# Patient Record
Sex: Female | Born: 1970 | Race: White | Hispanic: No | Marital: Married | Smoking: Never smoker
Health system: Southern US, Community
[De-identification: ages and names within clinical notes are randomized; demographics above are authoritative.]

## PROBLEM LIST (undated history)

## (undated) DIAGNOSIS — F3281 Premenstrual dysphoric disorder: Secondary | ICD-10-CM

## (undated) DIAGNOSIS — K589 Irritable bowel syndrome without diarrhea: Secondary | ICD-10-CM

## (undated) DIAGNOSIS — R928 Other abnormal and inconclusive findings on diagnostic imaging of breast: Secondary | ICD-10-CM

## (undated) DIAGNOSIS — R42 Dizziness and giddiness: Secondary | ICD-10-CM

## (undated) DIAGNOSIS — G43009 Migraine without aura, not intractable, without status migrainosus: Secondary | ICD-10-CM

## (undated) DIAGNOSIS — K219 Gastro-esophageal reflux disease without esophagitis: Secondary | ICD-10-CM

## (undated) DIAGNOSIS — R112 Nausea with vomiting, unspecified: Secondary | ICD-10-CM

## (undated) DIAGNOSIS — T7840XA Allergy, unspecified, initial encounter: Secondary | ICD-10-CM

## (undated) DIAGNOSIS — J302 Other seasonal allergic rhinitis: Secondary | ICD-10-CM

## (undated) DIAGNOSIS — N83209 Unspecified ovarian cyst, unspecified side: Secondary | ICD-10-CM

## (undated) DIAGNOSIS — H8109 Meniere's disease, unspecified ear: Secondary | ICD-10-CM

## (undated) DIAGNOSIS — R1013 Epigastric pain: Secondary | ICD-10-CM

## (undated) DIAGNOSIS — D509 Iron deficiency anemia, unspecified: Secondary | ICD-10-CM

## (undated) DIAGNOSIS — Z9889 Other specified postprocedural states: Secondary | ICD-10-CM

## (undated) HISTORY — DX: Premenstrual dysphoric disorder: F32.81

## (undated) HISTORY — DX: Migraine without aura, not intractable, without status migrainosus: G43.009

## (undated) HISTORY — PX: WISDOM TOOTH EXTRACTION: SHX21

## (undated) HISTORY — DX: Irritable bowel syndrome, unspecified: K58.9

## (undated) HISTORY — DX: Gastro-esophageal reflux disease without esophagitis: K21.9

## (undated) HISTORY — DX: Allergy, unspecified, initial encounter: T78.40XA

## (undated) HISTORY — DX: Dizziness and giddiness: R42

## (undated) HISTORY — PX: COLONOSCOPY: SHX174

## (undated) HISTORY — DX: Iron deficiency anemia, unspecified: D50.9

## (undated) HISTORY — DX: Unspecified ovarian cyst, unspecified side: N83.209

## (undated) HISTORY — PX: NASAL SINUS SURGERY: SHX719

## (undated) HISTORY — DX: Other seasonal allergic rhinitis: J30.2

## (undated) HISTORY — DX: Meniere's disease, unspecified ear: H81.09

---

## 1992-07-11 LAB — HM COLONOSCOPY

## 1995-07-12 HISTORY — PX: LEEP: SHX91

## 1999-07-12 DIAGNOSIS — H8109 Meniere's disease, unspecified ear: Secondary | ICD-10-CM

## 1999-07-12 HISTORY — DX: Meniere's disease, unspecified ear: H81.09

## 2002-07-11 HISTORY — PX: TUBAL LIGATION: SHX77

## 2004-07-11 HISTORY — PX: OTHER SURGICAL HISTORY: SHX169

## 2005-07-11 HISTORY — PX: MANDIBLE SURGERY: SHX707

## 2007-07-12 LAB — HIV ANTIBODY (ROUTINE TESTING W REFLEX): HIV 1&2 Ab, 4th Generation: NEGATIVE

## 2010-03-09 ENCOUNTER — Emergency Department (HOSPITAL_COMMUNITY)
Admission: EM | Admit: 2010-03-09 | Discharge: 2010-03-09 | Payer: Self-pay | Source: Home / Self Care | Admitting: Family Medicine

## 2010-05-06 ENCOUNTER — Emergency Department (HOSPITAL_COMMUNITY): Admission: EM | Admit: 2010-05-06 | Discharge: 2010-05-06 | Payer: Self-pay | Admitting: Family Medicine

## 2010-06-22 ENCOUNTER — Emergency Department (HOSPITAL_COMMUNITY)
Admission: EM | Admit: 2010-06-22 | Discharge: 2010-06-22 | Payer: Self-pay | Source: Home / Self Care | Admitting: Family Medicine

## 2010-07-15 ENCOUNTER — Emergency Department (HOSPITAL_COMMUNITY)
Admission: EM | Admit: 2010-07-15 | Discharge: 2010-07-15 | Payer: Self-pay | Source: Home / Self Care | Admitting: Family Medicine

## 2010-07-27 ENCOUNTER — Emergency Department (HOSPITAL_COMMUNITY)
Admission: EM | Admit: 2010-07-27 | Discharge: 2010-07-27 | Payer: Self-pay | Source: Home / Self Care | Admitting: Family Medicine

## 2010-09-24 LAB — CULTURE, ROUTINE-ABSCESS

## 2010-10-13 ENCOUNTER — Inpatient Hospital Stay (INDEPENDENT_AMBULATORY_CARE_PROVIDER_SITE_OTHER)
Admission: RE | Admit: 2010-10-13 | Discharge: 2010-10-13 | Disposition: A | Discharge status: Home / Self Care | Payer: Self-pay | Source: Ambulatory Visit | Attending: Emergency Medicine | Admitting: Emergency Medicine

## 2010-10-13 DIAGNOSIS — J019 Acute sinusitis, unspecified: Secondary | ICD-10-CM

## 2012-05-24 ENCOUNTER — Ambulatory Visit: Payer: Self-pay | Admitting: Family Medicine

## 2012-05-24 ENCOUNTER — Encounter: Payer: Self-pay | Admitting: Family Medicine

## 2012-05-24 ENCOUNTER — Ambulatory Visit (INDEPENDENT_AMBULATORY_CARE_PROVIDER_SITE_OTHER): Admitting: Family Medicine

## 2012-05-24 VITALS — BP 132/84 | HR 80 | Temp 97.5°F | Ht 65.0 in | Wt 139.0 lb

## 2012-05-24 DIAGNOSIS — K2970 Gastritis, unspecified, without bleeding: Secondary | ICD-10-CM

## 2012-05-24 DIAGNOSIS — Z23 Encounter for immunization: Secondary | ICD-10-CM

## 2012-05-24 DIAGNOSIS — R1013 Epigastric pain: Secondary | ICD-10-CM

## 2012-05-24 DIAGNOSIS — K219 Gastro-esophageal reflux disease without esophagitis: Secondary | ICD-10-CM

## 2012-05-24 DIAGNOSIS — K2990 Gastroduodenitis, unspecified, without bleeding: Secondary | ICD-10-CM

## 2012-05-24 MED ORDER — DEXLANSOPRAZOLE 60 MG PO CPDR: 60.0000 mg | DELAYED_RELEASE_CAPSULE | Freq: Every day | ORAL | Status: DC

## 2012-05-24 NOTE — Progress Notes (Signed)
Chief Complaint  Patient presents with  . Abdominal Pain    upper abdominal pain x 3 months. Eating anything spicy worsens this pain. Burning and painful-keeps her up at night. Sometimes she is doubled over in pain-nauseated.   HPI:  Started with upper abdominal pain about 3 months ago.  Started with pain during the day--didn't really seem to matter if she ate or not.  Subsequently started having more pain at night. Pain is described as a burning, cramping. There is associated nausea.  Pain has gotten worse in the past few weeks.  Only feels better if she gets on the floor and curls up into a ball, lies there until pain subsides.  She has taken a lot of Tums, sometimes it helps.  Denies heartburn.  Pain is above belly button, doesn't radiate elsewhere.  She had been using ibuprofen 600mg  twice daily for sinus pain, for months.  She thought this might be contributing, so cut back on taking ibuprofen, but no improvement in GI symptoms.   Denies any bloody or black BM's. Sometimes notices some mucus in stool, some constipation.  Pain is worse after tomato sauce, pizza.  She drinks a lot of orange-flavored water every day.  Past Medical History  Diagnosis Date  . Seasonal allergies   . PMDD (premenstrual dysphoric disorder)    Past Surgical History  Procedure Date  . Cesarean section     x2  . Nasal sinus surgery     x3  . Mandible surgery 2007  . Tubal ligation   . Wisdom tooth extraction   . Leep 1997   History   Social History  . Marital Status: Married    Spouse Name: N/A    Number of Children: 3  . Years of Education: N/A   Occupational History  . office assistant    Social History Main Topics  . Smoking status: Never Smoker   . Smokeless tobacco: Never Used  . Alcohol Use: No  . Drug Use: No  . Sexually Active: Yes -- Female partner(s)    Birth Control/ Protection: Surgical   Other Topics Concern  . Not on file   Social History Narrative   Divorced and re-married.   Lives with 3 children, husband.  No pets.  No tobacco exposure   Family History  Problem Relation Age of Onset  . Diabetes Mother   . Hypertension Mother   . Irritable bowel syndrome Mother   . Alzheimer's disease Father 72  . Hypertension Maternal Grandmother   . Hyperlipidemia Maternal Grandmother   . Stroke Maternal Grandfather   . Diabetes Paternal Grandmother   . Alzheimer's disease Paternal Grandmother   . Heart disease Paternal Grandfather 75    died of MI   Current outpatient prescriptions:BIOTIN PO, Take 3 capsules by mouth daily., Disp: , Rfl: ;  fexofenadine-pseudoephedrine (ALLEGRA-D 24) 180-240 MG per 24 hr tablet, Take 1 tablet by mouth daily., Disp: , Rfl: ;  fluticasone (FLONASE) 50 MCG/ACT nasal spray, Place 2 sprays into the nose daily., Disp: , Rfl: ;  ibuprofen (ADVIL,MOTRIN) 200 MG tablet, Take 200 mg by mouth every 6 (six) hours as needed., Disp: , Rfl:  Lactobacillus Rhamnosus, GG, (CULTURELLE PO), Take 1 capsule by mouth daily., Disp: , Rfl: ;  sertraline (ZOLOFT) 100 MG tablet, Take 100 mg by mouth daily., Disp: , Rfl:   Allergies  Allergen Reactions  . Sulfa Antibiotics Rash   ROS:  Denies fevers.  Just got over a sinus infection--currently denies pain,  purulence.  Denies sore throat, cough, shortness of breath, wheezing.  Denies chest pain, palpitations. Denies dysphagia. Denies radiation of abdominal pain.  Denies dysuria, vaginal discharge, joint pains. Denies bloody, or black stools, no mucus in stools.  No skin rashes or other concerns.  Moods controlled with current regimen  PHYSICAL EXAM: BP 132/84  Pulse 80  Temp 97.5 F (36.4 C) (Oral)  Ht 5\' 5"  (1.651 m)  Wt 139 lb (63.05 kg)  BMI 23.13 kg/m2  LMP 05/16/2012 Well developed, pleasant female in no distress HEENT: PERRL, conjunctiva clear.  OP clear Neck: no lymphadenopathy, thyromegaly or mass Heart: regular rate and rhythm without murmur Lungs: clear bilaterally Abdomen: soft, normal bowel  sounds.  Epigastric tenderness.  No hepatosplenomegaly or mass, negative Murphy sign. Extremities: no edema Neuro: alert and oriented, cranial nerves grossly intact.  Normal gait Skin: no rash Psych: normal mood, affect, hygiene and grooming  ASSESSMENT/PLAN:   1. Epigastric pain  dexlansoprazole (DEXILANT) 60 MG capsule  2. Need for prophylactic vaccination and inoculation against influenza  Flu vaccine greater than or equal to 3yo preservative free IM  3. Gastritis    4. GERD (gastroesophageal reflux disease)      Reviewed proper diet, reflux precautions. Stop NSAIDs, tylenol prn.  Reviewed if she is having sinus pain, to try sinus rinses, decongestants and tylenol rather than ibuprofen Given samples of Dexilant x 20 days.  Complete all samples, then try OTC Prilosec if symptoms recur after stopping med. If symptoms recur but aren't controlled with OTC meds, call for rx (and copay card) for dexilant (if it was effective).  F/u 2-3 weeks, sooner prn.  Schedule CPE

## 2012-05-24 NOTE — Patient Instructions (Signed)
Diet for Gastroesophageal Reflux Disease, Adult Reflux (acid reflux) is when acid from your stomach flows up into the esophagus. When acid comes in contact with the esophagus, the acid causes irritation and soreness (inflammation) in the esophagus. When reflux happens often or so severely that it causes damage to the esophagus, it is called gastroesophageal reflux disease (GERD). Nutrition therapy can help ease the discomfort of GERD. FOODS OR DRINKS TO AVOID OR LIMIT  Smoking or chewing tobacco. Nicotine is one of the most potent stimulants to acid production in the gastrointestinal tract.  Caffeinated and decaffeinated coffee and black tea.  Regular or low-calorie carbonated beverages or energy drinks (caffeine-free carbonated beverages are allowed).   Strong spices, such as black pepper, white pepper, red pepper, cayenne, curry powder, and chili powder.  Peppermint or spearmint.  Chocolate.  High-fat foods, including meats and fried foods. Extra added fats including oils, butter, salad dressings, and nuts. Limit these to less than 8 tsp per day.  Fruits and vegetables if they are not tolerated, such as citrus fruits or tomatoes.  Alcohol.  Any food that seems to aggravate your condition. If you have questions regarding your diet, call your caregiver or a registered dietitian. OTHER THINGS THAT MAY HELP GERD INCLUDE:   Eating your meals slowly, in a relaxed setting.  Eating 5 to 6 small meals per day instead of 3 large meals.  Eliminating food for a period of time if it causes distress.  Not lying down until 3 hours after eating a meal.  Keeping the head of your bed raised 6 to 9 inches (15 to 23 cm) by using a foam wedge or blocks under the legs of the bed. Lying flat may make symptoms worse.  Being physically active. Weight loss may be helpful in reducing reflux in overweight or obese adults.  Wear loose fitting clothing EXAMPLE MEAL PLAN This meal plan is approximately  2,000 calories based on https://www.bernard.org/ meal planning guidelines. Breakfast   cup cooked oatmeal.  1 cup strawberries.  1 cup low-fat milk.  1 oz almonds. Snack  1 cup cucumber slices.  6 oz yogurt (made from low-fat or fat-free milk). Lunch  2 slice whole-wheat bread.  2 oz sliced [place].  2 tsp mayonnaise.  1 cup blueberries.  1 cup snap peas. Snack  6 whole-wheat crackers.  1 oz string cheese. Dinner   cup brown rice.  1 cup mixed veggies.  1 tsp olive oil.  3 oz grilled fish. Document Released: 06/27/2005 Document Revised: 09/19/2011 Document Reviewed: 05/13/2011 [facility] Patient Information 2013 [place], [place].  Avoid anti-inflammatories. Take the samples every day. If your symptoms recur after completing the medication, you can try Prilosec OTC

## 2012-07-11 DIAGNOSIS — D509 Iron deficiency anemia, unspecified: Secondary | ICD-10-CM

## 2012-07-11 HISTORY — DX: Iron deficiency anemia, unspecified: D50.9

## 2012-08-10 ENCOUNTER — Encounter: Payer: Self-pay | Admitting: Internal Medicine

## 2012-08-28 ENCOUNTER — Encounter: Payer: Self-pay | Admitting: Family Medicine

## 2012-08-28 ENCOUNTER — Other Ambulatory Visit: Payer: Self-pay | Admitting: Family Medicine

## 2012-08-28 ENCOUNTER — Ambulatory Visit (INDEPENDENT_AMBULATORY_CARE_PROVIDER_SITE_OTHER): Admitting: Family Medicine

## 2012-08-28 VITALS — BP 120/78 | HR 72 | Ht 64.25 in | Wt 145.0 lb

## 2012-08-28 DIAGNOSIS — R1013 Epigastric pain: Secondary | ICD-10-CM

## 2012-08-28 DIAGNOSIS — Z13220 Encounter for screening for lipoid disorders: Secondary | ICD-10-CM

## 2012-08-28 DIAGNOSIS — N943 Premenstrual tension syndrome: Secondary | ICD-10-CM

## 2012-08-28 DIAGNOSIS — Z0000 Encounter for general adult medical examination without abnormal findings: Secondary | ICD-10-CM

## 2012-08-28 DIAGNOSIS — N3941 Urge incontinence: Secondary | ICD-10-CM

## 2012-08-28 DIAGNOSIS — F3281 Premenstrual dysphoric disorder: Secondary | ICD-10-CM | POA: Insufficient documentation

## 2012-08-28 DIAGNOSIS — R319 Hematuria, unspecified: Secondary | ICD-10-CM

## 2012-08-28 DIAGNOSIS — R5381 Other malaise: Secondary | ICD-10-CM

## 2012-08-28 DIAGNOSIS — R5383 Other fatigue: Secondary | ICD-10-CM

## 2012-08-28 LAB — POCT URINALYSIS DIPSTICK
Bilirubin, UA: NEGATIVE
Glucose, UA: NEGATIVE
Ketones, UA: NEGATIVE
Leukocytes, UA: NEGATIVE
Nitrite, UA: NEGATIVE

## 2012-08-28 LAB — CBC WITH DIFFERENTIAL/PLATELET
Eosinophils Relative: 1 % (ref 0–5)
HCT: 32.2 % — ABNORMAL LOW (ref 36.0–46.0)
Hemoglobin: 10.4 g/dL — ABNORMAL LOW (ref 12.0–15.0)
Lymphocytes Relative: 33 % (ref 12–46)
Lymphs Abs: 1.9 10*3/uL (ref 0.7–4.0)
MCV: 80.3 fL (ref 78.0–100.0)
Monocytes Absolute: 0.5 10*3/uL (ref 0.1–1.0)
Monocytes Relative: 8 % (ref 3–12)
RBC: 4.01 MIL/uL (ref 3.87–5.11)
WBC: 5.7 10*3/uL (ref 4.0–10.5)

## 2012-08-28 NOTE — Progress Notes (Signed)
Chief Complaint  Patient presents with  . Annual Exam    fasting annual exam had pap done 04/2012, would like switch over gyn care to Dr.Patriciaann Rabanal when she is due for next pap. Having night sweats on and off x several months. Weight gain. Still having IBS trouble. Mother and grandmother have diverticulitis-wants to know if there is anything she can do to prevent. UA showed trace blood, sometimes feels like she needs to go but then she get to the bathroom and does not have to go.   Lauren Blanchard is a 42 y.o. female who presents for a complete physical.  She has the following concerns:  R jaw pain since she had a crown done a few weeks ago.  Needing to take ibuprofen for the pain.   Epigastric pain--resolved, but needs to take OTC Prevacid daily.  Has some recurrent nausea if she doesn't take it. PMDD--irritability starts about 2 weeks before her cycles, even with her current medications.  Doesn't really want to increase dose of meds.  Health Maintenance: Immunization History  Administered Date(s) Administered  . 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  Has immunization records at home Last Pap smear: 04/2012 Last mammogram: in [place] 4-5 years Last colonoscopy: 1994 (as part of eval for IBS) Last DEXA: never Dentist: every 6 months Ophtho: never Exercise: None  Past Medical History  Diagnosis Date  . Seasonal allergies   . PMDD (premenstrual dysphoric disorder)   . IBS (irritable bowel syndrome)     constipation predominant    Past Surgical History  Procedure Laterality Date  . Cesarean section      x2  . Nasal sinus surgery      x3  . Mandible surgery  2007  . Tubal ligation    . Wisdom tooth extraction    . Leep  1997    History   Social History  . Marital Status: Married    Spouse Name: N/A    Number of Children: 3  . Years of Education: N/A   Occupational History  . office assistant    Social History Main Topics  . Smoking status: Never  Smoker   . Smokeless tobacco: Never Used  . Alcohol Use: No  . Drug Use: No  . Sexually Active: Yes -- Female partner(s)    Birth Control/ Protection: Surgical   Other Topics Concern  . Not on file   Social History Narrative   Divorced and re-married.  Lives with 3 children, husband.  No pets.  No tobacco exposure    Family History  Problem Relation Age of Onset  . Diabetes Mother   . Hypertension Mother   . Irritable bowel syndrome Mother   . Alzheimer's disease Father 55  . Hypertension Maternal Grandmother   . Hyperlipidemia Maternal Grandmother   . Stroke Maternal Grandfather   . Diabetes Paternal Grandmother   . Alzheimer's disease Paternal Grandmother   . Heart disease Paternal Grandfather 21    died of MI  . Turner syndrome Daughter     Current outpatient prescriptions:BIOTIN PO, Take 3 capsules by mouth daily., Disp: , Rfl: ;  fexofenadine-pseudoephedrine (ALLEGRA-D 24) 180-240 MG per 24 hr tablet, Take 1 tablet by mouth daily., Disp: , Rfl: ;  fluticasone (FLONASE) 50 MCG/ACT nasal spray, Place 2 sprays into the nose daily., Disp: , Rfl: ;  Lactobacillus Rhamnosus, GG, (CULTURELLE PO), Take 1 capsule by mouth daily., Disp: , Rfl:  lansoprazole (PREVACID) 15 MG capsule,  Take 15 mg by mouth daily., Disp: , Rfl: ;  sertraline (ZOLOFT) 100 MG tablet, Take 100 mg by mouth daily., Disp: , Rfl: ;  ibuprofen (ADVIL,MOTRIN) 200 MG tablet, Take 200 mg by mouth every 6 (six) hours as needed., Disp: , Rfl:   Allergies  Allergen Reactions  . Sulfa Antibiotics Rash   ROS:  The patient denies anorexia, fever, headaches,  vision changes, decreased hearing, ear pain, sore throat, breast concerns, chest pain, palpitations, dizziness, syncope, dyspnea on exertion, cough, swelling, vomiting, diarrhea, abdominal pain, melena, hematochezia, hematuria, dysuria, vaginal discharge, odor or itch, genital lesions, joint pains, numbness, tingling, weakness, tremor, suspicious skin lesions, abnormal  bleeding/bruising, or enlarged lymph nodes. Irritability/PMDD.  Menstrual cycles are somewhat irregular, heavy with clots.  Endometrial ablation has been suggested in past, she is considering. +night sweats, no hot flashes Nausea if misses prevacid.  + constipation, chronic Some mild urge incontinence +6 pound weight gain since last visit.  PHYSICAL EXAM: BP 120/78  Pulse 72  Ht 5' 4.25" (1.632 m)  Wt 145 lb (65.772 kg)  BMI 24.69 kg/m2  LMP 08/04/2012  General Appearance:    Alert, cooperative, no distress, appears stated age  Head:    Normocephalic, without obvious abnormality, atraumatic  Eyes:    PERRL, conjunctiva/corneas clear, EOM's intact, fundi    benign  Ears:    Normal TM's and external ear canals  Nose:   Nares normal, mucosa normal, no drainage or sinus   tenderness  Throat:   Lips, mucosa, and tongue normal; teeth and gums normal  Neck:   Supple, no lymphadenopathy;  thyroid:  no   enlargement/tenderness/nodules; no carotid   bruit or JVD  Back:    Spine nontender, no curvature, ROM normal, no CVA     tenderness  Lungs:     Clear to auscultation bilaterally without wheezes, rales or     ronchi; respirations unlabored  Chest Wall:    No tenderness or deformity   Heart:    Regular rate and rhythm, S1 and S2 normal, no murmur, rub   or gallop  Breast Exam:    Deferred to GYN  Abdomen:     Soft, non-tender, nondistended, normoactive bowel sounds,    no masses, no hepatosplenomegaly  Genitalia:    Deferred to GYN     Extremities:   No clubbing, cyanosis or edema  Pulses:   2+ and symmetric all extremities  Skin:   Skin color, texture, turgor normal, no rashes or lesions  Lymph nodes:   Cervical, supraclavicular, and axillary nodes normal  Neurologic:   CNII-XII intact, normal strength, sensation and gait; reflexes 2+ and symmetric throughout          Psych:   Normal mood, affect, hygiene and grooming.     ASSESSMENT/PLAN: Routine general medical examination at a  health care facility - Plan: Visual acuity screening, POCT Urinalysis Dipstick  Screening for lipoid disorders - Plan: Lipid panel  Other malaise and fatigue - Plan: Comprehensive metabolic panel, CBC with Differential, TSH, Vitamin D 25 hydroxy  Hematuria - Plan: Urine culture  Urge urinary incontinence - behavioral measures reviewed, as well as pelvic strengthening exercises.  consider meds if worse; not recommended now due to constipation at baseline.  r/o UTI - Plan: Urine culture  Epigastric pain - GERD--improved with prevacid. reviewed risks of untreated reflux vs longterm PPI use.  recommended she take MVI along with prevacid  PMDD (premenstrual dysphoric disorder) - suboptimally controlled.  suggested increasing sertraline  to 150mg  daily (she declines); discussed regular exercise, supplements for PMS (calcium, B vitamins)  Discussed monthly self breast exams and yearly mammograms after the age of 23; at least 30 minutes of aerobic activity at least 5 days/week; proper sunscreen use reviewed; healthy diet, including goals of calcium and vitamin D intake and alcohol recommendations (less than or equal to 1 drink/day) reviewed; regular seatbelt use; changing batteries in smoke detectors.  Immunization recommendations discussed-sounds UTD.  To get [place] copies of immunization record.  Colonoscopy recommendations reviewed--age 7.  F/u 1 year, sooner prn.  Can do breast/pelvic (full CPE) with next CPE, rather than returning in October for GYN exam

## 2012-08-28 NOTE — Patient Instructions (Addendum)
HEALTH MAINTENANCE RECOMMENDATIONS:  It is recommended that you get at least 30 minutes of aerobic exercise at least 5 days/week (for weight loss, you may need as much as 60-90 minutes). This can be any activity that gets your heart rate up. This can be divided in 10-15 minute intervals if needed, but try and build up your endurance at least once a week.  Weight bearing exercise is also recommended twice weekly.  Eat a healthy diet with lots of vegetables, fruits and fiber.  "Colorful" foods have a lot of vitamins (ie green vegetables, tomatoes, red peppers, etc).  Limit sweet tea, regular sodas and alcoholic beverages, all of which has a lot of calories and sugar.  Up to 1 alcoholic drink daily may be beneficial for women (unless trying to lose weight, watch sugars).  Drink a lot of water.  Calcium recommendations are 1200-1500 mg daily (1500 mg for postmenopausal women or women without ovaries), and vitamin D 1000 IU daily.  This should be obtained from diet and/or supplements (vitamins), and calcium should not be taken all at once, but in divided doses.  Monthly self breast exams and yearly mammograms for women over the age of 75 is recommended.  Sunscreen of at least SPF 30 should be used on all sun-exposed parts of the skin when outside between the hours of 10 am and 4 pm (not just when at beach or pool, but even with exercise, golf, tennis, and yard work!)  Use a sunscreen that says "broad spectrum" so it covers both UVA and UVB rays, and make sure to reapply every 1-2 hours.  Remember to change the batteries in your smoke detectors when changing your clock times in the spring and fall.  Use your seat belt every time you are in a car, and please drive safely and not be distracted with cell phones and texting while driving.  Please get [place] copies of your immunization records. Schedule routine eye exam and mammogram ([facility] recommend [facility] can call yourself to  schedule at your convenience).  Consider increasing zoloft to 150mg  daily (1.5 tablets) to help with moods.  I recommend doing this if a regular exercise routine and vitamins don't seem to help (look at health food store).

## 2012-08-29 LAB — COMPREHENSIVE METABOLIC PANEL
CO2: 25 mEq/L (ref 19–32)
Creat: 0.69 mg/dL (ref 0.50–1.10)
Glucose, Bld: 94 mg/dL (ref 70–99)
Total Bilirubin: 0.3 mg/dL (ref 0.3–1.2)

## 2012-08-29 LAB — LIPID PANEL
HDL: 76 mg/dL (ref >39)
Total CHOL/HDL Ratio: 2.9 Ratio
Triglycerides: 74 mg/dL (ref <150)

## 2012-08-30 ENCOUNTER — Encounter: Admitting: Family Medicine

## 2012-08-30 LAB — VITAMIN B12: Vitamin B-12: 595 pg/mL (ref 211–911)

## 2012-08-30 LAB — IRON: Iron: 28 ug/dL — ABNORMAL LOW (ref 42–145)

## 2012-08-31 ENCOUNTER — Other Ambulatory Visit: Payer: Self-pay

## 2012-08-31 DIAGNOSIS — D509 Iron deficiency anemia, unspecified: Secondary | ICD-10-CM

## 2012-08-31 LAB — URINE CULTURE: Colony Count: >=100000

## 2012-08-31 MED ORDER — NITROFURANTOIN MONOHYD MACRO 100 MG PO CAPS: 100.0000 mg | ORAL_CAPSULE | Freq: Two times a day (BID) | ORAL | Status: DC

## 2012-08-31 MED ORDER — VITAMIN D (ERGOCALCIFEROL) 1.25 MG (50000 UNIT) PO CAPS: 50000.0000 [IU] | ORAL_CAPSULE | ORAL | Status: DC

## 2012-08-31 NOTE — Progress Notes (Signed)
Quick Note:  PT WAS CONTACTED SHE WAS INFORMED OF ALL LABS ALSO OF MEDS CALLED IN ______

## 2012-08-31 NOTE — Telephone Encounter (Signed)
SENT MEDICATION IN PER KNAPP

## 2012-08-31 NOTE — Progress Notes (Signed)
Quick Note:  CALLED PT LEFT MESSAGE WORD FOR WORD( Advise pt--+UTI. rx macrobid 100mg  BID x 5 days. #10. Also, please ensure that Vitamin D was rx'd as in earlier result note (and advise of lipids, etc as mentioned in that note). Thanks LEFT THE REST OF MESSAGE FOR PT AND MADE SURE VIT D WAS SENT IN ______

## 2012-08-31 NOTE — Progress Notes (Signed)
Quick Note:  CALLED PT SHE SAID SHE STAYS CONSTIPATED AS IT IS I SUGGESTED THAT SHE TAKE METAMUCIL OR MIRA LAX TO SEE IF IT WOULD HELP WITH TAKEN IRON 325 MG BID PT AGREED AND HAS APT MARCH 28 TH FOR CBC AND FERRITIN ______

## 2012-09-08 DIAGNOSIS — R1013 Epigastric pain: Secondary | ICD-10-CM

## 2012-09-08 HISTORY — DX: Epigastric pain: R10.13

## 2012-10-05 ENCOUNTER — Other Ambulatory Visit

## 2012-10-05 DIAGNOSIS — D509 Iron deficiency anemia, unspecified: Secondary | ICD-10-CM

## 2012-10-05 LAB — CBC WITH DIFFERENTIAL/PLATELET
Eosinophils Absolute: 0.1 10*3/uL (ref 0.0–0.7)
Eosinophils Relative: 1 % (ref 0–5)
Lymphs Abs: 1.6 10*3/uL (ref 0.7–4.0)
MCH: 24.5 pg — ABNORMAL LOW (ref 26.0–34.0)
MCV: 78.9 fL (ref 78.0–100.0)
Platelets: 352 10*3/uL (ref 150–400)
RBC: 3.79 MIL/uL — ABNORMAL LOW (ref 3.87–5.11)

## 2012-10-10 ENCOUNTER — Encounter: Payer: Self-pay | Admitting: *Deleted

## 2012-10-17 ENCOUNTER — Encounter: Payer: Self-pay | Admitting: Family Medicine

## 2012-10-17 ENCOUNTER — Ambulatory Visit (INDEPENDENT_AMBULATORY_CARE_PROVIDER_SITE_OTHER): Admitting: Family Medicine

## 2012-10-17 VITALS — BP 118/72 | HR 84 | Ht 65.0 in | Wt 147.0 lb

## 2012-10-17 DIAGNOSIS — K589 Irritable bowel syndrome without diarrhea: Secondary | ICD-10-CM

## 2012-10-17 DIAGNOSIS — B001 Herpesviral vesicular dermatitis: Secondary | ICD-10-CM | POA: Insufficient documentation

## 2012-10-17 DIAGNOSIS — E559 Vitamin D deficiency, unspecified: Secondary | ICD-10-CM

## 2012-10-17 DIAGNOSIS — B009 Herpesviral infection, unspecified: Secondary | ICD-10-CM

## 2012-10-17 DIAGNOSIS — N920 Excessive and frequent menstruation with regular cycle: Secondary | ICD-10-CM

## 2012-10-17 DIAGNOSIS — D509 Iron deficiency anemia, unspecified: Secondary | ICD-10-CM | POA: Insufficient documentation

## 2012-10-17 MED ORDER — VALACYCLOVIR HCL 1 G PO TABS: ORAL_TABLET | ORAL | Status: DC

## 2012-10-17 MED ORDER — INTEGRA PLUS PO CAPS: 1.0000 | ORAL_CAPSULE | Freq: Every day | ORAL | Status: DC

## 2012-10-17 MED ORDER — LUBIPROSTONE 8 MCG PO CAPS: 8.0000 ug | ORAL_CAPSULE | Freq: Two times a day (BID) | ORAL | Status: DC

## 2012-10-17 NOTE — Progress Notes (Signed)
Chief Complaint  Patient presents with  . Follow-up    follow up on labs.   Patient was asked to come in today to discuss her worsening anemia.  She tried an OTC iron supplement, but after taking it for only 2 days, she had abdominal pain, worsening of constipation.  She has underlying IBS with constipation to begin with.  Gets out of breath more easily, noticed doing yardwork.  Felt weaker than usual. Menses are heavy.  She denies blood in stool.  She has constipation predominant IBS.  Miralax makes her too nauseated.  Culturelle seems to help with her bowels.  Zelnorm was very effective, but was taken off the market.  She recalls possibly having had nausea from amitiza also, can't recall which dose she took.  Denies any bleeding other than vaginal. No bruising.  No abdominal pain.  She has h/o fever blisters.  Feels tingling now, feels like it is coming on. Asking for prescription.  Previously took Valtrex.  Past Medical History  Diagnosis Date  . Seasonal allergies   . PMDD (premenstrual dysphoric disorder)   . IBS (irritable bowel syndrome)     constipation predominant  . Iron deficiency anemia 2014   Past Surgical History  Procedure Laterality Date  . Cesarean section      x2  . Nasal sinus surgery      x3  . Mandible surgery  2007  . Tubal ligation    . Wisdom tooth extraction    . Leep  1997   History   Social History  . Marital Status: Married    Spouse Name: N/A    Number of Children: 3  . Years of Education: N/A   Occupational History  . office assistant    Social History Main Topics  . Smoking status: Never Smoker   . Smokeless tobacco: Never Used  . Alcohol Use: No  . Drug Use: No  . Sexually Active: Yes -- Female partner(s)    Birth Control/ Protection: Surgical   Other Topics Concern  . Not on file   Social History Narrative   Divorced and re-married.  Lives with 3 children, husband.  No pets.  No tobacco exposure   Current Outpatient Prescriptions  on File Prior to Visit  Medication Sig Dispense Refill  . BIOTIN PO Take 3 capsules by mouth daily.      . fexofenadine-pseudoephedrine (ALLEGRA-D 24) 180-240 MG per 24 hr tablet Take 1 tablet by mouth daily.      . fluticasone (FLONASE) 50 MCG/ACT nasal spray Place 2 sprays into the nose daily.      . Lactobacillus Rhamnosus, GG, (CULTURELLE PO) Take 1 capsule by mouth daily.      . lansoprazole (PREVACID) 15 MG capsule Take 15 mg by mouth daily.      . sertraline (ZOLOFT) 100 MG tablet Take 100 mg by mouth daily.      . Vitamin D, Ergocalciferol, (DRISDOL) 50000 UNITS CAPS Take 1 capsule (50,000 Units total) by mouth every 7 (seven) days. AFTER 12 WEEKS START TAKING 1000 IU VITAMIN D3 OTC  12 capsule  0  . ibuprofen (ADVIL,MOTRIN) 200 MG tablet Take 200 mg by mouth every 6 (six) hours as needed.       No current facility-administered medications on file prior to visit.   Allergies  Allergen Reactions  . Sulfa Antibiotics Rash   ROS:  Denies fevers, URI symptoms, cough, heartburn, chest pain, abdominal pain, blood in stool, urinary complaints.  +fatigue.  No syncope/pre-syncope.  +heavy periods.  +cold sores.  See HPI  PHYSICAL EXAM: BP 118/72  Pulse 84  Ht 5\' 5"  (1.651 m)  Wt 147 lb (66.679 kg)  BMI 24.46 kg/m2  LMP 09/28/2012 Pleasant female in no distress Neck: no lymphadenopathy or mass Heart: regular rate and rhythm Lungs: clear bilaterally Abdomen: soft, nontender, normal bowel sounds Extremities: no edema Skin: no rash, bleeding/bruising, no cold sores Psych: normal mood, affect, hygiene and grooming   Lab Results  Component Value Date   WBC 5.9 10/05/2012   HGB 9.3* 10/05/2012   HCT 29.9* 10/05/2012   MCV 78.9 10/05/2012   PLT 352 10/05/2012   Lab Results  Component Value Date   FERRITIN 2* 10/05/2012   ASSESSMENT/PLAN: Anemia, iron deficiency - Plan: FeFum-FePoly-FA-B Cmp-C-Biot (INTEGRA PLUS) CAPS  Menorrhagia  Herpes labialis - Plan: valACYclovir (VALTREX)  1000 MG tablet  IBS (irritable bowel syndrome) - Plan: lubiprostone (AMITIZA) 8 MCG capsule  Unspecified vitamin D deficiency  Reviewed differential diagnosis of anemia.  Most likely related to menstrual cycles, but eval for GI contribution.  Hemoccult kit given.  F/u with GYN re: heavy periods, consider ablation (vs OCP's) to regulate cycles  Integra Plus rx written--perhaps she will tolerate this better.  Needs to be more proactive in treating constipation--suggested Colace, and also trial of amitiza Amitiza samples to retry BID.  Savings card given.  Call for rx if tolerating/helping.  High iron foods reviewed, list given.  Herpes labialis.  Reviewed proper way to take meds prn (2 gm x 2 doses 12 hrs apart).  Refill given.  F/u 2 months--labs only.  CBC, ferritin and Vitamin D  Counseled to CALL us if not tolerating meds.  Reviewed natural course of worsening symptoms--encouraged her to try and take iron, be compliant with meds.  If intolerant of iron, may ultimately need either iron infusion or  Transfusion  25 min visit, more than 1/2 spent counseling.

## 2012-10-17 NOTE — Patient Instructions (Signed)
Please follow up with your gynecologist regarding heavy bleeding which is contributing to your anemia.  Take the Integra (iron supplement) and work on treating the constipation.  I also recommend that you take daily stool softener such as Colace. Please send in the stool cards you were given, in order to help [place] rule out blood loss from your GI tract (likely is just menstrual losses).  Iron-Rich Diet An iron-rich diet contains foods that are good sources of iron. Iron is an important mineral that helps your body produce hemoglobin. Hemoglobin is a protein in red blood cells that carries oxygen to the body's tissues. Sometimes, the iron level in your blood can be low. This may be caused by:  A lack of iron in your diet.  Blood loss.  Times of growth, such as during pregnancy or during a child's growth and development. Low levels of iron can cause a decrease in the number of red blood cells. This can result in iron deficiency anemia. Iron deficiency anemia symptoms include:  Tiredness.  Weakness.  Irritability.  Increased chance of infection. Here are some recommendations for daily iron intake:  Males older than 44 years of age need 8 mg of iron per day.  Women ages 31 to 67 need 18 mg of iron per day.  Pregnant women need 27 mg of iron per day, and women who are over 37 years of age and breastfeeding need 9 mg of iron per day.  Women over the age of 77 need 8 mg of iron per day. SOURCES OF IRON There are 2 types of iron that are found in food: heme iron and nonheme iron. Heme iron is absorbed by the body better than nonheme iron. Heme iron is found in meat, poultry, and fish. Nonheme iron is found in grains, beans, and vegetables. Heme Iron Sources Food / Iron (mg)  Chicken liver, 3 oz (85 g)/ 10 mg  Beef liver, 3 oz (85 g)/ 5.5 mg  Oysters, 3 oz (85 g)/ 8 mg  Beef, 3 oz (85 g)/ 2 to 3 mg  Shrimp, 3 oz (85 g)/ 2.8 mg  [place], 3 oz (85 g)/ 2 mg  Chicken, 3 oz (85 g) / 1  mg  Fish (tuna, halibut), 3 oz (85 g)/ 1 mg  Pork, 3 oz (85 g)/ 0.9 mg Nonheme Iron Sources Food / Iron (mg)  Ready-to-eat breakfast cereal, iron-fortified / 3.9 to 7 mg  Tofu,  cup / 3.4 mg  Kidney beans,  cup / 2.6 mg  Baked potato with skin / 2.7 mg  Asparagus,  cup / 2.2 mg  Avocado / 2 mg  Dried peaches,  cup / 1.6 mg  Raisins,  cup / 1.5 mg  Soy milk, 1 cup / 1.5 mg  Whole-wheat bread, 1 slice / 1.2 mg  Spinach, 1 cup / 0.8 mg  Broccoli,  cup / 0.6 mg IRON ABSORPTION Certain foods can decrease the body's absorption of iron. Try to avoid these foods and beverages while eating meals with iron-containing foods:  Coffee.  Tea.  Fiber.  Soy. Foods containing vitamin C can help increase the amount of iron your body absorbs from iron sources, especially from nonheme sources. Eat foods with vitamin C along with iron-containing foods to increase your iron absorption. Foods that are high in vitamin C include many fruits and vegetables. Some good sources are:  Fresh orange juice.  Oranges.  Strawberries.  Mangoes.  Grapefruit.  Red bell peppers.  Green bell peppers.  Broccoli.  Potatoes with skin.  Tomato juice. Document Released: 02/08/2005 Document Revised: 09/19/2011 Document Reviewed: 12/16/2010 [facility] Patient Information 2013 [employer], [place].

## 2012-10-18 ENCOUNTER — Encounter: Payer: Self-pay | Admitting: Family Medicine

## 2012-11-07 ENCOUNTER — Telehealth: Payer: Self-pay | Admitting: Family Medicine

## 2012-11-07 DIAGNOSIS — K589 Irritable bowel syndrome without diarrhea: Secondary | ICD-10-CM

## 2012-11-07 MED ORDER — LUBIPROSTONE 8 MCG PO CAPS: 8.0000 ug | ORAL_CAPSULE | Freq: Two times a day (BID) | ORAL | Status: DC

## 2012-11-07 NOTE — Telephone Encounter (Signed)
Patient called and she was seen about a month ago and you gave her samples of Amitiza (she believes it was 8 mg) and she would like Rx  [facility]  In [place]

## 2012-11-07 NOTE — Telephone Encounter (Signed)
Done x 2 mos (labs due in another month from now)

## 2012-11-08 ENCOUNTER — Other Ambulatory Visit: Payer: Self-pay | Admitting: *Deleted

## 2012-11-08 DIAGNOSIS — K589 Irritable bowel syndrome without diarrhea: Secondary | ICD-10-CM

## 2012-11-08 MED ORDER — LUBIPROSTONE 8 MCG PO CAPS: 8.0000 ug | ORAL_CAPSULE | Freq: Two times a day (BID) | ORAL | Status: DC

## 2012-11-14 ENCOUNTER — Telehealth: Payer: Self-pay | Admitting: *Deleted

## 2012-11-14 DIAGNOSIS — N390 Urinary tract infection, site not specified: Secondary | ICD-10-CM

## 2012-11-14 MED ORDER — AMOXICILLIN 875 MG PO TABS: 875.0000 mg | ORAL_TABLET | Freq: Two times a day (BID) | ORAL | Status: DC

## 2012-11-14 NOTE — Telephone Encounter (Signed)
Patient called in and said that she was treated for UTI about a month ago, cleared up for a week or so but her symptoms are back. I looked in the computer and it was actually in Feb 2014. She wanted to know if you could call her in abx to [facility] in [place]. I told her you would probably want to see her. Please advise, thanks.

## 2012-11-14 NOTE — Telephone Encounter (Signed)
Her symptoms back in Feb were simply urgency, with some urge incontinence.  Ensure that she isn't having fevers, flank pain, n/v or vaginal discharge.  If she has any of those symptoms, then OV is needed.  If just recurrent symptoms as in Feb, then ok to treat with amoxacillin 875 mg BID x 5 days.  She must return after antibiotics if symptoms haven't resolved.  We treated with macrobid last time, she is allergic to sulfa, and it was resistant to cipro

## 2012-11-14 NOTE — Telephone Encounter (Signed)
Spoke with patient she is not having any flank pain, fevers or vaginal discharge. Called in abx to [facility] and she will schedule OV of symptoms persist after abx course.

## 2012-12-12 ENCOUNTER — Telehealth: Payer: Self-pay | Admitting: *Deleted

## 2012-12-12 DIAGNOSIS — K589 Irritable bowel syndrome without diarrhea: Secondary | ICD-10-CM

## 2012-12-12 MED ORDER — LUBIPROSTONE 24 MCG PO CAPS: 24.0000 ug | ORAL_CAPSULE | Freq: Two times a day (BID) | ORAL | Status: DC

## 2012-12-12 NOTE — Telephone Encounter (Signed)
Would like to know if you can increase her amitiza a higher dosage, she thinks you told her 16mg . The 8mg  is working but not enough. Uses [facility] [place].

## 2012-12-12 NOTE — Telephone Encounter (Signed)
Patient advised.

## 2012-12-12 NOTE — Telephone Encounter (Signed)
rx was sent.  Next dose up is 24 mcg (not 16)

## 2012-12-18 ENCOUNTER — Other Ambulatory Visit

## 2013-01-04 ENCOUNTER — Other Ambulatory Visit: Payer: Self-pay | Admitting: Obstetrics and Gynecology

## 2013-01-04 ENCOUNTER — Encounter (HOSPITAL_COMMUNITY): Payer: Self-pay | Admitting: Pharmacist

## 2013-01-15 ENCOUNTER — Encounter (HOSPITAL_COMMUNITY): Payer: Self-pay

## 2013-01-15 ENCOUNTER — Encounter (HOSPITAL_COMMUNITY)
Admission: RE | Admit: 2013-01-15 | Discharge: 2013-01-15 | Disposition: A | Discharge status: Home / Self Care | Source: Ambulatory Visit | Attending: Obstetrics and Gynecology | Admitting: Obstetrics and Gynecology

## 2013-01-15 HISTORY — DX: Nausea with vomiting, unspecified: Z98.890

## 2013-01-15 HISTORY — DX: Epigastric pain: R10.13

## 2013-01-15 HISTORY — DX: Nausea with vomiting, unspecified: R11.2

## 2013-01-15 LAB — CBC
MCV: 80.5 fL (ref 78.0–100.0)
Platelets: 276 10*3/uL (ref 150–400)
RBC: 4.06 MIL/uL (ref 3.87–5.11)
RDW: 17.8 % — ABNORMAL HIGH (ref 11.5–15.5)
WBC: 5.3 10*3/uL (ref 4.0–10.5)

## 2013-01-15 NOTE — Patient Instructions (Addendum)
   Your procedure is scheduled on: Thursday, July 10th Enter through the Main Entrance of [facility] at: 11:30 am  Pick up the phone at the desk and dial [phone] and inform us of your arrival.  Please call this number if you have any problems the morning of surgery: [phone]  Remember: Do not drink any liquids or eat any solid foods after midnight on: Thursday  Please take these medications morning of surgery: Prevacid, Zoloft,Allegra D, Flonase, Culturelle,and Visine eye drops  Discuss with Surgeon and Anesthesia staff any concerns about receiving a blood transfusion when in pre-op area.  Do not wear jewelry, make-up, or FINGER nail polish No metal in your hair or on your body. Do not wear lotions, powders, perfumes. You may wear deodorant.  Please use your CHG wash as directed prior to surgery.  Do not shave anywhere for at least 12 hours prior to first CHG shower.  Do not bring valuables to the hospital. Contacts, Dentures and Partial Plates may not be worn to OR  Leave suitcase in the car. After Surgery it may be brought to your room.  For patients being admitted to the hospital, checkout time is 11:00am the day of discharge.   For patients being discharged-you must have a ride home.

## 2013-01-17 ENCOUNTER — Ambulatory Visit (HOSPITAL_COMMUNITY): Admitting: Anesthesiology

## 2013-01-17 ENCOUNTER — Encounter (HOSPITAL_COMMUNITY): Payer: Self-pay | Admitting: Anesthesiology

## 2013-01-17 ENCOUNTER — Encounter (HOSPITAL_COMMUNITY): Payer: Self-pay | Admitting: *Deleted

## 2013-01-17 ENCOUNTER — Encounter (HOSPITAL_COMMUNITY)
Admission: RE | Disposition: A | Discharge status: Home / Self Care | Payer: Self-pay | Source: Ambulatory Visit | Attending: Obstetrics and Gynecology

## 2013-01-17 ENCOUNTER — Observation Stay (HOSPITAL_COMMUNITY)
Admission: RE | Admit: 2013-01-17 | Discharge: 2013-01-18 | Disposition: A | Discharge status: Home / Self Care | Source: Ambulatory Visit | Attending: Obstetrics and Gynecology | Admitting: Obstetrics and Gynecology

## 2013-01-17 DIAGNOSIS — D509 Iron deficiency anemia, unspecified: Secondary | ICD-10-CM | POA: Insufficient documentation

## 2013-01-17 DIAGNOSIS — Z90710 Acquired absence of both cervix and uterus: Secondary | ICD-10-CM

## 2013-01-17 DIAGNOSIS — N83209 Unspecified ovarian cyst, unspecified side: Secondary | ICD-10-CM | POA: Insufficient documentation

## 2013-01-17 DIAGNOSIS — N838 Other noninflammatory disorders of ovary, fallopian tube and broad ligament: Secondary | ICD-10-CM | POA: Insufficient documentation

## 2013-01-17 DIAGNOSIS — N920 Excessive and frequent menstruation with regular cycle: Principal | ICD-10-CM | POA: Insufficient documentation

## 2013-01-17 HISTORY — PX: BILATERAL SALPINGECTOMY: SHX5743

## 2013-01-17 HISTORY — PX: CYSTOSCOPY: SHX5120

## 2013-01-17 HISTORY — PX: ROBOTIC ASSISTED TOTAL HYSTERECTOMY: SHX6085

## 2013-01-17 SURGERY — ROBOTIC ASSISTED TOTAL HYSTERECTOMY
Anesthesia: General | Site: Bladder

## 2013-01-17 MED ORDER — BUPIVACAINE HCL (PF) 0.25 % IJ SOLN
INTRAMUSCULAR | Status: AC
  Filled 2013-01-17: qty 30

## 2013-01-17 MED ORDER — LIDOCAINE HCL (CARDIAC) 20 MG/ML IV SOLN
INTRAVENOUS | Status: DC | PRN
  Administered 2013-01-17: 50 mg via INTRAVENOUS

## 2013-01-17 MED ORDER — ONDANSETRON HCL 4 MG/2ML IJ SOLN
INTRAMUSCULAR | Status: AC
  Filled 2013-01-17: qty 2

## 2013-01-17 MED ORDER — LACTATED RINGERS IV SOLN
INTRAVENOUS | Status: DC
  Administered 2013-01-17 (×4): via INTRAVENOUS

## 2013-01-17 MED ORDER — ACETAMINOPHEN 10 MG/ML IV SOLN
INTRAVENOUS | Status: AC
  Filled 2013-01-17: qty 100

## 2013-01-17 MED ORDER — PROPOFOL 10 MG/ML IV EMUL
INTRAVENOUS | Status: AC
  Filled 2013-01-17: qty 20

## 2013-01-17 MED ORDER — NEOSTIGMINE METHYLSULFATE 1 MG/ML IJ SOLN
INTRAMUSCULAR | Status: DC | PRN
  Administered 2013-01-17: 1 mg via INTRAVENOUS
  Administered 2013-01-17: 2 mg via INTRAVENOUS

## 2013-01-17 MED ORDER — CEFAZOLIN SODIUM-DEXTROSE 2-3 GM-% IV SOLR
INTRAVENOUS | Status: AC
  Filled 2013-01-17: qty 50

## 2013-01-17 MED ORDER — PANTOPRAZOLE SODIUM 20 MG PO TBEC
20.0000 mg | DELAYED_RELEASE_TABLET | Freq: Every day | ORAL | Status: DC
  Administered 2013-01-18: 20 mg via ORAL
  Filled 2013-01-17 (×2): qty 1

## 2013-01-17 MED ORDER — METOCLOPRAMIDE HCL 5 MG/ML IJ SOLN: 10.0000 mg | Freq: Once | INTRAMUSCULAR | Status: DC | PRN

## 2013-01-17 MED ORDER — FENTANYL CITRATE 0.05 MG/ML IJ SOLN
INTRAMUSCULAR | Status: AC
  Filled 2013-01-17: qty 5

## 2013-01-17 MED ORDER — CEFAZOLIN SODIUM-DEXTROSE 2-3 GM-% IV SOLR
2.0000 g | INTRAVENOUS | Status: AC
  Administered 2013-01-17: 2 g via INTRAVENOUS

## 2013-01-17 MED ORDER — GLYCOPYRROLATE 0.2 MG/ML IJ SOLN
INTRAMUSCULAR | Status: AC
  Filled 2013-01-17: qty 1

## 2013-01-17 MED ORDER — INDIGOTINDISULFONATE SODIUM 8 MG/ML IJ SOLN
INTRAMUSCULAR | Status: DC | PRN
  Administered 2013-01-17: 5 mL via INTRAVENOUS

## 2013-01-17 MED ORDER — MIDAZOLAM HCL 2 MG/2ML IJ SOLN
INTRAMUSCULAR | Status: AC
  Filled 2013-01-17: qty 2

## 2013-01-17 MED ORDER — INDIGOTINDISULFONATE SODIUM 8 MG/ML IJ SOLN
INTRAMUSCULAR | Status: AC
  Filled 2013-01-17: qty 5

## 2013-01-17 MED ORDER — STERILE WATER FOR IRRIGATION IR SOLN
Status: DC | PRN
  Administered 2013-01-17: 1000 mL

## 2013-01-17 MED ORDER — PROPOFOL 10 MG/ML IV BOLUS
INTRAVENOUS | Status: DC | PRN
  Administered 2013-01-17: 150 mg via INTRAVENOUS

## 2013-01-17 MED ORDER — FENTANYL CITRATE 0.05 MG/ML IJ SOLN
INTRAMUSCULAR | Status: AC
  Administered 2013-01-17: 50 ug via INTRAVENOUS
  Filled 2013-01-17: qty 2

## 2013-01-17 MED ORDER — GLYCOPYRROLATE 0.2 MG/ML IJ SOLN
INTRAMUSCULAR | Status: AC
  Filled 2013-01-17: qty 2

## 2013-01-17 MED ORDER — SERTRALINE HCL 100 MG PO TABS
100.0000 mg | ORAL_TABLET | Freq: Every day | ORAL | Status: DC
  Administered 2013-01-17: 100 mg via ORAL
  Filled 2013-01-17 (×3): qty 1

## 2013-01-17 MED ORDER — DEXAMETHASONE SODIUM PHOSPHATE 10 MG/ML IJ SOLN
INTRAMUSCULAR | Status: AC
  Filled 2013-01-17: qty 1

## 2013-01-17 MED ORDER — SCOPOLAMINE 1 MG/3DAYS TD PT72
MEDICATED_PATCH | TRANSDERMAL | Status: AC
  Administered 2013-01-17: 1.5 mg via TRANSDERMAL
  Filled 2013-01-17: qty 1

## 2013-01-17 MED ORDER — ROCURONIUM BROMIDE 50 MG/5ML IV SOLN
INTRAVENOUS | Status: AC
  Filled 2013-01-17: qty 1

## 2013-01-17 MED ORDER — LACTATED RINGERS IR SOLN
Status: DC | PRN
  Administered 2013-01-17: 3000 mL

## 2013-01-17 MED ORDER — ONDANSETRON HCL 4 MG/2ML IJ SOLN
4.0000 mg | Freq: Four times a day (QID) | INTRAMUSCULAR | Status: DC | PRN
  Administered 2013-01-17 – 2013-01-18 (×2): 4 mg via INTRAVENOUS
  Filled 2013-01-17 (×2): qty 2

## 2013-01-17 MED ORDER — EPHEDRINE 5 MG/ML INJ
INTRAVENOUS | Status: AC
  Filled 2013-01-17: qty 10

## 2013-01-17 MED ORDER — BUPIVACAINE HCL (PF) 0.25 % IJ SOLN
INTRAMUSCULAR | Status: DC | PRN
  Administered 2013-01-17: 2 mL

## 2013-01-17 MED ORDER — IBUPROFEN 600 MG PO TABS
600.0000 mg | ORAL_TABLET | Freq: Four times a day (QID) | ORAL | Status: DC | PRN
  Administered 2013-01-17 – 2013-01-18 (×2): 600 mg via ORAL
  Filled 2013-01-17 (×2): qty 1

## 2013-01-17 MED ORDER — KETOROLAC TROMETHAMINE 30 MG/ML IJ SOLN: 15.0000 mg | Freq: Once | INTRAMUSCULAR | Status: DC | PRN

## 2013-01-17 MED ORDER — ACETAMINOPHEN 10 MG/ML IV SOLN
1000.0000 mg | Freq: Once | INTRAVENOUS | Status: AC
  Administered 2013-01-17: 1000 mg via INTRAVENOUS

## 2013-01-17 MED ORDER — FENTANYL CITRATE 0.05 MG/ML IJ SOLN
INTRAMUSCULAR | Status: DC | PRN
  Administered 2013-01-17: 50 ug via INTRAVENOUS
  Administered 2013-01-17: 100 ug via INTRAVENOUS
  Administered 2013-01-17 (×4): 50 ug via INTRAVENOUS
  Administered 2013-01-17: 100 ug via INTRAVENOUS

## 2013-01-17 MED ORDER — SIMETHICONE 80 MG PO CHEW
80.0000 mg | CHEWABLE_TABLET | Freq: Four times a day (QID) | ORAL | Status: DC | PRN
  Administered 2013-01-17: 80 mg via ORAL

## 2013-01-17 MED ORDER — DEXAMETHASONE SODIUM PHOSPHATE 10 MG/ML IJ SOLN
INTRAMUSCULAR | Status: DC | PRN
  Administered 2013-01-17: 10 mg via INTRAVENOUS

## 2013-01-17 MED ORDER — EPHEDRINE SULFATE 50 MG/ML IJ SOLN
INTRAMUSCULAR | Status: DC | PRN
  Administered 2013-01-17 (×2): 10 mg via INTRAVENOUS

## 2013-01-17 MED ORDER — LIDOCAINE HCL (CARDIAC) 20 MG/ML IV SOLN
INTRAVENOUS | Status: AC
  Filled 2013-01-17: qty 5

## 2013-01-17 MED ORDER — ONDANSETRON HCL 4 MG/2ML IJ SOLN
INTRAMUSCULAR | Status: DC | PRN
  Administered 2013-01-17: 4 mg via INTRAVENOUS

## 2013-01-17 MED ORDER — NEOSTIGMINE METHYLSULFATE 1 MG/ML IJ SOLN
INTRAMUSCULAR | Status: AC
  Filled 2013-01-17: qty 1

## 2013-01-17 MED ORDER — ZOLPIDEM TARTRATE 5 MG PO TABS: 5.0000 mg | ORAL_TABLET | Freq: Every evening | ORAL | Status: DC | PRN

## 2013-01-17 MED ORDER — GLYCOPYRROLATE 0.2 MG/ML IJ SOLN
INTRAMUSCULAR | Status: DC | PRN
  Administered 2013-01-17: 0.4 mg via INTRAVENOUS
  Administered 2013-01-17: 0.2 mg via INTRAVENOUS

## 2013-01-17 MED ORDER — OXYCODONE-ACETAMINOPHEN 5-325 MG PO TABS
1.0000 | ORAL_TABLET | ORAL | Status: DC | PRN
  Administered 2013-01-17: 1 via ORAL
  Administered 2013-01-18: 2 via ORAL
  Filled 2013-01-17: qty 2
  Filled 2013-01-17: qty 1

## 2013-01-17 MED ORDER — DOCUSATE SODIUM 100 MG PO CAPS
100.0000 mg | ORAL_CAPSULE | Freq: Two times a day (BID) | ORAL | Status: DC
  Administered 2013-01-17 – 2013-01-18 (×2): 100 mg via ORAL
  Filled 2013-01-17 (×2): qty 1

## 2013-01-17 MED ORDER — ONDANSETRON HCL 4 MG PO TABS: 4.0000 mg | ORAL_TABLET | Freq: Four times a day (QID) | ORAL | Status: DC | PRN

## 2013-01-17 MED ORDER — MIDAZOLAM HCL 5 MG/5ML IJ SOLN
INTRAMUSCULAR | Status: DC | PRN
  Administered 2013-01-17: 2 mg via INTRAVENOUS

## 2013-01-17 MED ORDER — SCOPOLAMINE 1 MG/3DAYS TD PT72: 1.0000 | MEDICATED_PATCH | Freq: Once | TRANSDERMAL | Status: DC | PRN

## 2013-01-17 MED ORDER — FENTANYL CITRATE 0.05 MG/ML IJ SOLN
25.0000 ug | INTRAMUSCULAR | Status: DC | PRN
  Administered 2013-01-17: 50 ug via INTRAVENOUS

## 2013-01-17 MED ORDER — ROCURONIUM BROMIDE 100 MG/10ML IV SOLN
INTRAVENOUS | Status: DC | PRN
  Administered 2013-01-17: 10 mg via INTRAVENOUS
  Administered 2013-01-17: 20 mg via INTRAVENOUS
  Administered 2013-01-17: 50 mg via INTRAVENOUS
  Administered 2013-01-17: 5 mg via INTRAVENOUS

## 2013-01-17 MED ORDER — KETOROLAC TROMETHAMINE 30 MG/ML IJ SOLN
INTRAMUSCULAR | Status: DC | PRN
  Administered 2013-01-17: 30 mg via INTRAVENOUS

## 2013-01-17 SURGICAL SUPPLY — 58 items
BAG URINE DRAINAGE (UROLOGICAL SUPPLIES) ×4 IMPLANT
BARRIER ADHS 3X4 INTERCEED (GAUZE/BANDAGES/DRESSINGS) ×4 IMPLANT
BENZOIN TINCTURE PRP APPL 2/3 (GAUZE/BANDAGES/DRESSINGS) ×4 IMPLANT
CATH FOLEY 3WAY  5CC 16FR (CATHETERS) ×1
CATH FOLEY 3WAY 5CC 16FR (CATHETERS) ×3 IMPLANT
CHLORAPREP W/TINT 26ML (MISCELLANEOUS) ×4 IMPLANT
CLOTH BEACON ORANGE TIMEOUT ST (SAFETY) ×4 IMPLANT
CONT PATH 16OZ SNAP LID 3702 (MISCELLANEOUS) ×4 IMPLANT
COVER MAYO STAND STRL (DRAPES) ×4 IMPLANT
COVER TABLE BACK 60X90 (DRAPES) ×8 IMPLANT
COVER TIP SHEARS 8 DVNC (MISCELLANEOUS) ×3 IMPLANT
COVER TIP SHEARS 8MM DA VINCI (MISCELLANEOUS) ×1
DECANTER SPIKE VIAL GLASS SM (MISCELLANEOUS) ×4 IMPLANT
DERMABOND ADVANCED (GAUZE/BANDAGES/DRESSINGS) ×1
DERMABOND ADVANCED .7 DNX12 (GAUZE/BANDAGES/DRESSINGS) ×3 IMPLANT
DRAPE HUG U DISPOSABLE (DRAPE) ×4 IMPLANT
DRAPE LG THREE QUARTER DISP (DRAPES) ×8 IMPLANT
DRAPE WARM FLUID 44X44 (DRAPE) ×4 IMPLANT
ELECT REM PT RETURN 9FT ADLT (ELECTROSURGICAL) ×4
ELECTRODE REM PT RTRN 9FT ADLT (ELECTROSURGICAL) ×3 IMPLANT
EVACUATOR SMOKE 8.L (FILTER) ×4 IMPLANT
GAUZE VASELINE 3X9 (GAUZE/BANDAGES/DRESSINGS) IMPLANT
GLOVE BIO SURGEON STRL SZ7 (GLOVE) ×20 IMPLANT
GLOVE ECLIPSE 6.5 STRL STRAW (GLOVE) ×20 IMPLANT
GOWN STRL REIN XL XLG (GOWN DISPOSABLE) ×24 IMPLANT
KIT ACCESSORY DA VINCI DISP (KITS) ×1
KIT ACCESSORY DVNC DISP (KITS) ×3 IMPLANT
LEGGING LITHOTOMY PAIR STRL (DRAPES) ×4 IMPLANT
MANIPULATOR UTERINE 4.5 ZUMI (MISCELLANEOUS) ×4 IMPLANT
NEEDLE INSUFFLATION 120MM (ENDOMECHANICALS) ×4 IMPLANT
OCCLUDER COLPOPNEUMO (BALLOONS) ×8 IMPLANT
PACK LAVH (CUSTOM PROCEDURE TRAY) ×4 IMPLANT
PAD PREP 24X48 CUFFED NSTRL (MISCELLANEOUS) ×8 IMPLANT
PLUG CATH AND CAP STER (CATHETERS) ×4 IMPLANT
PROTECTOR NERVE ULNAR (MISCELLANEOUS) ×8 IMPLANT
SET CYSTO W/LG BORE CLAMP LF (SET/KITS/TRAYS/PACK) ×8 IMPLANT
SET IRRIG TUBING LAPAROSCOPIC (IRRIGATION / IRRIGATOR) ×4 IMPLANT
SOLUTION ELECTROLUBE (MISCELLANEOUS) ×8 IMPLANT
STRIP CLOSURE SKIN 1/2X4 (GAUZE/BANDAGES/DRESSINGS) ×4 IMPLANT
SUT VIC AB 0 CT1 27 (SUTURE) ×5
SUT VIC AB 0 CT1 27XBRD ANBCTR (SUTURE) ×15 IMPLANT
SUT VIC AB 2-0 CT2 27 (SUTURE) ×8 IMPLANT
SUT VICRYL 0 UR6 27IN ABS (SUTURE) ×8 IMPLANT
SUT VICRYL RAPIDE 3 0 (SUTURE) ×8 IMPLANT
SYR 50ML LL SCALE MARK (SYRINGE) ×4 IMPLANT
SYSTEM CONVERTIBLE TROCAR (TROCAR) IMPLANT
TIP RUMI ORANGE 6.7MMX12CM (TIP) IMPLANT
TIP UTERINE 5.1X6CM LAV DISP (MISCELLANEOUS) IMPLANT
TIP UTERINE 6.7X10CM GRN DISP (MISCELLANEOUS) IMPLANT
TIP UTERINE 6.7X6CM WHT DISP (MISCELLANEOUS) IMPLANT
TIP UTERINE 6.7X8CM BLUE DISP (MISCELLANEOUS) IMPLANT
TOWEL OR 17X24 6PK STRL BLUE (TOWEL DISPOSABLE) ×8 IMPLANT
TROCAR DILATING TIP 12MM 150MM (ENDOMECHANICALS) IMPLANT
TROCAR DISP BLADELESS 8 DVNC (TROCAR) ×3 IMPLANT
TROCAR DISP BLADELESS 8MM (TROCAR) ×1
TROCAR XCEL 12X100 BLDLESS (ENDOMECHANICALS) ×4 IMPLANT
TUBING FILTER THERMOFLATOR (ELECTROSURGICAL) ×4 IMPLANT
WATER STERILE IRR 1000ML POUR (IV SOLUTION) ×12 IMPLANT

## 2013-01-17 NOTE — Transfer of Care (Signed)
Immediate Anesthesia Transfer of Care Note  Patient: Lauren Blanchard  Procedure(s) Performed: Procedure(s): ROBOTIC ASSISTED TOTAL HYSTERECTOMY (N/A) BILATERAL SALPINGECTOMY (Bilateral) CYSTOSCOPY  Patient Location: PACU  Anesthesia Type:General  Level of Consciousness: awake  Airway & Oxygen Therapy: Patient Spontanous Breathing  Post-op Assessment: Report given to PACU RN  Post vital signs: stable  Filed Vitals:   01/17/13 1148  BP: 126/86  Pulse: 94  Temp: 36.8 C  Resp: 16    Complications: No apparent anesthesia complications

## 2013-01-17 NOTE — H&P (Signed)
42 y.o. yo complains of menorrhagia, first presenting with complaints 04/2012.  PCP found her to be anemic and she did not like Integra.  TSH and FT4 were wnl.  We had discussed Novasure and pt decided to do expectant mgmt.  She presented again 10/2012 with continued menorrhagia requesting definitive mgmt with robotic TLH and bilateral salpinectiomy.  Past Medical History  Diagnosis Date  . Seasonal allergies   . IBS (irritable bowel syndrome)     constipation predominant  . PONV (postoperative nausea and vomiting)     has had PONV with most of her past surgeries  . PMDD (premenstrual dysphoric disorder)     takes Zoloft for this  . Iron deficiency anemia 2014    takes Integra supplement  . Epigastric pain March,2014    Takes Prevacid   Past Surgical History  Procedure Laterality Date  . Cesarean section      x2: 2001 and 2004  . Nasal sinus surgery      x3  . Mandible surgery  2007  . Tubal ligation    . Wisdom tooth extraction    . Leep  1997  . Tubal ligation  2004    History   Social History  . Marital Status: Married    Spouse Name: N/A    Number of Children: 3  . Years of Education: N/A   Occupational History  . office assistant    Social History Main Topics  . Smoking status: Never Smoker   . Smokeless tobacco: Never Used  . Alcohol Use: No  . Drug Use: No  . Sexually Active: Yes -- Female partner(s)    Birth Control/ Protection: Surgical   Other Topics Concern  . Not on file   Social History Narrative   Divorced and re-married.  Lives with 3 children, husband.  No pets.  No tobacco exposure    No current facility-administered medications on file prior to encounter.   Current Outpatient Prescriptions on File Prior to Encounter  Medication Sig Dispense Refill  . FeFum-FePoly-FA-B Cmp-C-Biot (INTEGRA PLUS) CAPS Take 1 capsule by mouth daily.  30 capsule  3  . fexofenadine-pseudoephedrine (ALLEGRA-D 24) 180-240 MG per 24 hr tablet Take 1 tablet by mouth  daily.      . fluticasone (FLONASE) 50 MCG/ACT nasal spray Place 2 sprays into the nose daily.      Marland Kitchen ibuprofen (ADVIL,MOTRIN) 200 MG tablet Take 400-600 mg by mouth every 6 (six) hours as needed for pain.       . Lactobacillus Rhamnosus, GG, (CULTURELLE PO) Take 1 capsule by mouth daily.      . lansoprazole (PREVACID) 15 MG capsule Take 15 mg by mouth daily.      . sertraline (ZOLOFT) 100 MG tablet Take 100 mg by mouth daily.      . valACYclovir (VALTREX) 1000 MG tablet Take 2 tablets at onset of cold sore.  Repeat once in 12 hours (4 tablets per sore)  20 tablet  1    Allergies  Allergen Reactions  . Sulfa Antibiotics Rash    @VITALS2 @  Lungs: clear to ascultation Cor:  RRR Abdomen:  soft, nontender, nondistended. Ex:  no cords, erythema [place]: approx 8x4x4cm uterus, R ovary with small simple cyst, LO not seen Pre-op Hb 10.5  A: Menorrhagia: options reviewed, risk/benefits/expectations, consent obtained.   P:  RATLH-BS.   All risks, benefits and alternatives d/w patient and she desires to proceed.  Patient has undergone a modified bowel prep and will receive  preop antibiotics and SCDs during the operation.     Philip Aspen

## 2013-01-17 NOTE — Anesthesia Postprocedure Evaluation (Signed)
  Anesthesia Post Note  Patient: Lauren Blanchard  Procedure(s) Performed: Procedure(s) (LRB): ROBOTIC ASSISTED TOTAL HYSTERECTOMY (N/A) BILATERAL SALPINGECTOMY (Bilateral) CYSTOSCOPY  Anesthesia type: GA  Patient location: PACU  Post pain: Pain level controlled  Post assessment: Post-op Vital signs reviewed  Last Vitals:  Filed Vitals:   01/17/13 1835  BP: 108/61  Pulse: 89  Temp: 36.7 C  Resp: 16    Post vital signs: Reviewed  Level of consciousness: sedated  Complications: No apparent anesthesia complications

## 2013-01-17 NOTE — Anesthesia Preprocedure Evaluation (Addendum)
Anesthesia Evaluation  Patient identified by MRN, date of birth, ID band Patient awake    Reviewed: Allergy & Precautions, H&P , NPO status , Patient's Chart, lab work & pertinent test results, reviewed documented beta blocker date and time   History of Anesthesia Complications (+) PONV  Airway Mallampati: II TM Distance: >3 FB Neck ROM: full    Dental  (+) Teeth Intact   Pulmonary neg pulmonary ROS,  breath sounds clear to auscultation  Pulmonary exam normal       Cardiovascular negative cardio ROS  Rhythm:regular Rate:Normal     Neuro/Psych PSYCHIATRIC DISORDERS (PMDD - on zoloft) negative neurological ROS     GI/Hepatic Neg liver ROS, GERD-  Medicated,  Endo/Other  negative endocrine ROS  Renal/GU negative Renal ROS  Female GU complaint     Musculoskeletal   Abdominal   Peds  Hematology  (+) anemia ,   Anesthesia Other Findings   Reproductive/Obstetrics negative OB ROS                          Anesthesia Physical Anesthesia Plan  ASA: II  Anesthesia Plan: General ETT   Post-op Pain Management:    Induction:   Airway Management Planned:   Additional Equipment:   Intra-op Plan:   Post-operative Plan:   Informed Consent: I have reviewed the patients History and Physical, chart, labs and discussed the procedure including the risks, benefits and alternatives for the proposed anesthesia with the patient or authorized representative who has indicated his/her understanding and acceptance.   Dental Advisory Given  Plan Discussed with: CRNA and Surgeon  Anesthesia Plan Comments:         Anesthesia Quick Evaluation

## 2013-01-17 NOTE — Anesthesia Procedure Notes (Signed)
Procedure Name: Intubation Date/Time: 01/17/2013 1:13 PM Performed by: Shanon Payor Pre-anesthesia Checklist: Suction available, Emergency Drugs available, Timeout performed, Patient identified and Patient being monitored Patient Re-evaluated:Patient Re-evaluated prior to inductionOxygen Delivery Method: Circle system utilized Preoxygenation: Pre-oxygenation with 100% oxygen Intubation Type: IV induction Ventilation: Mask ventilation without difficulty Laryngoscope Size: Mac and 3 Grade View: Grade I Tube type: Oral Tube size: 7.0 mm Number of attempts: 1 Airway Equipment and Method: Stylet Placement Confirmation: ETT inserted through vocal cords under direct vision,  positive ETCO2 and breath sounds checked- equal and bilateral Secured at: 22 cm Tube secured with: Tape Dental Injury: Teeth and Oropharynx as per pre-operative assessment

## 2013-01-17 NOTE — Brief Op Note (Signed)
01/17/2013  5:06 PM  PATIENT:  Lauren Blanchard  42 y.o. female  PRE-OPERATIVE DIAGNOSIS:  MENORRHAGIA / ANEMIA 16109 / [id number]  POST-OPERATIVE DIAGNOSIS:  MENORRHAGIA / ANEMIA 60454 / [id number]  PROCEDURE:  Procedure(s): ROBOTIC ASSISTED TOTAL HYSTERECTOMY (N/A) BILATERAL SALPINGECTOMY (Bilateral) CYSTOSCOPY  SURGEON:  Surgeon(s) and Role:    * Philip Aspen, DO - Primary    * W Lodema Hong, MD - Assisting  ANESTHESIA:   local and general  EBL:  Total I/O In: 2000 [I.V.:2000] Out: 250 [Urine:150; Blood:100]  BLOOD ADMINISTERED:none   LOCAL MEDICATIONS USED:  MARCAINE     SPECIMEN:  Source of Specimen:  uterus, cervix, bilateral tubes  DISPOSITION OF SPECIMEN:  PATHOLOGY  COUNTS:  YES  PLAN OF CARE: Admit for overnight observation  PATIENT DISPOSITION:  PACU - hemodynamically stable.

## 2013-01-18 ENCOUNTER — Encounter (HOSPITAL_COMMUNITY): Payer: Self-pay | Admitting: Obstetrics and Gynecology

## 2013-01-18 LAB — CBC
HCT: 29.2 % — ABNORMAL LOW (ref 36.0–46.0)
Hemoglobin: 9.5 g/dL — ABNORMAL LOW (ref 12.0–15.0)
MCH: 26.1 pg (ref 26.0–34.0)
MCHC: 32.5 g/dL (ref 30.0–36.0)
RDW: 17.9 % — ABNORMAL HIGH (ref 11.5–15.5)

## 2013-01-18 MED ORDER — ONDANSETRON HCL 4 MG PO TABS: 4.0000 mg | ORAL_TABLET | Freq: Four times a day (QID) | ORAL | Status: DC | PRN

## 2013-01-18 MED ORDER — OXYCODONE-ACETAMINOPHEN 5-325 MG PO TABS: 1.0000 | ORAL_TABLET | ORAL | Status: DC | PRN

## 2013-01-18 MED ORDER — ONDANSETRON HCL 4 MG PO TABS: 4.0000 mg | ORAL_TABLET | Freq: Three times a day (TID) | ORAL | Status: DC | PRN

## 2013-01-18 NOTE — Discharge Summary (Signed)
  Patient was admitted for overnight observation after undergoing a RATLH -BS for menorrhagia.  Please see op note for details.  Postoperatively patient did well.  Foley was removed, she was tolerating po intake and pain was controlled.  She is being discharged  With Rx for Percocet and motrin with instructions to f/u in 2 weeks with me.  All post-op instructions were reviewed with her.

## 2013-01-18 NOTE — Progress Notes (Signed)
Pt has had very flat affect all shift, appears depressed. Pt does not want to ambulate and does not tolerate it well. Ambulation has been performed twice in the room but pt has complaints of nausea and lightheadedness, constantly wants to get back in bed.

## 2013-01-18 NOTE — Anesthesia Postprocedure Evaluation (Signed)
Anesthesia Post Note  Patient: Lauren Blanchard  Procedure(s) Performed: Procedure(s) (LRB): ROBOTIC ASSISTED TOTAL HYSTERECTOMY (N/A) BILATERAL SALPINGECTOMY (Bilateral) CYSTOSCOPY  Anesthesia type: General  Patient location: Women[facility] Unit  Post pain: Pain level controlled  Post assessment: Post-op Vital signs reviewed  Last Vitals:  Filed Vitals:   01/18/13 0610  BP: 106/55  Pulse: 77  Temp: 36.7 C  Resp: 16    Post vital signs: Reviewed  Level of consciousness: sedated  Complications: No apparent anesthesia complications

## 2013-01-23 NOTE — Op Note (Signed)
NAMEKHADY, VANDENBERG NO.:  [id number]  MEDICAL RECORD NO.:  [id number]  LOCATION:  9318                          FACILITY:  [facility]  PHYSICIAN:  Philip Aspen, DO    DATE OF BIRTH:  1970/09/29  DATE OF PROCEDURE: DATE OF DISCHARGE:  01/18/2013                              OPERATIVE REPORT   PREOPERATIVE DIAGNOSIS:  Menorrhagia, anemia.  POSTOPERATIVE DIAGNOSIS:  Menorrhagia, anemia.  PROCEDURE:  Robotic-assisted total laparoscopic hysterectomy and bilateral salpingectomy with cystoscopy.  SURGEON:  Philip Aspen, DO.  ASSISTANT:  Kathee Polite, MD  ANESTHESIA:  General and local.  ESTIMATED BLOOD LOSS:  100 mL.  URINE OUTPUT:  150 mL.  FLUIDS:  2000 mL.  LOCAL MEDICATION USED AT SKIN:  Marcaine.  SPECIMENS:  Uterus, cervix, and bilateral tubes.  FINDINGS:  Normal-appearing ovaries bilaterally.  Bilateral tubes consistent with prior bilateral tubal ligation.  Dense adhesions from the anterior left-sided uterus to anterior left abdominal wall, thinner filmy adhesions from the right colon and right small bowel to the right anterior abdominal wall.  COMPLICATIONS:  None.  CONDITION:  Stable to PACU.  PROCEDURE IN DETAIL:  The patient was taken to the operating room, where general anesthesia was administered and found to be adequate.  She was prepped and draped in the normal sterile fashion in dorsal lithotomy position with both arms tucked on the side.  Exam under anesthesia revealed findings above.  Foley catheter was inserted.  Followed by HUMI uterine manipulator with a KOH colpotomy ring placed securely.  Suture placed at the anterior and posterior lip aided in securing the specimen within KOH ring.  Attention was turned to the abdomen where Allis clamps were placed at the lateral umbilical fold superior to the umbilicus.  A horizontal, approximately 12 mm skin incision was made with a scalpel, and a Veress needle was entered into the  peritoneum confirmed by saline drop test, followed by low-pressure while on low-flow.  Gas was then changed to high flow, and the abdomen was insufflated.  The Veress needle was removed, and Optiview was used to enter the abdomen under direct visualization.  On survey of the abdomen and pelvis, findings are as above.  Marcaine was placed at 3 additional skin incision sites, 2 which were bilateral and slightly inferior to the umbilical incision by 10 cm and additional 5 mm port was placed in the lower left quadrant. All trocars were placed under direct visualization.  The patient was placed in Trendelenburg, and robotic arms were attached to the port sites.  Bipolar cautery was initially used to grasp, coagulate, and transect the thick adhesions from the anterior uterus to the anterior abdominal wall.  These dense adhesions were carried carefully down to the lower uterine segment.  Attention was turned to the right index where the right tube was grasped and the bipolar cautery was used to dissect the tube from the mesosalpinx across the mesosalpinx.  Attention was turned to the left side where the same procedure was performed. Both ureters were seen across the external iliac artery in pelvic brim with peristalsis.  The uterus and adnexa were retracted to the left side and the round and broad ligaments  were sequentially clamped and cut down to the vascular uterine peritoneum, which was opened with monopolar scissors and the bladder dissected off the lower uterine segment and upper vagina along the right side.  The uterine vessels were identified on the uterus, and bipolar cautery was used to coagulate and cut the uterine vessels with the uterus retracted to the patient's left. Careful dissection continued down to the lower uterine segment and the bladder was retrograde filled for better visualization.  The uterine vessels were identified again, and the bipolar cautery was used to coagulate  and cut the uterine vessels.  With ZUMI uterine manipulator pushing the uterus to the anterior abdominal wall, the cervicovaginal junction was delineated by the colpotomy ring, which was adherent to the tissue.  The vagina was entered with monopolar scissors and incised circumferentially along the cervicovaginal junction.  The uterosacral and cardinal ligament complexes and vagina were completely detached from the cervix, and the uterus and bilateral tubes were delivered through the vaginal canal.  Bleeding spots were coagulated, and the pneumo- occluder balloon was placed in the vagina.  The vaginal cuff was closed laparoscopically with Vicryl suture and 5 interrupted figure-of-eight. Excellent hemostasis was noted.  The robot was removed from the operative site, and the patient was returned out of Trendelenburg, cystoscopy was performed and indigo carmine tinted urine was noted briskly at bilateral ureteral orifices, the bladder was surveyed and completely intact. The abdomen was desufflated followed by removal of the ports.  The umbilical fascia was closed with Vicryl in a figure-of-eight, and all skin incisions were closed with Monocryl subcuticularly.  The patient tolerated the procedure well.  Sponge, lap, and needle counts were correct x2.  The patient was taken to recovery in stable condition.          ______________________________ Philip Aspen, DO     Larimer/MEDQ  D:  01/22/2013  T:  01/23/2013  Job:  [id number]

## 2013-01-23 NOTE — Op Note (Signed)
See op note

## 2013-03-25 ENCOUNTER — Other Ambulatory Visit: Payer: Self-pay | Admitting: Family Medicine

## 2013-03-26 NOTE — Telephone Encounter (Signed)
Looks like we increased to the 24 mcg dose in June.  Please call patient and see which medication dose worked best for her--is this an error that they are requesting refill of , or does she prefer to go back to the lower dose.  Looks like she has appt scheduled for October, so just refill x 1 month

## 2013-03-26 NOTE — Telephone Encounter (Signed)
Patient states that the 24 mcg caused her to vomit so she likes the 8 mcg 2 times dailey better. I sent in a # 60 with 0 refills. CLS

## 2013-03-26 NOTE — Telephone Encounter (Signed)
Is this okay to refill? 

## 2013-04-24 ENCOUNTER — Ambulatory Visit: Admitting: Family Medicine

## 2013-05-01 ENCOUNTER — Encounter: Payer: Self-pay | Admitting: Family Medicine

## 2013-05-01 ENCOUNTER — Ambulatory Visit (INDEPENDENT_AMBULATORY_CARE_PROVIDER_SITE_OTHER): Admitting: Family Medicine

## 2013-05-01 VITALS — BP 120/78 | HR 80 | Temp 98.1°F | Ht 65.0 in | Wt 149.0 lb

## 2013-05-01 DIAGNOSIS — Z23 Encounter for immunization: Secondary | ICD-10-CM

## 2013-05-01 DIAGNOSIS — K589 Irritable bowel syndrome without diarrhea: Secondary | ICD-10-CM

## 2013-05-01 DIAGNOSIS — F3281 Premenstrual dysphoric disorder: Secondary | ICD-10-CM

## 2013-05-01 DIAGNOSIS — N920 Excessive and frequent menstruation with regular cycle: Secondary | ICD-10-CM

## 2013-05-01 DIAGNOSIS — E559 Vitamin D deficiency, unspecified: Secondary | ICD-10-CM

## 2013-05-01 DIAGNOSIS — G4700 Insomnia, unspecified: Secondary | ICD-10-CM

## 2013-05-01 DIAGNOSIS — J309 Allergic rhinitis, unspecified: Secondary | ICD-10-CM

## 2013-05-01 DIAGNOSIS — N943 Premenstrual tension syndrome: Secondary | ICD-10-CM

## 2013-05-01 DIAGNOSIS — D509 Iron deficiency anemia, unspecified: Secondary | ICD-10-CM

## 2013-05-01 LAB — CBC WITH DIFFERENTIAL/PLATELET
Basophils Absolute: 0 10*3/uL (ref 0.0–0.1)
Basophils Relative: 1 % (ref 0–1)
Eosinophils Relative: 2 % (ref 0–5)
HCT: 33.8 % — ABNORMAL LOW (ref 36.0–46.0)
Lymphocytes Relative: 38 % (ref 12–46)
Lymphs Abs: 1.7 10*3/uL (ref 0.7–4.0)
MCH: 27.2 pg (ref 26.0–34.0)
MCHC: 33.1 g/dL (ref 30.0–36.0)
MCV: 82 fL (ref 78.0–100.0)
Monocytes Absolute: 0.4 10*3/uL (ref 0.1–1.0)
Neutro Abs: 2.2 10*3/uL (ref 1.7–7.7)
Platelets: 315 10*3/uL (ref 150–400)
RBC: 4.12 MIL/uL (ref 3.87–5.11)
RDW: 15.9 % — ABNORMAL HIGH (ref 11.5–15.5)
WBC: 4.4 10*3/uL (ref 4.0–10.5)

## 2013-05-01 MED ORDER — SERTRALINE HCL 100 MG PO TABS: 150.0000 mg | ORAL_TABLET | Freq: Every day | ORAL | Status: DC

## 2013-05-01 MED ORDER — LUBIPROSTONE 8 MCG PO CAPS: ORAL_CAPSULE | ORAL | Status: DC

## 2013-05-01 NOTE — Progress Notes (Signed)
Chief Complaint  Patient presents with  . Anemia    follow up on anemia, patient had hysterectomy in July and is no longer having heavy periods.    Patient presents for f/u anemia and IBS with constipation.  With respect to her anemia, it was felt to be due to menorrhagia.  She underwent a robotic-assisted hysterectomy, and has no further vaginal bleeding.  Her energy had been much improved, until 2 weeks ago when her allergies kicked in. She has pressure near her right eye, some congestion.  Mucus is white.  Having PND, some coughing spells.  She uses a mupirocin/gentamycin sinus wash prn from her ENT.  IBS:  Taking Amitiza .  We tried increasing to 24 mcg dose, but it caused nausea/vomiting.  She took it just for 2 nights, vomiting an hour after taking it. She gets mild nausea from even the 8 mcg dose, so instead of taking it twice daily, she takes 2 at bedtime (doesn't notice the nausea while sleeping). She takes this along with 3 stool softeners, and constipation is somewhat controlled with this regimen.  She thinks there is a hormonal component, getting constipated around her cycles (which now is hard to track due to lack of menses s/p hyst).  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  She is very busy at work, feels overloaded at times, and wonders if that contributes.  Notices this since her divorce.  She will take 5 benadryl at bedtime prn, sometimes can get by with 3-4 tablets.  She uses benadryl to sleep 3-4 nights/week.  She has struggled with her sleep more since her divorce.  She notices being very irritable and easily bothered around her cycles.  She will feel down, and sometimes cries easily. She has been on zoloft for PMDD, but thinking it isn't working very well.  She previously recalls trying prozac, cymbalta, possibly pristiq (?).  Past Medical History   Diagnosis Date  . Seasonal allergies   . IBS (irritable bowel syndrome)     constipation predominant  . PONV (postoperative nausea and vomiting)     has had PONV with most of her past surgeries  . PMDD (premenstrual dysphoric disorder)     takes Zoloft for this  . Iron deficiency anemia 2014    takes Integra supplement  . Epigastric pain March,2014    Takes Prevacid   Past Surgical History  Procedure Laterality Date  . Cesarean section      x2: 2001 and 2004  . Nasal sinus surgery      x3  . Mandible surgery  2007  . Tubal ligation    . Wisdom tooth extraction    . Leep  1997  . Tubal ligation  2004  . Robotic assisted total hysterectomy N/A 01/17/2013    Procedure: ROBOTIC ASSISTED TOTAL HYSTERECTOMY;  Surgeon: Philip Aspen, DO;  Location: [facility];  Service: Gynecology;  Laterality: N/A;  . Bilateral salpingectomy Bilateral 01/17/2013    Procedure: BILATERAL SALPINGECTOMY;  Surgeon: Philip Aspen, DO;  Location: [facility];  Service: Gynecology;  Laterality: Bilateral;  . Cystoscopy  01/17/2013    Procedure: CYSTOSCOPY;  Surgeon: Philip Aspen, DO;  Location: [facility];  Service: Gynecology;;   History   Social History  . Marital Status: Married    Spouse Name: N/A    Number of Children: 3  . Years of Education: N/A  Occupational History  . office assistant    Social History Main Topics  . Smoking status: Never Smoker   . Smokeless tobacco: Never Used  . Alcohol Use: No  . Drug Use: No  . Sexual Activity: Yes    Partners: Male    Birth Control/ Protection: Surgical   Other Topics Concern  . Not on file   Social History Narrative   Divorced and re-married.  Lives with 3 children, husband.  No pets.  No tobacco exposure   Current outpatient prescriptions:AMITIZA 8 MCG capsule, TAKE ONE CAPSULE BY MOUTH TWICE DAILY WITH A MEAL, Disp: 60 capsule, Rfl: 0;  Biotin (BIOTIN ULTRA STRENGTH) 10 MG CAPS, Take 1 tablet by mouth daily., Disp: , Rfl: ;  cholecalciferol  (VITAMIN D) 1000 UNITS tablet, Take 1,000 Units by mouth daily., Disp: , Rfl: ;  fexofenadine-pseudoephedrine (ALLEGRA-D 24) 180-240 MG per 24 hr tablet, Take 1 tablet by mouth daily., Disp: , Rfl:  fluticasone (FLONASE) 50 MCG/ACT nasal spray, Place 2 sprays into the nose daily., Disp: , Rfl: ;  ibuprofen (ADVIL,MOTRIN) 200 MG tablet, Take 400-600 mg by mouth every 6 (six) hours as needed for pain. , Disp: , Rfl: ;  Lactobacillus Rhamnosus, GG, (CULTURELLE PO), Take 1 capsule by mouth daily., Disp: , Rfl: ;  lansoprazole (PREVACID) 15 MG capsule, Take 15 mg by mouth daily., Disp: , Rfl:  naphazoline-pheniramine (NAPHCON-A) 0.025-0.3 % ophthalmic solution, Place 1 drop into both eyes daily., Disp: , Rfl: ;  sertraline (ZOLOFT) 100 MG tablet, Take 100 mg by mouth daily., Disp: , Rfl: ;  vitamin E (VITAMIN E) 1000 UNIT capsule, Take 1,000 Units by mouth daily., Disp: , Rfl: ;  valACYclovir (VALTREX) 1000 MG tablet, Take 2 tablets at onset of cold sore.  Repeat once in 12 hours (4 tablets per sore), Disp: 20 tablet, Rfl: 1  Allergies  Allergen Reactions  . Sulfa Antibiotics Rash   ROS: denies fevers, chills, nausea, vomiting, bowel changes, bleeding, bruising, rashes, cough, shortness of breath or other concerns, except as mentioned in HPI.  PHYSICAL EXAM: BP 120/78  Pulse 80  Temp(Src) 98.1 F (36.7 C) (Oral)  Ht 5\' 5"  (1.651 m)  Wt 149 lb (67.586 kg)  BMI 24.79 kg/m2  LMP 01/10/2013 Well developed, pleasant female in no distress HEENT:  PERRL, EOMI, conjunctiva clear.  OP clear. Nasal mucosa only mildly edematous, with areas of recent bleeding. No purulence.  Sinuses nontender Neck: no lymphadenopathy, thyromegaly or mass Heart: regular rate and rhythm without murmur Lungs: clear bilaterally Abdomen: soft, nontender, no mass.  Normal bowel sounds Extremities: no edema Skin: no rash Psych: normal mood, affect, hygiene, grooming, speech and eye contact  ASSESSMENT/PLAN:  Anemia, iron  deficiency - Plan: CBC with Differential, Ferritin  Menorrhagia - Plan: CBC with Differential, Ferritin  Unspecified vitamin D deficiency - Plan: Vitamin D 25 hydroxy  Insomnia  Allergic rhinitis, cause unspecified  PMDD (premenstrual dysphoric disorder) - Plan: sertraline (ZOLOFT) 100 MG tablet  IBS (irritable bowel syndrome) - Plan: lubiprostone (AMITIZA) 8 MCG capsule  Need for influenza vaccination - Plan: Flu Vaccine QUAD 36+ mos PF IM (Fluarix)   PMDD--recommend increasing sertraline to 150mg  at bedtime.  Consider stress reduction techniques, counseling Insomnia--reviewed some sleep hygiene measures.  Encouraged daily exercise.  Do not exceed 100mg  of benadryl, and would start at lower dose since we are increasing the zoloft.  24 hr decongestant in the Allegra-D might also contribute IBS--overall stable on current dose of 16 mcg at bedtime  Anemia--due for recheck  F/u as scheduled for CPE in Feb

## 2013-05-01 NOTE — Patient Instructions (Signed)
Increase the sertraline to 1.5 tablets at bedtime. It is possible that the decongestant in the 24 hr Allegra D is interfering with sleep some.  Try changing to 12 hour Allegra D in the morning, and using the benadryl (up to 4 tablets maximum) at bedtime, vs getting a separate Allegra and a separate sudafed to take just during the day.  Continue your other medications the same.

## 2013-05-02 LAB — VITAMIN D 25 HYDROXY (VIT D DEFICIENCY, FRACTURES): Vit D, 25-Hydroxy: 45 ng/mL (ref 30–89)

## 2013-05-11 LAB — HM MAMMOGRAPHY: HM Mammogram: NORMAL

## 2013-05-11 LAB — HM PAP SMEAR: HM Pap smear: NORMAL

## 2013-08-15 ENCOUNTER — Other Ambulatory Visit: Payer: Self-pay | Admitting: *Deleted

## 2013-08-15 ENCOUNTER — Telehealth: Payer: Self-pay | Admitting: Family Medicine

## 2013-08-15 DIAGNOSIS — F3281 Premenstrual dysphoric disorder: Secondary | ICD-10-CM

## 2013-08-15 DIAGNOSIS — K589 Irritable bowel syndrome without diarrhea: Secondary | ICD-10-CM

## 2013-08-15 MED ORDER — SERTRALINE HCL 100 MG PO TABS: 150.0000 mg | ORAL_TABLET | Freq: Every day | ORAL | Status: DC

## 2013-08-15 MED ORDER — LUBIPROSTONE 8 MCG PO CAPS: ORAL_CAPSULE | ORAL | Status: DC

## 2013-08-15 NOTE — Telephone Encounter (Signed)
Done

## 2013-08-16 NOTE — Telephone Encounter (Signed)
Done

## 2013-08-29 ENCOUNTER — Encounter: Payer: Self-pay | Admitting: Family Medicine

## 2013-08-29 ENCOUNTER — Ambulatory Visit (INDEPENDENT_AMBULATORY_CARE_PROVIDER_SITE_OTHER): Admitting: Family Medicine

## 2013-08-29 ENCOUNTER — Encounter: Admitting: Family Medicine

## 2013-08-29 VITALS — BP 108/76 | HR 84 | Ht 65.0 in | Wt 149.0 lb

## 2013-08-29 DIAGNOSIS — K5900 Constipation, unspecified: Secondary | ICD-10-CM

## 2013-08-29 DIAGNOSIS — K589 Irritable bowel syndrome without diarrhea: Secondary | ICD-10-CM

## 2013-08-29 DIAGNOSIS — Z0000 Encounter for general adult medical examination without abnormal findings: Secondary | ICD-10-CM

## 2013-08-29 DIAGNOSIS — F3281 Premenstrual dysphoric disorder: Secondary | ICD-10-CM

## 2013-08-29 DIAGNOSIS — N943 Premenstrual tension syndrome: Secondary | ICD-10-CM

## 2013-08-29 DIAGNOSIS — D509 Iron deficiency anemia, unspecified: Secondary | ICD-10-CM

## 2013-08-29 LAB — POCT URINALYSIS DIPSTICK
BILIRUBIN UA: NEGATIVE
Blood, UA: NEGATIVE
Glucose, UA: NEGATIVE
KETONES UA: NEGATIVE
Leukocytes, UA: NEGATIVE
Nitrite, UA: NEGATIVE
Protein, UA: NEGATIVE
Spec Grav, UA: 1.015
Urobilinogen, UA: NEGATIVE
pH, UA: 5

## 2013-08-29 LAB — CBC WITH DIFFERENTIAL/PLATELET
BASOS ABS: 0.1 10*3/uL (ref 0.0–0.1)
Basophils Relative: 1 % (ref 0–1)
Eosinophils Absolute: 0.2 10*3/uL (ref 0.0–0.7)
Eosinophils Relative: 3 % (ref 0–5)
HEMATOCRIT: 37.3 % (ref 36.0–46.0)
Hemoglobin: 12.3 g/dL (ref 12.0–15.0)
LYMPHS PCT: 35 % (ref 12–46)
Lymphs Abs: 2.6 10*3/uL (ref 0.7–4.0)
MCH: 27.4 pg (ref 26.0–34.0)
MCHC: 33 g/dL (ref 30.0–36.0)
MCV: 83.1 fL (ref 78.0–100.0)
MONOS PCT: 9 % (ref 3–12)
Monocytes Absolute: 0.7 10*3/uL (ref 0.1–1.0)
NEUTROS ABS: 3.8 10*3/uL (ref 1.7–7.7)
Neutrophils Relative %: 52 % (ref 43–77)
Platelets: 403 10*3/uL — ABNORMAL HIGH (ref 150–400)
RBC: 4.49 MIL/uL (ref 3.87–5.11)
RDW: 16.2 % — ABNORMAL HIGH (ref 11.5–15.5)
WBC: 7.4 10*3/uL (ref 4.0–10.5)

## 2013-08-29 LAB — FERRITIN: Ferritin: 1 ng/mL — ABNORMAL LOW (ref 10–291)

## 2013-08-29 LAB — GLUCOSE, RANDOM: Glucose, Bld: 84 mg/dL (ref 70–99)

## 2013-08-29 LAB — TSH: TSH: 1.381 u[IU]/mL (ref 0.350–4.500)

## 2013-08-29 MED ORDER — SERTRALINE HCL 100 MG PO TABS: 150.0000 mg | ORAL_TABLET | Freq: Every day | ORAL | Status: DC

## 2013-08-29 MED ORDER — LUBIPROSTONE 8 MCG PO CAPS: ORAL_CAPSULE | ORAL | Status: DC

## 2013-08-29 NOTE — Progress Notes (Signed)
Chief Complaint  Patient presents with  . Annual Exam    fasting annual exam, does not need pap today just had one with Jake Shark this past Nov. No concerns today.    Lauren Blanchard is a 43 y.o. female who presents for a complete physical.  She has the following concerns:  IBS with constipation--taking 16 mcg of amitiza at bedtime, along with pericolace.  Her bowels are very well controlled on this current regimen.  She had been having stools daily, until recently being on Levaquin (which always worsens constipation).  No BM in 2 days.  Symptoms (constipation) are always a little worse cyclically (along with breast tenderness).  Recent sinusitis--completed a week of levaquin and symptoms are improved.  Still has runny nose, but clear. Denies sinus pain, fever, sore throat. Slight cough.  GERD/h/o epigastric pain:  She takes Prevacid (OTC) every day, and denies any recurrent epigastric pain, reflux, or heartburn.    PMDD--Dose of zoloft was increased to 111m in October, and has been doing better.  Denies side effects.  She needs 90 day prescription for mail order. Since she quit her last job, which was very stressful, the symptoms are improved.  She still has increased irritability cyclically, but much less since not at that job.  Vit D deficiency:  She is compliant with taking daily OTC vitamin.  Last check was in October, and deficiency had been corrected (level was 433.  Immunization History  Administered Date(s) Administered  . Influenza Split 05/24/2012  . Influenza,inj,Quad PF,36+ Mos 05/01/2013  . MMR 06/07/2010, 07/08/2010  . Td 05/24/2010   Last Pap smear: 05/2013 with GYN Last mammogram: 05/2013 Last colonoscopy: 1994 (as part of eval for IBS)  Last DEXA: never  Dentist: every 6 months  Ophtho: never  Exercise: None  Lab Results  Component Value Date   CHOL 221* 08/28/2012   HDL 76 08/28/2012   LDLCALC 130* 08/28/2012   TRIG 74 08/28/2012   CHOLHDL 2.9 08/28/2012    Past Medical History  Diagnosis Date  . Seasonal allergies   . IBS (irritable bowel syndrome)     constipation predominant  . PONV (postoperative nausea and vomiting)     has had PONV with most of her past surgeries  . PMDD (premenstrual dysphoric disorder)     takes Zoloft for this  . Iron deficiency anemia 2014  . Epigastric pain March,2014    Takes Prevacid (pain resolved)  . Meniere disease 2001    chronic tinnitus; no vertigo since limiting sodium intake    Past Surgical History  Procedure Laterality Date  . Cesarean section      x2: 2001 and 2004  . Nasal sinus surgery      x3  . Mandible surgery  2007  . Wisdom tooth extraction    . Leep  1997  . Tubal ligation  2004  . Robotic assisted total hysterectomy N/A 01/17/2013    Procedure: ROBOTIC ASSISTED TOTAL HYSTERECTOMY;  Surgeon: SAllyn Kenner DO;  Location: [facility];  Service: Gynecology;  Laterality: N/A;  . Bilateral salpingectomy Bilateral 01/17/2013    Procedure: BILATERAL SALPINGECTOMY;  Surgeon: SAllyn Kenner DO;  [facility];  Service: Gynecology;  Laterality: Bilateral;  . Cystoscopy  01/17/2013    Procedure: CYSTOSCOPY;  Surgeon: SAllyn Kenner DO;  [facility];  Service: Gynecology;;    History   Social History  . Marital Status: Married    Spouse Name: N/A    Number of Children: 3  .  Years of Education: N/A   Occupational History  . homemaker    Social History Main Topics  . Smoking status: Never Smoker   . Smokeless tobacco: Never Used  . Alcohol Use: No  . Drug Use: No  . Sexual Activity: Yes    Partners: Male    Birth Control/ Protection: Surgical   Other Topics Concern  . Not on file   Social History Narrative   Divorced and re-married.  Lives with 3 children, husband.  Pet hedgehog.  No tobacco exposure    Family History  Problem Relation Age of Onset  . Diabetes Mother   . Hypertension Mother   . Irritable bowel syndrome Mother   . Alzheimer's disease Father  54  . Hypertension Maternal Grandmother   . Hyperlipidemia Maternal Grandmother   . Stroke Maternal Grandfather   . Diabetes Paternal Grandmother   . Alzheimer's disease Paternal Grandmother   . Heart disease Paternal Grandfather 86    died of MI  . Turner syndrome Daughter   . Cancer Neg Hx    Current Outpatient Prescriptions on File Prior to Visit  Medication Sig Dispense Refill  . Biotin (BIOTIN ULTRA STRENGTH) 10 MG CAPS Take 1 tablet by mouth daily.      . cholecalciferol (VITAMIN D) 1000 UNITS tablet Take 1,000 Units by mouth daily.      . fexofenadine-pseudoephedrine (ALLEGRA-D 24) 180-240 MG per 24 hr tablet Take 1 tablet by mouth daily.      . fluticasone (FLONASE) 50 MCG/ACT nasal spray Place 2 sprays into the nose daily.      Marland Kitchen ibuprofen (ADVIL,MOTRIN) 200 MG tablet Take 400-600 mg by mouth every 6 (six) hours as needed for pain.       . Lactobacillus Rhamnosus, GG, (CULTURELLE PO) Take 1 capsule by mouth daily.      . lansoprazole (PREVACID) 15 MG capsule Take 15 mg by mouth daily.      . naphazoline-pheniramine (NAPHCON-A) 0.025-0.3 % ophthalmic solution Place 1 drop into both eyes daily.      . valACYclovir (VALTREX) 1000 MG tablet Take 2 tablets at onset of cold sore.  Repeat once in 12 hours (4 tablets per sore)  20 tablet  1  . vitamin E (VITAMIN E) 1000 UNIT capsule Take 1,000 Units by mouth daily.       No current facility-administered medications on file prior to visit.     Allergies  Allergen Reactions  . Sulfa Antibiotics Rash   ROS: The patient denies anorexia, fever, headaches, vision changes (mild changes with small print), decreased hearing, ear pain, sore throat, breast concerns, chest pain, palpitations, dizziness, syncope, dyspnea on exertion, cough, swelling, vomiting, diarrhea, abdominal pain, melena, hematochezia, hematuria, dysuria, vaginal discharge, odor or itch, genital lesions, joint pains, numbness, tingling, weakness, tremor, suspicious skin  lesions, abnormal bleeding/bruising, or enlarged lymph nodes.  + constipation, chronic  Gained 4 pounds in the last year Occasional right sided neck pain, pulled a muscle last month Chronic tinnitus  PHYSICAL EXAM:  BP 108/76  Pulse 84  Ht 5' 5"  (1.651 m)  Wt 149 lb (67.586 kg)  BMI 24.79 kg/m2  LMP 01/10/2013  General Appearance:  Alert, cooperative, no distress, appears stated age   Head:  Normocephalic, without obvious abnormality, atraumatic   Eyes:  PERRL, conjunctiva/corneas clear, EOM's intact, fundi  benign   Ears:  Normal TM's and external ear canals   Nose:  Nares normal, mucosa normal, no drainage or sinus tenderness   Throat:  Lips, mucosa, and tongue normal; teeth and gums normal   Neck:  Supple, no lymphadenopathy; thyroid: no enlargement/tenderness/nodules; no carotid  bruit or JVD   Back:  Spine nontender, no curvature, ROM normal, no CVA tenderness   Lungs:  Clear to auscultation bilaterally without wheezes, rales or ronchi; respirations unlabored   Chest Wall:  No tenderness or deformity   Heart:  Regular rate and rhythm, S1 and S2 normal, no murmur, rub  or gallop   Breast Exam:  Deferred to GYN   Abdomen:  Soft, nondistended, normoactive bowel sounds,  no masses, no hepatosplenomegaly. Very mild epigastric tenderness   Genitalia:  Deferred to GYN      Extremities:  No clubbing, cyanosis or edema   Pulses:  2+ and symmetric all extremities   Skin:  Skin color, texture, turgor normal, no rashes or lesions   Lymph nodes:  Cervical, supraclavicular, and axillary nodes normal   Neurologic:  CNII-XII intact, normal strength, sensation and gait; reflexes 2+ and symmetric throughout          Psych: Normal mood, affect, hygiene and grooming.    ASSESSMENT/PLAN:  Routine general medical examination at a health care facility - Plan: Visual acuity screening, POCT Urinalysis Dipstick, Glucose, random, TSH, CBC with Differential  IBS (irritable bowel syndrome) -  controlled - Plan: lubiprostone (AMITIZA) 8 MCG capsule  PMDD (premenstrual dysphoric disorder) - controlled - Plan: sertraline (ZOLOFT) 100 MG tablet  Unspecified constipation - Plan: TSH  Anemia, iron deficiency - improved s/p hysterectomy; still had mild anemia and low iron stores on last check.  recheck today - Plan: CBC with Differential, Ferritin  Epigastric pain - GERD--improved with prevacid. reviewed risks of untreated reflux vs longterm PPI use. recommended she take MVI along with prevacid.  Consider trial of tapering down on Prevacid (to every other day, and eventually prn, if tolerated).  Okay to use daily long-term if needed.  Discussed monthly self breast exams and yearly mammograms after the age of 69; at least 30 minutes of aerobic activity at least 5 days/week; proper sunscreen use reviewed; healthy diet, including goals of calcium and vitamin D intake and alcohol recommendations (less than or equal to 1 drink/day) reviewed; regular seatbelt use; changing batteries in smoke detectors. Immunization recommendations discussed--Td was confirmed to be given in 2011, not Tdap.  Will give Tdap in 2016. Colonoscopy recommendations reviewed--age 6.    Glucose; CBC, ferritin, TSH

## 2013-08-29 NOTE — Patient Instructions (Signed)
  HEALTH MAINTENANCE RECOMMENDATIONS:  It is recommended that you get at least 30 minutes of aerobic exercise at least 5 days/week (for weight loss, you may need as much as 60-90 minutes). This can be any activity that gets your heart rate up. This can be divided in 10-15 minute intervals if needed, but try and build up your endurance at least once a week.  Weight bearing exercise is also recommended twice weekly.  Eat a healthy diet with lots of vegetables, fruits and fiber.  "Colorful" foods have a lot of vitamins (ie green vegetables, tomatoes, red peppers, etc).  Limit sweet tea, regular sodas and alcoholic beverages, all of which has a lot of calories and sugar.  Up to 1 alcoholic drink daily may be beneficial for women (unless trying to lose weight, watch sugars).  Drink a lot of water.  Calcium recommendations are 1200-1500 mg daily (1500 mg for postmenopausal women or women without ovaries), and vitamin D 1000 IU daily.  This should be obtained from diet and/or supplements (vitamins), and calcium should not be taken all at once, but in divided doses.  Monthly self breast exams and yearly mammograms for women over the age of 1 is recommended.  Sunscreen of at least SPF 30 should be used on all sun-exposed parts of the skin when outside between the hours of 10 am and 4 pm (not just when at beach or pool, but even with exercise, golf, tennis, and yard work!)  Use a sunscreen that says "broad spectrum" so it covers both UVA and UVB rays, and make sure to reapply every 1-2 hours.  Remember to change the batteries in your smoke detectors when changing your clock times in the spring and fall.  Use your seat belt every time you are in a car, and please drive safely and not be distracted with cell phones and texting while driving.  Consider trial of tapering down on Prevacid (to every other day, and eventually prn, if tolerated).  Okay to use daily long-term if needed.

## 2013-08-30 ENCOUNTER — Encounter: Payer: Self-pay | Admitting: Family Medicine

## 2014-05-12 ENCOUNTER — Encounter: Payer: Self-pay | Admitting: Family Medicine

## 2014-06-16 ENCOUNTER — Encounter: Payer: Self-pay | Admitting: Family Medicine

## 2014-06-16 ENCOUNTER — Ambulatory Visit (INDEPENDENT_AMBULATORY_CARE_PROVIDER_SITE_OTHER): Admitting: Family Medicine

## 2014-06-16 VITALS — BP 120/70 | HR 86 | Temp 100.1°F | Wt 143.0 lb

## 2014-06-16 DIAGNOSIS — R350 Frequency of micturition: Secondary | ICD-10-CM

## 2014-06-16 DIAGNOSIS — N390 Urinary tract infection, site not specified: Secondary | ICD-10-CM

## 2014-06-16 LAB — POCT URINALYSIS DIPSTICK
Bilirubin, UA: NEGATIVE
Blood, UA: NEGATIVE
Glucose, UA: NEGATIVE
Ketones, UA: NEGATIVE
LEUKOCYTES UA: NEGATIVE
NITRITE UA: NEGATIVE
PROTEIN UA: 0.15
Spec Grav, UA: >=1.03
Urobilinogen, UA: NEGATIVE
pH, UA: 6

## 2014-06-16 MED ORDER — CIPROFLOXACIN HCL 500 MG PO TABS: 500.0000 mg | ORAL_TABLET | Freq: Two times a day (BID) | ORAL | Status: DC

## 2014-06-16 NOTE — Progress Notes (Signed)
   Subjective:    Patient ID: Lauren Blanchard, female    DOB: 1971/06/15, 43 y.o.   MRN: [id number]  HPI She complains of a two-week history of urgency, frequency and dysuria but no fever, chills.   Review of Systems     Objective:   Physical Exam Alert and in no distress. Urine microscopic was negative.       Assessment & Plan:  Frequent urination - Plan: POCT Urinalysis Dipstick, ciprofloxacin (CIPRO) 500 MG tablet  Acute UTI - Plan: ciprofloxacin (CIPRO) 500 MG tablet  I will treat her for UTI in spite of negative dipstick and microscopic ;instructed to use Azo-Standard and call Thursday if still having symptoms.

## 2014-06-16 NOTE — Patient Instructions (Signed)
Drink plenty of fluids and use Azo-Standard

## 2014-09-03 ENCOUNTER — Encounter: Payer: Self-pay | Admitting: Family Medicine

## 2014-09-03 ENCOUNTER — Ambulatory Visit (INDEPENDENT_AMBULATORY_CARE_PROVIDER_SITE_OTHER): Admitting: Family Medicine

## 2014-09-03 VITALS — BP 130/84 | HR 76 | Ht 65.0 in | Wt 146.0 lb

## 2014-09-03 DIAGNOSIS — R1013 Epigastric pain: Secondary | ICD-10-CM

## 2014-09-03 DIAGNOSIS — F3281 Premenstrual dysphoric disorder: Secondary | ICD-10-CM

## 2014-09-03 DIAGNOSIS — E559 Vitamin D deficiency, unspecified: Secondary | ICD-10-CM

## 2014-09-03 DIAGNOSIS — Z23 Encounter for immunization: Secondary | ICD-10-CM

## 2014-09-03 DIAGNOSIS — R5383 Other fatigue: Secondary | ICD-10-CM

## 2014-09-03 DIAGNOSIS — Z0000 Encounter for general adult medical examination without abnormal findings: Secondary | ICD-10-CM

## 2014-09-03 DIAGNOSIS — K589 Irritable bowel syndrome without diarrhea: Secondary | ICD-10-CM

## 2014-09-03 DIAGNOSIS — J302 Other seasonal allergic rhinitis: Secondary | ICD-10-CM

## 2014-09-03 DIAGNOSIS — N943 Premenstrual tension syndrome: Secondary | ICD-10-CM

## 2014-09-03 LAB — CBC WITH DIFFERENTIAL/PLATELET
Basophils Absolute: 0.1 10*3/uL (ref 0.0–0.1)
Basophils Relative: 1 % (ref 0–1)
EOS PCT: 2 % (ref 0–5)
Eosinophils Absolute: 0.1 10*3/uL (ref 0.0–0.7)
HCT: 38.9 % (ref 36.0–46.0)
HEMOGLOBIN: 12.6 g/dL (ref 12.0–15.0)
LYMPHS ABS: 2.3 10*3/uL (ref 0.7–4.0)
Lymphocytes Relative: 36 % (ref 12–46)
MCH: 29.4 pg (ref 26.0–34.0)
MCHC: 32.4 g/dL (ref 30.0–36.0)
MCV: 90.9 fL (ref 78.0–100.0)
MONOS PCT: 10 % (ref 3–12)
MPV: 9.9 fL (ref 8.6–12.4)
Monocytes Absolute: 0.6 10*3/uL (ref 0.1–1.0)
Neutro Abs: 3.3 10*3/uL (ref 1.7–7.7)
Neutrophils Relative %: 51 % (ref 43–77)
PLATELETS: 334 10*3/uL (ref 150–400)
RBC: 4.28 MIL/uL (ref 3.87–5.11)
RDW: 14.3 % (ref 11.5–15.5)
WBC: 6.4 10*3/uL (ref 4.0–10.5)

## 2014-09-03 LAB — POCT URINALYSIS DIPSTICK
Bilirubin, UA: NEGATIVE
Blood, UA: NEGATIVE
Glucose, UA: NEGATIVE
Ketones, UA: NEGATIVE
Leukocytes, UA: NEGATIVE
NITRITE UA: NEGATIVE
Spec Grav, UA: 1.025
Urobilinogen, UA: NEGATIVE
pH, UA: 6

## 2014-09-03 LAB — LIPID PANEL
CHOL/HDL RATIO: 3.2 ratio
CHOLESTEROL: 234 mg/dL — AB (ref 0–200)
HDL: 73 mg/dL (ref >=46)
LDL CALC: 130 mg/dL — AB (ref 0–99)
Triglycerides: 156 mg/dL — ABNORMAL HIGH (ref <150)
VLDL: 31 mg/dL (ref 0–40)

## 2014-09-03 LAB — COMPREHENSIVE METABOLIC PANEL
ALT: 15 U/L (ref 0–35)
AST: 16 U/L (ref 0–37)
Albumin: 4.6 g/dL (ref 3.5–5.2)
Alkaline Phosphatase: 58 U/L (ref 39–117)
BILIRUBIN TOTAL: 0.3 mg/dL (ref 0.2–1.2)
BUN: 19 mg/dL (ref 6–23)
CALCIUM: 9.7 mg/dL (ref 8.4–10.5)
CHLORIDE: 100 meq/L (ref 96–112)
CO2: 26 meq/L (ref 19–32)
CREATININE: 0.69 mg/dL (ref 0.50–1.10)
Glucose, Bld: 81 mg/dL (ref 70–99)
Potassium: 4.8 mEq/L (ref 3.5–5.3)
Sodium: 136 mEq/L (ref 135–145)
Total Protein: 7.4 g/dL (ref 6.0–8.3)

## 2014-09-03 LAB — TSH: TSH: 0.816 u[IU]/mL (ref 0.350–4.500)

## 2014-09-03 MED ORDER — SERTRALINE HCL 100 MG PO TABS: 200.0000 mg | ORAL_TABLET | Freq: Every day | ORAL | Status: DC

## 2014-09-03 MED ORDER — LUBIPROSTONE 8 MCG PO CAPS: ORAL_CAPSULE | ORAL | Status: DC

## 2014-09-03 NOTE — Progress Notes (Signed)
Chief Complaint  Patient presents with  . Annual Exam    nonfasting annual exam, no pap-sees Dr.Callahan and is UTD. UA showed trace protein, patient states that she does have some urgency. She also states that she has turned into a "hungry, tired and aggravated all the time person."   Lauren Blanchard is a 44 y.o. female who presents for a complete physical.  She has the following concerns:  She complains of feeling hungry, and feels like she can no longer control herself with eating.  She reports she lost 10 pounds over the summer, and has gained it all back.  She was exercising a lot, but with a new job, she doesn't find the time to exercise.  She had also cut out gluten, cut back on portion sizes. She has been feeling more irritable, although this improved after increasing the zoloft up to 233m about 2 months ago.  IBS with constipation--taking 16 mcg of amitiza at bedtime, along with pericolace. Her bowels are very well controlled on this current regimen.Symptoms (constipation) are always a little worse cyclically (along with breast tenderness).  GERD/h/o epigastric pain: She takes Prevacid (OTC) every day, and denies any recurrent epigastric pain, reflux, or heartburn. She had some mild recurrent symptoms when she missed a pill. Denies dysphagia.  PMDD--Dose of zoloft was increased to 1565min October 2014, and had been doing better as discussed at her physical last year.  2 months ago she further increased the dose to 20047m She was irritable, having more mood swings, which still seemed cyclical.  Irritability has improved a lot, but persists to a degree.  "I expect to be Miss SunFleeta Emmerl the time, which is not realistic I guess". Denies side effects. She has a hard time staying awake while driving to work at 7:32:22 the morning.  She usually only gets 6 hours/night of sleep.  She sleeps a little more on the weekends, and sometimes naps. Gets home from work and has to fix dinner, do laundry,  clean dishes, etc.  No time for exercise.  Allergies:  Takes flonase and Allegra-D every day.  [place] [facility] [place], and still has some ongoing symptoms.  Recently took doxycycline.  Apparently they took a culture, and are waiting for results to determine if additional antibiotics are needed.  She has some ongoing sinus pain. No fevers  Vit D deficiency: She is compliant with taking daily OTC vitamin.   Immunization History  Administered Date(s) Administered  . Influenza Split 05/24/2012  . Influenza,inj,Quad PF,36+ Mos 05/01/2013  . MMR 06/07/2010, 07/08/2010  . Td 05/24/2010   Last Pap smear: saw GYN 07/2013, last pap 05/2013 Last mammogram: 07/2014 at GYN's office Last colonoscopy: 1994 (as part of eval for IBS)  Last DEXA: never  Dentist: every 6 months  Ophtho: never  Exercise: None recently.  Since starting her new job, she tries to get 2-3 days/week (fast-walking on the treadmill and weights)  Past Medical History  Diagnosis Date  . Seasonal allergies   . IBS (irritable bowel syndrome)     constipation predominant  . PONV (postoperative nausea and vomiting)     has had PONV with most of her past surgeries  . PMDD (premenstrual dysphoric disorder)     takes Zoloft for this  . Iron deficiency anemia 2014  . Epigastric pain March,2014    Takes Prevacid (pain resolved)  . Meniere disease 2001    chronic tinnitus; no vertigo since limiting sodium intake  Past Surgical History  Procedure Laterality Date  . Cesarean section      x2: 2001 and 2004  . Nasal sinus surgery      x3  . Mandible surgery  2007  . Wisdom tooth extraction    . Leep  1997  . Tubal ligation  2004  . Robotic assisted total hysterectomy N/A 01/17/2013    Procedure: ROBOTIC ASSISTED TOTAL HYSTERECTOMY;  Surgeon: Allyn Kenner, DO;  Location: [facility];  Service: Gynecology;  Laterality: N/A;  . Bilateral salpingectomy Bilateral 01/17/2013    Procedure: BILATERAL SALPINGECTOMY;   Surgeon: Allyn Kenner, DO;  Location: [facility];  Service: Gynecology;  Laterality: Bilateral;  . Cystoscopy  01/17/2013    Procedure: CYSTOSCOPY;  Surgeon: Allyn Kenner, DO;  Location: [facility];  Service: Gynecology;;    History   Social History  . Marital Status: Married    Spouse Name: N/A  . Number of Children: 3  . Years of Education: N/A   Occupational History  . dental assistant    Social History Main Topics  . Smoking status: Never Smoker   . Smokeless tobacco: Never Used  . Alcohol Use: No  . Drug Use: No  . Sexual Activity:    Partners: Male    Birth Control/ Protection: Surgical   Other Topics Concern  . Not on file   Social History Narrative   Divorced and re-married.  Lives with 3 children, husband.  Pet hedgehog.  No tobacco exposure.   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   2 caffeinated beverages/daily    Family History  Problem Relation Age of Onset  . Diabetes Mother   . Hypertension Mother   . Irritable bowel syndrome Mother   . Alzheimer's disease Father 59  . Hypertension Maternal Grandmother   . Hyperlipidemia Maternal Grandmother   . Stroke Maternal Grandfather   . Diabetes Maternal Grandfather   . Diabetes Paternal Grandmother   . Alzheimer's disease Paternal Grandmother   . Heart disease Paternal Grandfather 56    died of MI  . Turner syndrome Daughter   . Cancer Neg Hx     Outpatient Encounter Prescriptions as of 09/03/2014  Medication Sig Note  . Biotin (BIOTIN ULTRA STRENGTH) 10 MG CAPS Take 1 tablet by mouth daily.   . cholecalciferol (VITAMIN D) 1000 UNITS tablet Take 1,000 Units by mouth daily.   . fexofenadine-pseudoephedrine (ALLEGRA-D 24) 180-240 MG per 24 hr tablet Take 1 tablet by mouth daily.   . fluticasone (FLONASE) 50 MCG/ACT nasal spray Place 2 sprays into the nose daily.   Marland Kitchen ibuprofen (ADVIL,MOTRIN) 200 MG tablet Take 400-600 mg by mouth every 6 (six) hours  as needed for pain.  09/03/2014: Taking regularly recently since recent sinus pain  . Lactobacillus Rhamnosus, GG, (CULTURELLE PO) Take 1 capsule by mouth daily.   . lansoprazole (PREVACID) 15 MG capsule Take 15 mg by mouth daily.   Marland Kitchen lubiprostone (AMITIZA) 8 MCG capsule TAKE ONE CAPSULE BY MOUTH TWICE DAILY WITH A MEAL   . naphazoline-pheniramine (NAPHCON-A) 0.025-0.3 % ophthalmic solution Place 1 drop into both eyes daily. 01/04/2013: Visine allergy eye drops  . sennosides-docusate sodium (SENOKOT-S) 8.6-50 MG tablet Take 1 tablet by mouth daily. 09/03/2014: PERI-COLACE  . sertraline (ZOLOFT) 100 MG tablet Take 2 tablets (200 mg total) by mouth at bedtime.   . vitamin E (VITAMIN E) 1000 UNIT capsule Take 1,000 Units by mouth daily.   . [DISCONTINUED] lubiprostone (AMITIZA)  8 MCG capsule TAKE ONE CAPSULE BY MOUTH TWICE DAILY WITH A MEAL 09/03/2014: Takes 2 at bedtime  . [DISCONTINUED] sertraline (ZOLOFT) 100 MG tablet Take 1.5 tablets (150 mg total) by mouth daily. (Patient taking differently: Take 100 mg by mouth 2 (two) times daily. Patient is taking 200 mg a day)   . valACYclovir (VALTREX) 1000 MG tablet Take 2 tablets at onset of cold sore.  Repeat once in 12 hours (4 tablets per sore) (Patient not taking: Reported on 09/03/2014)   . [DISCONTINUED] ciprofloxacin (CIPRO) 500 MG tablet Take 1 tablet (500 mg total) by mouth 2 (two) times daily.     Allergies  Allergen Reactions  . Sulfa Antibiotics Rash    ROS: The patient denies anorexia, fever, headaches (just recent sinus headaches), vision changes, decreased hearing, ear pain, sore throat, breast concerns, chest pain, palpitations, dizziness, syncope, dyspnea on exertion, cough, swelling, vomiting, diarrhea, abdominal pain, melena, hematochezia, hematuria, dysuria, vaginal discharge, odor or itch, genital lesions, joint pains, numbness, tingling, weakness, tremor, suspicious skin lesions, abnormal bleeding/bruising, or enlarged lymph nodes.   Mild urinary urgency, no incontinence + constipation, chronic, controlled Lost 3 pounds since physical last year Chronic tinnitus, some trouble hearing low pitches Sweats at night, notices in the morning, doesn't awaken her  PHYSICAL EXAM:  BP 130/84 mmHg  Pulse 76  Ht _0  (1.651 m)  Wt 146 lb (66.225 kg)  BMI 24.30 kg/m2  LMP 01/10/2013  General Appearance:  Alert, cooperative, no distress, appears stated age   Head:  Normocephalic, without obvious abnormality, atraumatic   Eyes:  PERRL, conjunctiva/corneas clear, EOM's intact, fundi  benign   Ears:  Normal TM's and external ear canals   Nose:  Nares normal, mucosa is bloody R>L with some erythema, no purulence. No sinus tenderness   Throat:  Lips, mucosa, and tongue normal; teeth and gums normal   Neck:  Supple, no lymphadenopathy; thyroid: no enlargement/tenderness/nodules; no carotid  bruit or JVD   Back:  Spine nontender, no curvature, ROM normal, no CVA tenderness   Lungs:  Clear to auscultation bilaterally without wheezes, rales or ronchi; respirations unlabored   Chest Wall:  No tenderness or deformity   Heart:  Regular rate and rhythm, S1 and S2 normal, no murmur, rub  or gallop   Breast Exam:  Deferred to GYN   Abdomen:  Soft, nondistended, normoactive bowel sounds,  no masses, no hepatosplenomegaly.  Nontender  Genitalia:  Deferred to GYN      Extremities:  No clubbing, cyanosis or edema   Pulses:  2+ and symmetric all extremities   Skin:  Skin color, texture, turgor normal, no rashes or lesions   Lymph nodes:  Cervical, supraclavicular, and axillary nodes normal   Neurologic:  CNII-XII intact, normal strength, sensation and gait; reflexes 2+ and symmetric throughout   Psych:   Normal mood, affect, hygiene and grooming.          ASSESSMENT/PLAN:  Annual physical exam - Plan: Visual acuity screening, POCT Urinalysis Dipstick, Lipid  panel, Comprehensive metabolic panel, CBC with Differential/Platelet, Vit D  25 hydroxy (rtn osteoporosis monitoring), TSH  Vitamin D deficiency - Plan: Vit D  25 hydroxy (rtn osteoporosis monitoring)  IBS (irritable bowel syndrome) - controlled (constipation predominant) - Plan: lubiprostone (AMITIZA) 8 MCG capsule  PMDD (premenstrual dysphoric disorder) - Plan: sertraline (ZOLOFT) 100 MG tablet  Other fatigue - Plan: Lipid panel, Comprehensive metabolic panel, CBC with Differential/Platelet, Vit D  25 hydroxy (rtn osteoporosis monitoring), TSH  PMDD (  premenstrual dysphoric disorder) - improved with dose increase, but borderline control.  declines further med changes.  discussed exercise, stress reduction - Plan: sertraline (ZOLOFT) 100 MG tablet  Immunization due - Plan: Tdap vaccine greater than or equal to 7yo IM  Epigastric pain - resolved with daily PPI.  risks/side effects/benefits of chronic PPI reviewed, vs risk of untreated reflux. use lowest dose, least amt  Seasonal allergies - flaring now.  continue flonase and antihistamine/decongestant.  use saline frequently, and aim steroid away from septum.    PMDD--continue 282m zoloft.  Discussed side effects, potential to decrease dose and add in Wellbutrin. Not interested at this time.  Continue 2033mdaily.  Epigastric pain/GERD. Pain resolved with prevacid. reviewed risks of untreated reflux vs longterm PPI use. Continue MVI along with prevacid. Consider trial of tapering down on Prevacid (to every other day, and eventually prn, if tolerated). Okay to use daily long-term if needed.  Discussed monthly self breast exams and yearly mammograms after the age of 4016at least 30 minutes of aerobic activity at least 5 days/week, weight-bearing exercise 2-3x/week; proper sunscreen use reviewed; healthy diet, including goals of calcium and vitamin D intake and alcohol recommendations (less than or equal to 1 drink/day) reviewed; regular  seatbelt use; changing batteries in smoke detectors. Immunization recommendations discussed--Td was confirmed to be given in 2011, not Tdap.TdaP given today. Declines flu shot today.  Yearly flu shots are recommended. Colonoscopy recommendations reviewed--age 71.   CBC, TSH, c-met, vit D, lipid --she is nonfasting today (ate about 4 hours ago).  Can't return for fasitng labs, wants them done non-fasting. Declines HIV  We spent a fair amount of time discussing fatigue, energy.  She isn't getting adequate sleep.  She isn't getting adequate exercise.  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  Discussed different ways to incorporate exercise into her daily routine, which might help with her moods/irritability.    F/u 1 year, sooner prn

## 2014-09-03 NOTE — Patient Instructions (Signed)
  HEALTH MAINTENANCE RECOMMENDATIONS:  It is recommended that you get at least 30 minutes of aerobic exercise at least 5 days/week (for weight loss, you may need as much as 60-90 minutes). This can be any activity that gets your heart rate up. This can be divided in 10-15 minute intervals if needed, but try and build up your endurance at least once a week.  Weight bearing exercise is also recommended twice weekly.  Eat a healthy diet with lots of vegetables, fruits and fiber.  "Colorful" foods have a lot of vitamins (ie green vegetables, tomatoes, red peppers, etc).  Limit sweet tea, regular sodas and alcoholic beverages, all of which has a lot of calories and sugar.  Up to 1 alcoholic drink daily may be beneficial for women (unless trying to lose weight, watch sugars).  Drink a lot of water.  Calcium recommendations are 1200-1500 mg daily (1500 mg for postmenopausal women or women without ovaries), and vitamin D 1000 IU daily.  This should be obtained from diet and/or supplements (vitamins), and calcium should not be taken all at once, but in divided doses.  Monthly self breast exams and yearly mammograms for women over the age of 21 is recommended.  Sunscreen of at least SPF 30 should be used on all sun-exposed parts of the skin when outside between the hours of 10 am and 4 pm (not just when at beach or pool, but even with exercise, golf, tennis, and yard work!)  Use a sunscreen that says "broad spectrum" so it covers both UVA and UVB rays, and make sure to reapply every 1-2 hours.  Remember to change the batteries in your smoke detectors when changing your clock times in the spring and fall.  Use your seat belt every time you are in a car, and please drive safely and not be distracted with cell phones and texting while driving.   Please schedule a routine eye exam. Try and get at least 7-9 hours/sleep.

## 2014-09-04 LAB — VITAMIN D 25 HYDROXY (VIT D DEFICIENCY, FRACTURES): VIT D 25 HYDROXY: 38 ng/mL (ref 30–100)

## 2015-05-29 ENCOUNTER — Other Ambulatory Visit: Payer: Self-pay | Admitting: Medical

## 2015-05-29 ENCOUNTER — Telehealth: Payer: Self-pay | Admitting: Family Medicine

## 2015-05-29 DIAGNOSIS — B001 Herpesviral vesicular dermatitis: Secondary | ICD-10-CM

## 2015-05-29 MED ORDER — VALACYCLOVIR HCL 1 G PO TABS: ORAL_TABLET | ORAL | Status: DC

## 2015-05-29 NOTE — Telephone Encounter (Signed)
Called pt and advised that script has been sent to her pharmacy

## 2015-05-29 NOTE — Telephone Encounter (Signed)
done

## 2015-05-29 NOTE — Telephone Encounter (Signed)
Pt is getting a fever blister. Requesting a refill on Valacyclovir 1000mg  to [facility] @ [facility] @ [place]

## 2015-08-10 ENCOUNTER — Encounter: Payer: Self-pay | Admitting: Family Medicine

## 2015-08-10 ENCOUNTER — Ambulatory Visit (INDEPENDENT_AMBULATORY_CARE_PROVIDER_SITE_OTHER): Admitting: Family Medicine

## 2015-08-10 VITALS — BP 110/84 | HR 88 | Ht 65.0 in | Wt 161.2 lb

## 2015-08-10 DIAGNOSIS — H8109 Meniere's disease, unspecified ear: Secondary | ICD-10-CM | POA: Diagnosis not present

## 2015-08-10 DIAGNOSIS — H8110 Benign paroxysmal vertigo, unspecified ear: Secondary | ICD-10-CM

## 2015-08-10 NOTE — Progress Notes (Signed)
Chief Complaint  Patient presents with  . Dizziness    since Jul 18, 2015. Randomly, and when she moves quickly it is worse. Feel like "her brain is moving" at times. Hard to describe. Worse when he lays on her back or right side.   . Flu Vaccine    unsure if she wants one today.     Symptoms started 1/7. She rolled over onto her right side 1/7 and the room started spinning.  Dizziness lasted all day long.  She went to UC shortly after, and was treated with Augmentin for a sinus infection, as well as a steroid shot.  The dizziness persists, but isn't as bad as it was.  She worries that she still has an ongoing sinus infection. She can't turn her head to the right or else she will get dizzy.  If she moves quickly, she has to be very careful. URI symptoms have improved.  She hasn't been able to get anything out with the Neti-pot.  Sometimes she sees specks of green when she blows her nose, just a few times.  She has some pressure in her ears, and some discomfort down the right neck and headaches at her right temple.  She has some pressure in her forehead. +tinnitus.  OTC meds--using Flonase and Allegra-D She gets too drowsy from meclizine, can't take it during the day.  She doesn't recall the strength.  She had similar problem in 2002.  She had gone to ENT and was diagnosed with Meniere's disease.  She cut salt out of her diet, and symptoms had improved.  Hadn't had any problems since then (symptoms then lasted for 6 months).  Past Medical History  Diagnosis Date  . Seasonal allergies   . IBS (irritable bowel syndrome)     constipation predominant  . PONV (postoperative nausea and vomiting)     has had PONV with most of her past surgeries  . PMDD (premenstrual dysphoric disorder)     takes Zoloft for this  . Iron deficiency anemia 2014  . Epigastric pain March,2014    Takes Prevacid (pain resolved)  . Meniere disease 2001    chronic tinnitus; no vertigo since limiting sodium intake    Outpatient Encounter Prescriptions as of 08/10/2015  Medication Sig  . Biotin (BIOTIN ULTRA STRENGTH) 10 MG CAPS Take 1 tablet by mouth daily.  . cholecalciferol (VITAMIN D) 1000 UNITS tablet Take 1,000 Units by mouth daily.  . fexofenadine-pseudoephedrine (ALLEGRA-D 24) 180-240 MG per 24 hr tablet Take 1 tablet by mouth daily.  . fluticasone (FLONASE) 50 MCG/ACT nasal spray Place 2 sprays into the nose daily.  . Lactobacillus Rhamnosus, GG, (CULTURELLE PO) Take 1 capsule by mouth daily.  . lansoprazole (PREVACID) 15 MG capsule Take 15 mg by mouth daily.  Marland Kitchen lubiprostone (AMITIZA) 8 MCG capsule TAKE ONE CAPSULE BY MOUTH TWICE DAILY WITH A MEAL  . mupirocin ointment (BACTROBAN) 2 % Apply 1 application topically as needed.   . naphazoline-pheniramine (NAPHCON-A) 0.025-0.3 % ophthalmic solution Place 1 drop into both eyes daily.  . sennosides-docusate sodium (SENOKOT-S) 8.6-50 MG tablet Take 1 tablet by mouth daily.  . sertraline (ZOLOFT) 100 MG tablet Take 2 tablets (200 mg total) by mouth at bedtime.  . vitamin E (VITAMIN E) 1000 UNIT capsule Take 1,000 Units by mouth daily.  Marland Kitchen ibuprofen (ADVIL,MOTRIN) 200 MG tablet Take 400-600 mg by mouth every 6 (six) hours as needed for pain. Reported on 08/10/2015  . valACYclovir (VALTREX) 1000 MG tablet Take 2  tablets at onset of cold sore.  Repeat once in 12 hours (4 tablets per sore) (Patient not taking: Reported on 08/10/2015)   No facility-administered encounter medications on file as of 08/10/2015.   ROS: no headache, syncope. +bertigo as per HPI. +nausea, no vomiting.  No diarrhea, abdominal pain (just her usual IBS, unchanged), fever, chills. +night sweats/perimenopausal symptoms.   PHYSICAL EXAM: BP 110/84 mmHg  Pulse 88  Ht 5\' 5"  (1.651 m)  Wt 161 lb 3.2 oz (73.12 kg)  BMI 26.83 kg/m2  LMP 01/10/2013  Well developed, well-appearing, pleasant female in no distress.  In moderate distress with vertigo with head position changes. HEENT: PERRL,  EOMI, conjunctiva clear.  TM's and EAc's normal bilaterally.  Nasal mucosa is mildly edematous, with areas of recent bleeding bilaterally. No erythema or purulence.  Sinuses nontender. Temporalis muscle and temporal arteries nontender Neck: no lymphadenopathy, thyromegaly or mass, no bruit. Heart: regular rate and rhythm without  Murmur Lungs: clear bilaterally Neuro: alert and oriented. Cranial nerves intact. Normal strength, gait, DTR's 2+. No vertigo with head turned to the left (sitting or laying). Had vertigo develop when laying with facing forward, worse upon sitting up. Had vertigo develop when laying with head turned to the right, also worse upon sitting up. Minimal nystagmus noted   ASSESSMENT/PLAN:  BPV (benign positional vertigo), unspecified laterality - Plan: Referral to Neuro Rehab  Meniere disease, unspecified laterality  She has h/o Meniere's, with symptoms of ear fullness and tinnitus, along with vertigo, so suggest possible recurrence.  But she developed symptoms in the setting of a sinus infection, with clearly positional component.  Discussed treatment options--prefers to try PT/rehab first, and then f/u with ENT for Meniere's if persistent symptoms.  She had tried to call ENT directly, but they required her to see PCP first.  Trial of lower dose meclizine prn; low sodium diet. Available for PT today or Friday If not better with PT, refer back to ENT for treatment of meniere's.  Prefers to try another ENT (previously saw Ruby Cola at [facility]) Would be ok with another provider, vs elsewhere.

## 2015-08-10 NOTE — Patient Instructions (Signed)
Try and avoid rapid head movements. Sleep on your left side. Use meclizine as needed for vertigo--12.5mg  is the lowest dose. Start with this, and if too sedating, you can cut it in half.  In the evenings, you can use up to 25mg  (when it is okay if you get sleepy--not driving).  We will refer you for rehab to see if that helps with the vertigo. If it doesn't, it might be flare of your Meniere's and recommend follow up with ENT to be started on medications (likely diuretic medications that you will need to take long-term). Let us know if we need to make the appointment for you.

## 2015-08-12 DIAGNOSIS — N83209 Unspecified ovarian cyst, unspecified side: Secondary | ICD-10-CM

## 2015-08-12 HISTORY — DX: Unspecified ovarian cyst, unspecified side: N83.209

## 2015-08-14 ENCOUNTER — Ambulatory Visit: Attending: Family Medicine | Admitting: Rehabilitative and Restorative Service Providers"

## 2015-08-14 DIAGNOSIS — H8111 Benign paroxysmal vertigo, right ear: Secondary | ICD-10-CM | POA: Diagnosis present

## 2015-08-14 NOTE — Therapy (Signed)
[facility] [address] [place], [place], [address] Phone: [phone]   Fax:  [phone]  Physical Therapy Evaluation  Patient Details  Name: Lauren Blanchard MRN: [id number] Date of Birth: 05-May-1971 Referring Provider: Rita Ohara, MD  Encounter Date: 08/14/2015      PT End of Session - 08/14/15 0949    Visit Number 1   Number of Visits 4   Date for PT Re-Evaluation 09/13/15   Authorization Type tricare   PT Start Time 0803   PT Stop Time 0845   PT Time Calculation (min) 42 min   Activity Tolerance Patient tolerated treatment well   Behavior During Therapy [facility] for tasks assessed/performed      Past Medical History  Diagnosis Date  . Seasonal allergies   . IBS (irritable bowel syndrome)     constipation predominant  . PONV (postoperative nausea and vomiting)     has had PONV with most of her past surgeries  . PMDD (premenstrual dysphoric disorder)     takes Zoloft for this  . Iron deficiency anemia 2014  . Epigastric pain March,2014    Takes Prevacid (pain resolved)  . Meniere disease 2001    chronic tinnitus; no vertigo since limiting sodium intake    Past Surgical History  Procedure Laterality Date  . Cesarean section      x2: 2001 and 2004  . Nasal sinus surgery      x3  . Mandible surgery  2007  . Wisdom tooth extraction    . Leep  1997  . Tubal ligation  2004  . Robotic assisted total hysterectomy N/A 01/17/2013    Procedure: ROBOTIC ASSISTED TOTAL HYSTERECTOMY;  Surgeon: Allyn Kenner, DO;  Location: [facility];  Service: Gynecology;  Laterality: N/A;  . Bilateral salpingectomy Bilateral 01/17/2013    Procedure: BILATERAL SALPINGECTOMY;  Surgeon: Allyn Kenner, DO;  Location: [facility];  Service: Gynecology;  Laterality: Bilateral;  . Cystoscopy  01/17/2013    Procedure: CYSTOSCOPY;  Surgeon: Allyn Kenner, DO;  Location: [facility];  Service: Gynecology;;    There were no vitals filed for this  visit.  Visit Diagnosis:  BPPV (benign paroxysmal positional vertigo), right      Subjective Assessment - 08/14/15 0806    Subjective The patient reports sudden onset of vertigo 07/18/2015 when rolling in bed.  She reports a spinning sensation that lasts for seconds to a minute.  It is provoked during bending forward, lying her head back, or rolling to the right side.  She has a h/o Meniere's disease but has not had a Meniere's attack for over a decade.  She has (+) h/o tinnitus bilaterally, (+) ear fullness.  She also has h/o headaches.  Onset of symptoms occurred after a cold/sinus issue.    Pertinent History IBS, Meniere's disease.   Patient Stated Goals Get rid of the vertigo.             [facility] PT Assessment - 08/14/15 0809    Assessment   Medical Diagnosis BPPV   Referring Provider Rita Ohara, MD   Onset Date/Surgical Date 07/18/15   Prior Therapy none   Precautions   Precautions None   Restrictions   Weight Bearing Restrictions No   Balance Screen   Has the patient fallen in the past 6 months No   Has the patient had a decrease in activity level because of a fear of falling?  No   Is the patient reluctant to leave their home because  of a fear of falling?  No   Home Environment   Living Environment Private residence   Prior Function   Level of Independence Independent   Observation/Other Assessments   Focus on Therapeutic Outcomes (FOTO)  62 functional status survey   Other Surveys  --  DHI=48%            Vestibular Assessment - 08/14/15 0810    Vestibular Assessment   General Observation She reports some sensations of feeling that dizziness could begin, but does not come one.  H/o motion sickness associated with Meniere's disease.   Symptom Behavior   Type of Dizziness Spinning   Frequency of Dizziness daily   Duration of Dizziness seconds to minutes   Aggravating Factors Activity in general;Turning head quickly;Rolling to right;Forward bending   Relieving  Factors Head stationary;Lying supine   Occulomotor Exam   Occulomotor Alignment Normal   Spontaneous Absent   Gaze-induced Absent   Smooth Pursuits Saccades  noted only during vertical tracking   Saccades Intact   Comment convergence- decreased eye adduction bilaterally to target   Vestibulo-Occular Reflex   VOR 1 Head Only (x 1 viewing) provokes "hard to focus" sensation   Comment head impulse test negative bilaterally   Positional Testing   Dix-Hallpike Dix-Hallpike Right;Dix-Hallpike Left   Horizontal Canal Testing Horizontal Canal Right;Horizontal Canal Left   Dix-Hallpike Right   Dix-Hallpike Right Duration 30 seconds   Dix-Hallpike Right Symptoms Upbeat, right rotatory nystagmus   Dix-Hallpike Left   Dix-Hallpike Left Duration none   Dix-Hallpike Left Symptoms No nystagmus   Horizontal Canal Right   Horizontal Canal Right Duration none   Horizontal Canal Right Symptoms Normal   Horizontal Canal Left   Horizontal Canal Left Duration none   Horizontal Canal Left Symptoms Normal                Vestibular Treatment/Exercise - 08/14/15 [id number]    Vestibular Treatment/Exercise   Vestibular Treatment Provided Canalith Repositioning   Canalith Repositioning Epley Manuever Right    EPLEY MANUEVER RIGHT   Number of Reps  2   Response Details  During 1st rep of Epley Maneuver, the patient experienced 30+ seconds of nystagmus in R dix hallpike position.  She also got a trace of dizziness when rotating head towards the left side.  She needed to rest x 5 minutes in between reps of Epley's maneuver. During 2nd rep, the patient did not experience dizziness or nystagmus.  Took her back through positions- no symptoms noted..               PT Education - 08/14/15 0845    Education provided Yes   Education Details handout from [facility] on BPPV   Person(s) Educated Patient   Methods Explanation;Handout   Comprehension Verbalized understanding          PT Short Term Goals -  08/14/15 0950    PT SHORT TERM GOAL #1   Title STGs=LTGs           PT Long Term Goals - 08/14/15 0835    PT LONG TERM GOAL #1   Title The patient will return demo HEP for gaze, habituation activities as indicated.   Baseline Target date 09/11/2015   Time 4   Period Weeks   PT LONG TERM GOAL #2   Title The patient will have negative R dix hallpike test indicating resolution of BPPV.   Baseline Target date 09/11/2015   Time 4   Period Weeks  PT LONG TERM GOAL #3   Title The patient will reduce DHI from 48% to < or equal to 30% to demo dec'd self perception of dizziness.   Baseline Target date 09/11/2015   Time 4   Period Weeks               Plan - 08/14/15 [id number]    Clinical Impression Statement The patient is a 45 yo female presenting to outpatient therapy today with positive testing for R posterior canalithiasis BPPV.  She had some saccadic eye movement noted during vertical smooth pursuits, which is not typical for peripheral inner ear presentation.  PT to reassess symptoms after treating BPPV to ensure no further clinical  symptoms.   Pt will benefit from skilled therapeutic intervention in order to improve on the following deficits Dizziness;Decreased mobility;Difficulty walking  during episodes of dizziness   Rehab Potential Good   PT Frequency 1x / week   PT Duration 4 weeks   PT Treatment/Interventions Vestibular;Canalith Repostioning;Therapeutic activities;Therapeutic exercise;Functional mobility training;Balance training;Neuromuscular re-education;Patient/family education;Gait training   PT Next Visit Plan Reassess R BPPV/ treat as indicated.  Reassess smooth pursuits.  Do SVA vs. DVA if any symptoms remain.  May benefit from VOR x 1 viewing exercises. Behavioral education to decrease guarding in head/neck.   Consulted and Agree with Plan of Care Patient         Problem List Patient Active Problem List   Diagnosis Date Noted  . Anemia, iron deficiency 10/17/2012   . IBS (irritable bowel syndrome) 10/17/2012  . Herpes labialis 10/17/2012  . Vitamin D deficiency 10/17/2012  . Urge urinary incontinence 08/28/2012  . PMDD (premenstrual dysphoric disorder) 08/28/2012  . Epigastric pain 05/24/2012    Maura Braaten, PT 08/14/2015, 10:13 AM  [facility] [address] [place], [place], [address] Phone: [phone]   Fax:  [phone]  Name: Lauren Blanchard MRN: [id number] Date of Birth: [birth year]

## 2015-08-21 ENCOUNTER — Ambulatory Visit: Admitting: Physical Therapy

## 2015-08-22 ENCOUNTER — Encounter (HOSPITAL_COMMUNITY): Payer: Self-pay | Admitting: Oncology

## 2015-08-22 ENCOUNTER — Emergency Department (HOSPITAL_COMMUNITY)

## 2015-08-22 ENCOUNTER — Emergency Department (HOSPITAL_COMMUNITY)
Admission: EM | Admit: 2015-08-22 | Discharge: 2015-08-23 | Disposition: A | Discharge status: Home / Self Care | Attending: Emergency Medicine | Admitting: Emergency Medicine

## 2015-08-22 DIAGNOSIS — Z8669 Personal history of other diseases of the nervous system and sense organs: Secondary | ICD-10-CM | POA: Diagnosis not present

## 2015-08-22 DIAGNOSIS — Z8659 Personal history of other mental and behavioral disorders: Secondary | ICD-10-CM | POA: Diagnosis not present

## 2015-08-22 DIAGNOSIS — Z862 Personal history of diseases of the blood and blood-forming organs and certain disorders involving the immune mechanism: Secondary | ICD-10-CM | POA: Insufficient documentation

## 2015-08-22 DIAGNOSIS — K5900 Constipation, unspecified: Secondary | ICD-10-CM | POA: Insufficient documentation

## 2015-08-22 DIAGNOSIS — K589 Irritable bowel syndrome without diarrhea: Secondary | ICD-10-CM | POA: Insufficient documentation

## 2015-08-22 DIAGNOSIS — N83209 Unspecified ovarian cyst, unspecified side: Secondary | ICD-10-CM | POA: Insufficient documentation

## 2015-08-22 DIAGNOSIS — R102 Pelvic and perineal pain: Secondary | ICD-10-CM

## 2015-08-22 DIAGNOSIS — Z7951 Long term (current) use of inhaled steroids: Secondary | ICD-10-CM | POA: Insufficient documentation

## 2015-08-22 DIAGNOSIS — R000 Tachycardia, unspecified: Secondary | ICD-10-CM | POA: Diagnosis not present

## 2015-08-22 DIAGNOSIS — Z79899 Other long term (current) drug therapy: Secondary | ICD-10-CM | POA: Insufficient documentation

## 2015-08-22 DIAGNOSIS — R1031 Right lower quadrant pain: Secondary | ICD-10-CM | POA: Diagnosis present

## 2015-08-22 LAB — CBC WITH DIFFERENTIAL/PLATELET
Basophils Absolute: 0 10*3/uL (ref 0.0–0.1)
Basophils Relative: 0 %
EOS ABS: 0.1 10*3/uL (ref 0.0–0.7)
EOS PCT: 1 %
HCT: 35 % — ABNORMAL LOW (ref 36.0–46.0)
Hemoglobin: 12 g/dL (ref 12.0–15.0)
LYMPHS ABS: 2.4 10*3/uL (ref 0.7–4.0)
Lymphocytes Relative: 17 %
MCH: 29.8 pg (ref 26.0–34.0)
MCHC: 34.3 g/dL (ref 30.0–36.0)
MCV: 86.8 fL (ref 78.0–100.0)
MONO ABS: 1 10*3/uL (ref 0.1–1.0)
MONOS PCT: 7 %
Neutro Abs: 11.1 10*3/uL — ABNORMAL HIGH (ref 1.7–7.7)
Neutrophils Relative %: 75 %
PLATELETS: 360 10*3/uL (ref 150–400)
RBC: 4.03 MIL/uL (ref 3.87–5.11)
RDW: 13.1 % (ref 11.5–15.5)
WBC: 14.6 10*3/uL — AB (ref 4.0–10.5)

## 2015-08-22 LAB — COMPREHENSIVE METABOLIC PANEL
ALT: 21 U/L (ref 14–54)
ANION GAP: 12 (ref 5–15)
AST: 26 U/L (ref 15–41)
Albumin: 4.7 g/dL (ref 3.5–5.0)
Alkaline Phosphatase: 58 U/L (ref 38–126)
BUN: 16 mg/dL (ref 6–20)
CHLORIDE: 103 mmol/L (ref 101–111)
CO2: 22 mmol/L (ref 22–32)
Calcium: 9.6 mg/dL (ref 8.9–10.3)
Creatinine, Ser: 0.67 mg/dL (ref 0.44–1.00)
Glucose, Bld: 128 mg/dL — ABNORMAL HIGH (ref 65–99)
Potassium: 3.3 mmol/L — ABNORMAL LOW (ref 3.5–5.1)
SODIUM: 137 mmol/L (ref 135–145)
Total Bilirubin: 0.5 mg/dL (ref 0.3–1.2)
Total Protein: 7.6 g/dL (ref 6.5–8.1)

## 2015-08-22 LAB — URINALYSIS, ROUTINE W REFLEX MICROSCOPIC
BILIRUBIN URINE: NEGATIVE
Glucose, UA: NEGATIVE mg/dL
HGB URINE DIPSTICK: NEGATIVE
Ketones, ur: NEGATIVE mg/dL
NITRITE: NEGATIVE
PROTEIN: NEGATIVE mg/dL
SPECIFIC GRAVITY, URINE: 1.02 (ref 1.005–1.030)
pH: 6 (ref 5.0–8.0)

## 2015-08-22 LAB — URINE MICROSCOPIC-ADD ON

## 2015-08-22 LAB — I-STAT TROPONIN, ED: TROPONIN I, POC: 0 ng/mL (ref 0.00–0.08)

## 2015-08-22 LAB — LIPASE, BLOOD: LIPASE: 24 U/L (ref 11–51)

## 2015-08-22 MED ORDER — ONDANSETRON HCL 4 MG/2ML IJ SOLN
4.0000 mg | Freq: Once | INTRAMUSCULAR | Status: AC
  Administered 2015-08-22: 4 mg via INTRAVENOUS
  Filled 2015-08-22: qty 2

## 2015-08-22 MED ORDER — HYDROMORPHONE HCL 1 MG/ML IJ SOLN
1.0000 mg | Freq: Once | INTRAMUSCULAR | Status: AC
  Administered 2015-08-22: 1 mg via INTRAVENOUS
  Filled 2015-08-22: qty 1

## 2015-08-22 MED ORDER — SODIUM CHLORIDE 0.9 % IV BOLUS (SEPSIS)
1000.0000 mL | Freq: Once | INTRAVENOUS | Status: AC
Start: 2015-08-22 — End: 2015-08-22
  Administered 2015-08-22: 1000 mL via INTRAVENOUS

## 2015-08-22 MED ORDER — IOHEXOL 300 MG/ML  SOLN
100.0000 mL | Freq: Once | INTRAMUSCULAR | Status: AC | PRN
  Administered 2015-08-22: 100 mL via INTRAVENOUS

## 2015-08-22 MED ORDER — IOHEXOL 300 MG/ML  SOLN: 25.0000 mL | Freq: Once | INTRAMUSCULAR | Status: DC | PRN

## 2015-08-22 NOTE — ED Notes (Signed)
In Xray/CT

## 2015-08-22 NOTE — ED Provider Notes (Signed)
CSN: [id number]     Arrival date & time 08/22/15  2042 History   First MD Initiated Contact with Patient 08/22/15 2110     Chief Complaint  Patient presents with  . Abdominal Pain     (Consider location/radiation/quality/duration/timing/severity/associated sxs/prior Treatment) Patient is a 45 y.o. female presenting with abdominal pain. The history is provided by the patient.  Abdominal Pain Pain location:  RLQ, RUQ, periumbilical and suprapubic Pain quality: aching and sharp   Pain radiates to:  Back and R shoulder Pain severity:  Moderate Onset quality:  Gradual Duration:  1 day Timing:  Constant Progression:  Worsening Chronicity:  New Context: awakening from sleep (this morning)   Context: not previous surgeries   Relieved by:  Nothing Worsened by:  Nothing tried Ineffective treatments:  None tried Associated symptoms: no anorexia, no fever, no vaginal bleeding, no vaginal discharge and no vomiting   Risk factors: not pregnant (s/p hysterectomy)     Past Medical History  Diagnosis Date  . Seasonal allergies   . IBS (irritable bowel syndrome)     constipation predominant  . PONV (postoperative nausea and vomiting)     has had PONV with most of her past surgeries  . PMDD (premenstrual dysphoric disorder)     takes Zoloft for this  . Iron deficiency anemia 2014  . Epigastric pain March,2014    Takes Prevacid (pain resolved)  . Meniere disease 2001    chronic tinnitus; no vertigo since limiting sodium intake   Past Surgical History  Procedure Laterality Date  . Cesarean section      x2: 2001 and 2004  . Nasal sinus surgery      x3  . Mandible surgery  2007  . Wisdom tooth extraction    . Leep  1997  . Tubal ligation  2004  . Robotic assisted total hysterectomy N/A 01/17/2013    Procedure: ROBOTIC ASSISTED TOTAL HYSTERECTOMY;  Surgeon: Allyn Kenner, DO;  Location: [facility];  Service: Gynecology;  Laterality: N/A;  . Bilateral salpingectomy Bilateral 01/17/2013     Procedure: BILATERAL SALPINGECTOMY;  Surgeon: Allyn Kenner, DO;  Location: [facility];  Service: Gynecology;  Laterality: Bilateral;  . Cystoscopy  01/17/2013    Procedure: CYSTOSCOPY;  Surgeon: Allyn Kenner, DO;  Location: [facility];  Service: Gynecology;;   Family History  Problem Relation Age of Onset  . Diabetes Mother   . Hypertension Mother   . Irritable bowel syndrome Mother   . Alzheimer's disease Father 75  . Hypertension Maternal Grandmother   . Hyperlipidemia Maternal Grandmother   . Stroke Maternal Grandfather   . Diabetes Maternal Grandfather   . Diabetes Paternal Grandmother   . Alzheimer's disease Paternal Grandmother   . Heart disease Paternal Grandfather 42    died of MI  . Turner syndrome Daughter   . Cancer Neg Hx    Social History  Substance Use Topics  . Smoking status: Never Smoker   . Smokeless tobacco: Never Used  . Alcohol Use: No   OB History    Gravida Para Term Preterm AB TAB SAB Ectopic Multiple Living   4 3   1  1   3      Review of Systems  Constitutional: Negative for fever.  Gastrointestinal: Positive for abdominal pain. Negative for vomiting and anorexia.  Genitourinary: Negative for vaginal bleeding and vaginal discharge.  All other systems reviewed and are negative.     Allergies  Sulfa antibiotics  Home Medications   Prior to Admission  medications   Medication Sig Start Date End Date Taking? Authorizing Provider  Biotin (BIOTIN ULTRA STRENGTH) 10 MG CAPS Take 1 tablet by mouth daily.    Historical Provider, MD  cholecalciferol (VITAMIN D) 1000 UNITS tablet Take 1,000 Units by mouth daily.    Historical Provider, MD  fexofenadine-pseudoephedrine (ALLEGRA-D 24) 180-240 MG per 24 hr tablet Take 1 tablet by mouth daily.    Historical Provider, MD  fluticasone (FLONASE) 50 MCG/ACT nasal spray Place 2 sprays into the nose daily.    Historical Provider, MD  ibuprofen (ADVIL,MOTRIN) 200 MG tablet Take 400-600 mg by mouth every 6 (six)  hours as needed for pain. Reported on 08/10/2015    Historical Provider, MD  Lactobacillus Rhamnosus, GG, (CULTURELLE PO) Take 1 capsule by mouth daily.    Historical Provider, MD  lansoprazole (PREVACID) 15 MG capsule Take 15 mg by mouth daily.    Historical Provider, MD  lubiprostone (AMITIZA) 8 MCG capsule TAKE ONE CAPSULE BY MOUTH TWICE DAILY WITH A MEAL 09/03/14   Rita Ohara, MD  mupirocin ointment (BACTROBAN) 2 % Apply 1 application topically as needed.  07/28/15   Historical Provider, MD  naphazoline-pheniramine (NAPHCON-A) 0.025-0.3 % ophthalmic solution Place 1 drop into both eyes daily.    Historical Provider, MD  sennosides-docusate sodium (SENOKOT-S) 8.6-50 MG tablet Take 1 tablet by mouth daily.    Historical Provider, MD  sertraline (ZOLOFT) 100 MG tablet Take 2 tablets (200 mg total) by mouth at bedtime. 09/03/14   Rita Ohara, MD  valACYclovir (VALTREX) 1000 MG tablet Take 2 tablets at onset of cold sore.  Repeat once in 12 hours (4 tablets per sore) 05/29/15   Camelia Eng Tysinger, PA-C  vitamin E (VITAMIN E) 1000 UNIT capsule Take 1,000 Units by mouth daily.    Historical Provider, MD   BP 109/72 mmHg  Pulse 120  Temp(Src) 98.5 F (36.9 C) (Oral)  Resp 24  SpO2 100%  LMP 01/10/2013 Physical Exam  Constitutional: She is oriented to person, place, and time. She appears well-developed and well-nourished. No distress.  HENT:  Head: Normocephalic.  Eyes: Conjunctivae are normal.  Neck: Neck supple. No tracheal deviation present.  Cardiovascular: Regular rhythm.  Tachycardia present.   Pulmonary/Chest: Effort normal. No respiratory distress. She has no wheezes. She has no rhonchi.  Abdominal: Soft. She exhibits no distension. There is tenderness in the right upper quadrant, right lower quadrant, periumbilical area and suprapubic area. There is guarding. There is no rigidity, no rebound and no tenderness at McBurney's point.  Neurological: She is alert and oriented to person, place, and  time.  Skin: Skin is warm and dry.  Psychiatric: She has a normal mood and affect.    ED Course  Procedures (including critical care time) Labs Review Labs Reviewed  CBC WITH DIFFERENTIAL/PLATELET - Abnormal; Notable for the following:    WBC 14.6 (*)    HCT 35.0 (*)    Neutro Abs 11.1 (*)    All other components within normal limits  COMPREHENSIVE METABOLIC PANEL - Abnormal; Notable for the following:    Potassium 3.3 (*)    Glucose, Bld 128 (*)    All other components within normal limits  URINALYSIS, ROUTINE W REFLEX MICROSCOPIC (NOT AT [facility]) - Abnormal; Notable for the following:    Leukocytes, UA SMALL (*)    All other components within normal limits  URINE MICROSCOPIC-ADD ON - Abnormal; Notable for the following:    Squamous Epithelial / LPF 0-5 (*)  Bacteria, UA RARE (*)    All other components within normal limits  LIPASE, BLOOD  I-STAT TROPOININ, ED  I-STAT CG4 LACTIC ACID, ED  I-STAT TROPOININ, ED    Imaging Review Ct Abdomen Pelvis W Contrast  08/22/2015  CLINICAL DATA:  45 year old female with diffuse right-sided abdominal pain. EXAM: CT ABDOMEN AND PELVIS WITH CONTRAST TECHNIQUE: Multidetector CT imaging of the abdomen and pelvis was performed using the standard protocol following bolus administration of intravenous contrast. CONTRAST:  152mL OMNIPAQUE IOHEXOL 300 MG/ML  SOLN COMPARISON:  None. FINDINGS: The visualized lung bases are clear. No intra-abdominal free air. There is small hemoperitoneum with within the abdomen and pelvis. The liver, gallbladder, pancreas, spleen, adrenal glands, kidneys, visualized ureters, and urinary bladder appear unremarkable. Hysterectomy. There is a 5.8 x 4.1 cm ovoid high density structure in the right hemipelvis and in the region of the right adnexa suspicious for a hemorrhagic cyst or a hemorrhagic ovarian lesion. Evaluation of this lesion is limited due to surrounding hematoma and small blood clot within the pelvis. Ultrasound is  recommended for better evaluation. There is no evidence of bowel obstruction or inflammation. A small tubular structure noted medial to the cecum represents a normal appendix. The abdominal aorta and IVC appear patent. No portal venous gas identified. There is no adenopathy. The abdominal wall soft tissues appear unremarkable. The osseous structures are intact. IMPRESSION: High attenuating right adnexal mass concerning for a hemorrhagic lesion. Ultrasound is recommended for further evaluation. Small hemoperitoneum within the abdomen and pelvis as well as small amount of blood clot within the pelvis. No evidence of bowel obstruction or inflammation. No CT evidence of acute appendicitis. Electronically Signed   By: Anner Crete M.D.   On: 08/22/2015 23:29   I have personally reviewed and evaluated these images and lab results as part of my medical decision-making.   EKG Interpretation   Date/Time:  Saturday August 22 2015 21:21:19 EST Ventricular Rate:  107 PR Interval:  129 QRS Duration: 83 QT Interval:  335 QTC Calculation: 447 R Axis:   72 Text Interpretation:  Sinus tachycardia borderline repolarization  abnormality Otherwise normal ECG No previous tracing Confirmed by Reyan Helle  MD, Quillian Quince [id number]) on 08/22/2015 11:01:29 PM      MDM   Final diagnoses:  Pelvic pain in female    45 y.o. female presents with abdominal pain that started in her suprapubic region and has since migrated into the right side of her abdomen and radiates now to her right shoulder. She is in severe pain, is tachycardic and is diffusely tender over her abdomen with limited focal pain. She has  Signs suggestive of possible peritonitis. She is afebrile and otherwise hemodynamically stable. Differential diagnosis considerations include cholecystitis, appendicitis, nephrolithiasis and renal colic.    CT scan demonstrates hemorrhagic lesion in the pelvis and radiology is recommending imaging with ultrasound. I discussed  the patient with on-call OB/GYN who recommended getting ultrasound , if pain is related to hemorrhagic cyst then pain control and outpatient follow-up for repeat hemoglobin testing would be appropriate. If the ultrasound demonstrates torsion then the patient will require transfer for surgical consultation.  Care transferred to Dr Tomi Bamberger at 1230 AM with plan to follow up pelvic ultrasound results and gynecology as indicated.  Leo Grosser, MD 08/23/15 [phone]

## 2015-08-22 NOTE — ED Notes (Signed)
Nurse will get labs 

## 2015-08-22 NOTE — ED Notes (Signed)
Per pt's husband pt c/o severe abdominal pain, b/l shoulder pain, neck pain, diaphoresis and nausea.  Pt is barely able to sit still in triage d/t the pain.

## 2015-08-23 LAB — I-STAT CG4 LACTIC ACID, ED: Lactic Acid, Venous: 1.5 mmol/L (ref 0.5–2.0)

## 2015-08-23 MED ORDER — ONDANSETRON HCL 4 MG/2ML IJ SOLN
4.0000 mg | Freq: Once | INTRAMUSCULAR | Status: AC
  Administered 2015-08-23: 4 mg via INTRAVENOUS
  Filled 2015-08-23: qty 2

## 2015-08-23 MED ORDER — OXYCODONE-ACETAMINOPHEN 5-325 MG PO TABS: 1.0000 | ORAL_TABLET | ORAL | Status: DC | PRN

## 2015-08-23 MED ORDER — OXYCODONE-ACETAMINOPHEN 5-325 MG PO TABS
1.0000 | ORAL_TABLET | Freq: Once | ORAL | Status: AC
  Administered 2015-08-23: 1 via ORAL
  Filled 2015-08-23: qty 1

## 2015-08-23 NOTE — ED Provider Notes (Signed)
By signing my name below, I, Rowan Blase, attest that this documentation has been prepared under the direction and in the presence of No att. providers found . Electronically Signed: Rowan Blase, Scribe. 08/23/2015. 3:09 AM.  HPI Comments:  Daraly Cheadle is a 45 y.o. female who presents to the Emergency Department complaining of abdominal pain. Pt left at change of shift to get [place] result She states her OB/GYN is Dr. Jake Shark at [facility].  Physical Exam   COORDINATION OF CARE:  3:04 AM Discussed imaging results with patients. Recommended follow-up with GYN. Informed pt these cysts usually resolve on their own. Patient is questioning how she can go home "with this kind of pain". She was advised should be given pain pills take at  home. She questioned what kind and when she was told Percocet she states "they make me constipated". She was advised that all other narcotic pain medicine will make her constipated. She take MiraLAX daily to help prevent constipation. She was given a Percocet in the ED to make sure it would control her pain, if it did not I was going to  talk to her gynecologist again, patient seems to want something done tonight to resolve this hemorrhagic cyst.  Recheck at 5:15 AM patient states the Percocet is controlling her pain. She is willing to go home and follow-up with her gynecologist this week. Her husband was in the room and he was advised if she gets worse to take her to [facility] for reevaluation.   US Transvaginal Non-ob  US Pelvis Complete  08/23/2015  CLINICAL DATA:  44 year old female left right pelvic pain. History of prior hysterectomy and salpingectomy EXAM: TRANSABDOMINAL AND TRANSVAGINAL ULTRASOUND OF PELVIS TECHNIQUE: Both transabdominal and transvaginal ultrasound examinations of the pelvis were performed. Transabdominal technique was performed for global imaging of the pelvis including uterus, ovaries, adnexal regions, and pelvic  cul-de-sac. It was necessary to proceed with endovaginal exam following the transabdominal exam to visualize the endometrium and the ovaries. COMPARISON:  None FINDINGS: Uterus Hysterectomy Endometrium Hysterectomy Right ovary The right ovary is not visualized. There is a 8.9 x 5.6 x 4.6 cm heterogeneous and echogenic round structure in the region of the right ovary with no internal vascularity which may represent a large hemorrhagic cyst, and less likely an endometrioma. A solid ovarian mass is not excluded. Short-term follow-up with ultrasound is recommended. MRI may provide additional information if indicated depending on the findings of the follow-up ultrasound. There is blood clot within the pelvis surrounding the right adnexal mass. Small amount of complex/hemorrhagic free fluid is noted within the pelvis. Left ovary Not visualized IMPRESSION: Solid-appearing heterogeneous echogenic mass in the region of the right adnexa with surrounding blood clot and hemorrhagic fluid. This lesion may represent large hemorrhagic cyst. A solid ovarian lesion is not excluded. Close follow-up with ultrasound is recommended. The ovaries are not visualized. Hysterectomy. Electronically Signed   By: Anner Crete M.D.   On: 08/23/2015 01:09   Ct Abdomen Pelvis W Contrast  08/22/2015  CLINICAL DATA:  45 year old female with diffuse right-sided abdominal pain. EXAM: CT ABDOMEN AND PELVIS WITH CONTRAST TECHNIQUE: Multidetector CT imaging of the abdomen and pelvis was performed using the standard protocol following bolus administration of intravenous contrast. CONTRAST:  167mL OMNIPAQUE IOHEXOL 300 MG/ML  SOLN COMPARISON:  None. FINDINGS: The visualized lung bases are clear. No intra-abdominal free air. There is small hemoperitoneum with within the abdomen and pelvis. The liver, gallbladder, pancreas, spleen, adrenal glands, kidneys, visualized  ureters, and urinary bladder appear unremarkable. Hysterectomy. There is a 5.8 x 4.1  cm ovoid high density structure in the right hemipelvis and in the region of the right adnexa suspicious for a hemorrhagic cyst or a hemorrhagic ovarian lesion. Evaluation of this lesion is limited due to surrounding hematoma and small blood clot within the pelvis. Ultrasound is recommended for better evaluation. There is no evidence of bowel obstruction or inflammation. A small tubular structure noted medial to the cecum represents a normal appendix. The abdominal aorta and IVC appear patent. No portal venous gas identified. There is no adenopathy. The abdominal wall soft tissues appear unremarkable. The osseous structures are intact. IMPRESSION: High attenuating right adnexal mass concerning for a hemorrhagic lesion. Ultrasound is recommended for further evaluation. Small hemoperitoneum within the abdomen and pelvis as well as small amount of blood clot within the pelvis. No evidence of bowel obstruction or inflammation. No CT evidence of acute appendicitis. Electronically Signed   By: Anner Crete M.D.   On: 08/22/2015 23:29    Diagnoses that have been ruled out:  None  Diagnoses that are still under consideration:  None  Final diagnoses:  Hemorrhagic cyst of ovary   New Prescriptions   OXYCODONE-ACETAMINOPHEN (PERCOCET/ROXICET) 5-325 MG TABLET    Take 1 tablet by mouth every 4 (four) hours as needed for severe pain.      Plan discharge  Rolland Porter, MD, FACEP    I personally performed the services described in this documentation, which was scribed in my presence. The recorded information has been reviewed and considered.  Rolland Porter, MD, Barbette Or, MD 08/23/15 [phone]

## 2015-08-23 NOTE — Discharge Instructions (Signed)
Abdominal Pain, Adult °Many things can cause abdominal pain. Usually, abdominal pain is not caused by a disease and will improve without treatment. It can often be observed and treated at home. Your health care provider will do a physical exam and possibly order blood tests and X-rays to help determine the seriousness of your pain. However, in many cases, more time must pass before a clear cause of the pain can be found. Before that point, your health care provider may not know if you need more testing or further treatment. °HOME CARE INSTRUCTIONS °Monitor your abdominal pain for any changes. The following actions may help to alleviate any discomfort you are experiencing: °· Only take over-the-counter or prescription medicines as directed by your health care provider. °· Do not take laxatives unless directed to do so by your health care provider. °· Try a clear liquid diet (broth, tea, or water) as directed by your health care provider. Slowly move to a bland diet as tolerated. °SEEK MEDICAL CARE IF: °· You have unexplained abdominal pain. °· You have abdominal pain associated with nausea or diarrhea. °· You have pain when you urinate or have a bowel movement. °· You experience abdominal pain that wakes you in the night. °· You have abdominal pain that is worsened or improved by eating food. °· You have abdominal pain that is worsened with eating fatty foods. °· You have a fever. °SEEK IMMEDIATE MEDICAL CARE IF: °· Your pain does not go away within 2 hours. °· You keep throwing up (vomiting). °· Your pain is felt only in portions of the abdomen, such as the right side or the left lower portion of the abdomen. °· You pass bloody or black tarry stools. °MAKE SURE YOU: °· Understand these instructions. °· Will watch your condition. °· Will get help right away if you are not doing well or get worse. °  °This information is not intended to replace advice given to you by your health care provider. Make sure you discuss  any questions you have with your health care provider. °  °Document Released: 04/06/2005 Document Revised: 03/18/2015 Document Reviewed: 03/06/2013 °Elsevier Interactive Patient Education ©2016 [employer] °Ovarian Cyst °An ovarian cyst is a fluid-filled sac that forms on an ovary. The ovaries are small organs that produce eggs in women. Various types of cysts can form on the ovaries. Most are not cancerous. Many do not cause problems, and they often go away on their own. Some may cause symptoms and require treatment. Common types of ovarian cysts include: °· Functional cysts--These cysts may occur every month during the menstrual cycle. This is normal. The cysts usually go away with the next menstrual cycle if the woman does not get pregnant. Usually, there are no symptoms with a functional cyst. °· Endometrioma cysts--These cysts form from the tissue that lines the uterus. They are also called "chocolate cysts" because they become filled with blood that turns brown. This type of cyst can cause pain in the lower abdomen during intercourse and with your menstrual period. °· Cystadenoma cysts--This type develops from the cells on the outside of the ovary. These cysts can get very big and cause lower abdomen pain and pain with intercourse. This type of cyst can twist on itself, cut off its blood supply, and cause severe pain. It can also easily rupture and cause a lot of pain. °· Dermoid cysts--This type of cyst is sometimes found in both ovaries. These cysts may contain different kinds of body tissue, such as   skin, teeth, hair, or cartilage. They usually do not cause symptoms unless they get very big. °· Theca lutein cysts--These cysts occur when too much of a certain hormone (human chorionic gonadotropin) is produced and overstimulates the ovaries to produce an egg. This is most common after procedures used to assist with the conception of a baby (in vitro fertilization). °CAUSES  °· Fertility drugs can cause a  condition in which multiple large cysts are formed on the ovaries. This is called ovarian hyperstimulation syndrome. °· A condition called polycystic ovary syndrome can cause hormonal imbalances that can lead to nonfunctional ovarian cysts. °SIGNS AND SYMPTOMS  °Many ovarian cysts do not cause symptoms. If symptoms are present, they may include: °· Pelvic pain or pressure. °· Pain in the lower abdomen. °· Pain during sexual intercourse. °· Increasing girth (swelling) of the abdomen. °· Abnormal menstrual periods. °· Increasing pain with menstrual periods. °· Stopping having menstrual periods without being pregnant. °DIAGNOSIS  °These cysts are commonly found during a routine or annual pelvic exam. Tests may be ordered to find out more about the cyst. These tests may include: °· Ultrasound. °· X-ray of the pelvis. °· CT scan. °· MRI. °· Blood tests. °TREATMENT  °Many ovarian cysts go away on their own without treatment. Your health care provider may want to check your cyst regularly for 2-3 months to see if it changes. For women in menopause, it is particularly important to monitor a cyst closely because of the higher rate of ovarian cancer in menopausal women. When treatment is needed, it may include any of the following: °· A procedure to drain the cyst (aspiration). This may be done using a long needle and ultrasound. It can also be done through a laparoscopic procedure. This involves using a thin, lighted tube with a tiny camera on the end (laparoscope) inserted through a small incision. °· Surgery to remove the whole cyst. This may be done using laparoscopic surgery or an open surgery involving a larger incision in the lower abdomen. °· Hormone treatment or birth control pills. These methods are sometimes used to help dissolve a cyst. °HOME CARE INSTRUCTIONS  °· Only take over-the-counter or prescription medicines as directed by your health care provider. °· Follow up with your health care provider as  directed. °· Get regular pelvic exams and Pap tests. °SEEK MEDICAL CARE IF:  °· Your periods are late, irregular, or painful, or they stop. °· Your pelvic pain or abdominal pain does not go away. °· Your abdomen becomes larger or swollen. °· You have pressure on your bladder or trouble emptying your bladder completely. °· You have pain during sexual intercourse. °· You have feelings of fullness, pressure, or discomfort in your stomach. °· You lose weight for no apparent reason. °· You feel generally ill. °· You become constipated. °· You lose your appetite. °· You develop acne. °· You have an increase in body and facial hair. °· You are gaining weight, without changing your exercise and eating habits. °· You think you are pregnant. °SEEK IMMEDIATE MEDICAL CARE IF:  °· You have increasing abdominal pain. °· You feel sick to your stomach (nauseous), and you throw up (vomit). °· You develop a fever that comes on suddenly. °· You have abdominal pain during a bowel movement. °· Your menstrual periods become heavier than usual. °MAKE SURE YOU: °· Understand these instructions. °· Will watch your condition. °· Will get help right away if you are not doing well or get worse. °  °  This information is not intended to replace advice given to you by your health care provider. Make sure you discuss any questions you have with your health care provider. °  °Document Released: 06/27/2005 Document Revised: 07/02/2013 Document Reviewed: 03/04/2013 °Elsevier Interactive Patient Education ©2016 [employer] Inc. ° °

## 2015-08-26 ENCOUNTER — Encounter: Admitting: Physical Therapy

## 2015-08-28 ENCOUNTER — Other Ambulatory Visit: Payer: Self-pay | Admitting: Family Medicine

## 2015-08-28 NOTE — Telephone Encounter (Signed)
Pt has enough until her appt

## 2015-08-28 NOTE — Telephone Encounter (Signed)
She has appt 3/1. Check with pt to see if she has enough to get full years worth of refills at her visit, vs if we need to fill now.  If needed now, no refills, just the 90d

## 2015-08-28 NOTE — Telephone Encounter (Signed)
Is this okay to refill? 

## 2015-09-09 ENCOUNTER — Encounter: Payer: Self-pay | Admitting: Family Medicine

## 2015-09-09 ENCOUNTER — Ambulatory Visit (INDEPENDENT_AMBULATORY_CARE_PROVIDER_SITE_OTHER): Admitting: Family Medicine

## 2015-09-09 VITALS — BP 112/76 | HR 80 | Ht 65.25 in | Wt 157.6 lb

## 2015-09-09 DIAGNOSIS — N83209 Unspecified ovarian cyst, unspecified side: Secondary | ICD-10-CM | POA: Diagnosis not present

## 2015-09-09 DIAGNOSIS — R635 Abnormal weight gain: Secondary | ICD-10-CM

## 2015-09-09 DIAGNOSIS — B001 Herpesviral vesicular dermatitis: Secondary | ICD-10-CM | POA: Diagnosis not present

## 2015-09-09 DIAGNOSIS — R5383 Other fatigue: Secondary | ICD-10-CM | POA: Diagnosis not present

## 2015-09-09 DIAGNOSIS — K589 Irritable bowel syndrome without diarrhea: Secondary | ICD-10-CM | POA: Diagnosis not present

## 2015-09-09 DIAGNOSIS — F3281 Premenstrual dysphoric disorder: Secondary | ICD-10-CM

## 2015-09-09 DIAGNOSIS — E559 Vitamin D deficiency, unspecified: Secondary | ICD-10-CM | POA: Diagnosis not present

## 2015-09-09 DIAGNOSIS — Z0000 Encounter for general adult medical examination without abnormal findings: Secondary | ICD-10-CM | POA: Diagnosis not present

## 2015-09-09 LAB — CBC WITH DIFFERENTIAL/PLATELET
Basophils Absolute: 0 10*3/uL (ref 0.0–0.1)
Basophils Relative: 0 % (ref 0–1)
EOS ABS: 0.1 10*3/uL (ref 0.0–0.7)
EOS PCT: 1 % (ref 0–5)
HCT: 36.1 % (ref 36.0–46.0)
Hemoglobin: 12.1 g/dL (ref 12.0–15.0)
LYMPHS ABS: 2.4 10*3/uL (ref 0.7–4.0)
Lymphocytes Relative: 24 % (ref 12–46)
MCH: 30.1 pg (ref 26.0–34.0)
MCHC: 33.5 g/dL (ref 30.0–36.0)
MCV: 89.8 fL (ref 78.0–100.0)
MONO ABS: 0.8 10*3/uL (ref 0.1–1.0)
MONOS PCT: 8 % (ref 3–12)
MPV: 9.2 fL (ref 8.6–12.4)
NEUTROS PCT: 67 % (ref 43–77)
Neutro Abs: 6.8 10*3/uL (ref 1.7–7.7)
PLATELETS: 484 10*3/uL — AB (ref 150–400)
RBC: 4.02 MIL/uL (ref 3.87–5.11)
RDW: 14.5 % (ref 11.5–15.5)
WBC: 10.1 10*3/uL (ref 4.0–10.5)

## 2015-09-09 LAB — POCT URINALYSIS DIPSTICK
Bilirubin, UA: NEGATIVE
Blood, UA: NEGATIVE
GLUCOSE UA: NEGATIVE
KETONES UA: NEGATIVE
Leukocytes, UA: NEGATIVE
Nitrite, UA: NEGATIVE
Protein, UA: NEGATIVE
UROBILINOGEN UA: NEGATIVE
pH, UA: 6

## 2015-09-09 MED ORDER — LUBIPROSTONE 8 MCG PO CAPS: 16.0000 ug | ORAL_CAPSULE | Freq: Every day | ORAL | Status: DC

## 2015-09-09 MED ORDER — SERTRALINE HCL 100 MG PO TABS: 200.0000 mg | ORAL_TABLET | Freq: Every day | ORAL | Status: DC

## 2015-09-09 NOTE — Patient Instructions (Addendum)
  HEALTH MAINTENANCE RECOMMENDATIONS:  It is recommended that you get at least 30 minutes of aerobic exercise at least 5 days/week (for weight loss, you may need as much as 60-90 minutes). This can be any activity that gets your heart rate up. This can be divided in 10-15 minute intervals if needed, but try and build up your endurance at least once a week.  Weight bearing exercise is also recommended twice weekly.  Eat a healthy diet with lots of vegetables, fruits and fiber.  "Colorful" foods have a lot of vitamins (ie green vegetables, tomatoes, red peppers, etc).  Limit sweet tea, regular sodas and alcoholic beverages, all of which has a lot of calories and sugar.  Up to 1 alcoholic drink daily may be beneficial for women (unless trying to lose weight, watch sugars).  Drink a lot of water.  Calcium recommendations are 1200-1500 mg daily (1500 mg for postmenopausal women or women without ovaries), and vitamin D 1000 IU daily.  This should be obtained from diet and/or supplements (vitamins), and calcium should not be taken all at once, but in divided doses.  Monthly self breast exams and yearly mammograms for women over the age of 63 is recommended.  Sunscreen of at least SPF 30 should be used on all sun-exposed parts of the skin when outside between the hours of 10 am and 4 pm (not just when at beach or pool, but even with exercise, golf, tennis, and yard work!)  Use a sunscreen that says "broad spectrum" so it covers both UVA and UVB rays, and make sure to reapply every 1-2 hours.  Remember to change the batteries in your smoke detectors when changing your clock times in the spring and fall.  Use your seat belt every time you are in a car, and please drive safely and not be distracted with cell phones and texting while driving.  We discussed cutting back on carbs, smaller portions, more fruits, vegetables, high fiber diet.  Limiting juices, sodas, and other non-nutritional but caloric  beverages. Return in 3 months to discuss possible medication change to replace the sertraline if you are not improving (energy, weight).  Meds to consider would be prozac, lexapro, celexa, or switching classes to an SNRI, such as Cymbalta or Pristiq.  Routine eye exam is recommended.  Consider Estroven (black cohosh, soy) to help with night sweats.

## 2015-09-09 NOTE — Progress Notes (Signed)
Chief Complaint  Patient presents with  . Annual Exam    fasting annual exam, no pap sees [place] and has appt end of March with her. Had ovarian cyst that recently burst, has been having abdominal pain and tenderness. Constipation has made that area of tenderness worse. Also complains of tiredness and weight gain over the last year.   . Flu Vaccine    declined.   Lauren Blanchard is a 45 y.o. female who presents for a complete physical.  She has the following concerns:  Fatigue, weight gain.  Gained 11.6# since her last physical. She has reported this in the past as well, and TSH had been normal (last checked a year ago). Symptoms are the same as last year.  Moods are doing well overall, on the 247m of sertraline. She has not been getting any regular exercise.  Wt Readings from Last 3 Encounters:  09/09/15 157 lb 9.6 oz (71.487 kg)  08/10/15 161 lb 3.2 oz (73.12 kg)  09/03/14 146 lb (66.225 kg)   Lab Results  Component Value Date   TSH 0.816 09/03/2014   PMDD--Dose of zoloft was increased to 1524min October 2014, and further increased in 06/2014 to 20072mShe was irritable, having more mood swings, which still seemed cyclical. Irritability has improved a lot, but persists to a degree.Last year (on the same dose) she reported  "I expect to be Lauren Blanchard the time, which is not realistic I guess". She is going to bed earlier, getting more sleep, and this has helped. She doesn't have the energy or time to exercise.  Previously tried Paxil, Prozac.  Can't recall what the problems were. She is "afraid to rock the boat" and make changes to her medication.  ER visit 2/12 with abdominal pain, found on ultrasound to have hemorrhagic cyst. Discharged with percocet.  Percocet makes her constipation worse, so hasn't been taking.  She has mostly been taking tylenol, occasional ibuprofen.  She has appt with GYN, Dr. CalRogue Bussing 3/21, and will have f/u ultrasound.   Pelvic  ultrasound: IMPRESSION: Solid-appearing heterogeneous echogenic mass in the region of the right adnexa with surrounding blood clot and hemorrhagic fluid. This lesion may represent large hemorrhagic cyst. A solid ovarian lesion is not excluded. Close follow-up with ultrasound is recommended. The ovaries are not visualized. Hysterectomy    CT of abdomen and pelvis: IMPRESSION: High attenuating right adnexal mass concerning for a hemorrhagic lesion. Ultrasound is recommended for further evaluation. Small hemoperitoneum within the abdomen and pelvis as well as small amount of blood clot within the pelvis. No evidence of bowel obstruction or inflammation. No CT evidence of acute appendicitis.  She was seen here 1/30 with vertigo, BPV and h/o Meniere's.  We referred for neuro PT, and advised if not improving, needs to go back to ENT for treatment of Meniere's.  She had one session of rehab, and symptoms completely resolved. She caught a little cold after being in the ER, has had some sinus congestion, and very slight, mild dizziness.  IBS with constipation--taking 16 mcg of amitiza at bedtime, along with pericolace. Her bowels are very well controlled on this current regimen.Symptoms (constipation) are always a little worse cyclically (along with breast tenderness).  GERD/h/o epigastric pain: She takes Prevacid (OTC) every day, and denies any recurrent epigastric pain, reflux, or heartburn. She had some mild recurrent symptoms when she missed a pill. Denies dysphagia.  Allergies: Takes flonase and Allegra-D every day.She had a recent mild cold, some  residual congestion.  No fevers  Vit D deficiency: She is compliant with taking daily OTC vitamin.    Immunization History  Administered Date(s) Administered  . Influenza Split 05/24/2012  . Influenza,inj,Quad PF,36+ Mos 05/01/2013  . MMR 06/07/2010, 07/08/2010  . Td 05/24/2010  . Tdap 09/03/2014   Last Pap smear: last year, per pt Last  mammogram: 07/2014 at [place] office Last colonoscopy: 1994 (as part of eval for IBS)  Last DEXA: never  Dentist: every 6 months  Ophtho: never  Exercise: None recently Lipids: Lab Results  Component Value Date   CHOL 234* 09/03/2014   HDL 73 09/03/2014   LDLCALC 130* 09/03/2014   TRIG 156* 09/03/2014   CHOLHDL 3.2 09/03/2014   Past Medical History  Diagnosis Date  . Seasonal allergies   . IBS (irritable bowel syndrome)     constipation predominant  . PONV (postoperative nausea and vomiting)     has had PONV with most of her past surgeries  . PMDD (premenstrual dysphoric disorder)     takes Zoloft for this  . Iron deficiency anemia 2014  . Epigastric pain March,2014    Takes Prevacid (pain resolved)  . Meniere disease 2001    chronic tinnitus; no vertigo since limiting sodium intake  . Hemorrhagic ovarian cyst 08/2015    right    Past Surgical History  Procedure Laterality Date  . Cesarean section      x2: 2001 and 2004  . Nasal sinus surgery      x3  . Mandible surgery  2007  . Wisdom tooth extraction    . Leep  1997  . Tubal ligation  2004  . Robotic assisted total hysterectomy N/A 01/17/2013    Procedure: ROBOTIC ASSISTED TOTAL HYSTERECTOMY;  Surgeon: Allyn Kenner, DO;  Location: [facility];  Service: Gynecology;  Laterality: N/A;  . Bilateral salpingectomy Bilateral 01/17/2013    Procedure: BILATERAL SALPINGECTOMY;  Surgeon: Allyn Kenner, DO;  Location: [facility];  Service: Gynecology;  Laterality: Bilateral;  . Cystoscopy  01/17/2013    Procedure: CYSTOSCOPY;  Surgeon: Allyn Kenner, DO;  Location: [facility];  Service: Gynecology;;    Social History   Social History  . Marital Status: Married    Spouse Name: N/A  . Number of Children: 3  . Years of Education: N/A   Occupational History  . dental assistant    Social History Main Topics  . Smoking status: Never Smoker   . Smokeless tobacco: Never Used  . Alcohol Use: No  . Drug Use: No  . Sexual  Activity:    Partners: Male    Birth Control/ Protection: Surgical   Other Topics Concern  . Not on file   Social History Narrative   Divorced and re-married.  Lives with 3 children, husband.  Pet hedgehog and a dog.  No tobacco exposure.   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   Cut back on caffeine, no longer daily    Family History  Problem Relation Age of Onset  . Diabetes Mother   . Hypertension Mother   . Irritable bowel syndrome Mother   . Alzheimer's disease Father 66  . Hypertension Maternal Grandmother   . Hyperlipidemia Maternal Grandmother   . Stroke Maternal Grandfather   . Diabetes Maternal Grandfather   . Diabetes Paternal Grandmother   . Alzheimer's disease Paternal Grandmother   . Heart disease Paternal Grandfather 81    died of MI  . Turner syndrome  Daughter   . Cancer Neg Hx     Outpatient Encounter Prescriptions as of 09/09/2015  Medication Sig Note  . acetaminophen (TYLENOL) 500 MG tablet Take 1,000 mg by mouth every 6 (six) hours as needed. Reported on 09/09/2015   . Biotin (BIOTIN ULTRA STRENGTH) 10 MG CAPS Take 1 tablet by mouth daily.   . cholecalciferol (VITAMIN D) 1000 UNITS tablet Take 1,000 Units by mouth daily.   . fexofenadine-pseudoephedrine (ALLEGRA-D 24) 180-240 MG per 24 hr tablet Take 1 tablet by mouth daily.   . fluticasone (FLONASE) 50 MCG/ACT nasal spray Place 2 sprays into the nose daily.   . Lactobacillus Rhamnosus, GG, (CULTURELLE PO) Take 1 capsule by mouth daily.   . lansoprazole (PREVACID) 15 MG capsule Take 15 mg by mouth daily.   Marland Kitchen lubiprostone (AMITIZA) 8 MCG capsule Take 2 capsules (16 mcg total) by mouth at bedtime. TAKE ONE CAPSULE BY MOUTH TWICE DAILY WITH A MEAL   . naphazoline-pheniramine (NAPHCON-A) 0.025-0.3 % ophthalmic solution Place 1 drop into both eyes daily.   . sennosides-docusate sodium (SENOKOT-S) 8.6-50 MG tablet Take 1 tablet by mouth daily.   .  sertraline (ZOLOFT) 100 MG tablet Take 2 tablets (200 mg total) by mouth at bedtime.   . vitamin E (VITAMIN E) 1000 UNIT capsule Take 1,000 Units by mouth daily.   . [DISCONTINUED] lubiprostone (AMITIZA) 8 MCG capsule TAKE ONE CAPSULE BY MOUTH TWICE DAILY WITH A MEAL (Patient taking differently: Take 16 mcg by mouth at bedtime. TAKE ONE CAPSULE BY MOUTH TWICE DAILY WITH A MEAL)   . [DISCONTINUED] oxyCODONE-acetaminophen (PERCOCET/ROXICET) 5-325 MG tablet Take 1 tablet by mouth every 4 (four) hours as needed for severe pain.   . [DISCONTINUED] sertraline (ZOLOFT) 100 MG tablet Take 2 tablets (200 mg total) by mouth at bedtime.   Marland Kitchen gentamicin (GARAMYCIN) 40 MG/ML injection Place 40 mg into the nose as needed. Reported on 09/09/2015 09/09/2015: Uses via irrigation prn purulent nasal drainage  . ibuprofen (ADVIL,MOTRIN) 200 MG tablet Take 400-600 mg by mouth every 6 (six) hours as needed for pain. Reported on 09/09/2015   . valACYclovir (VALTREX) 1000 MG tablet Take 2 tablets at onset of cold sore.  Repeat once in 12 hours (4 tablets per sore) (Patient not taking: Reported on 08/22/2015)    No facility-administered encounter medications on file as of 09/09/2015.    Allergies  Allergen Reactions  . Sulfa Antibiotics Rash   ROS: The patient denies anorexia, fever, headaches (just occasional sinus headaches), vision changes, decreased hearing, ear pain, sore throat, breast concerns, chest pain, palpitations, dizziness, syncope, dyspnea on exertion, cough, swelling, vomiting, diarrhea, melena, hematochezia, hematuria, dysuria, vaginal discharge, odor or itch, genital lesions, joint pains, numbness, tingling, weakness, tremor, suspicious skin lesions, abnormal bleeding/bruising, or enlarged lymph nodes.  Mild urinary urgency, no incontinence. Some recent suprapubic discomfort with voiding, since the ovarian cyst, improving. + constipation, chronic, controlled +weight gain (11.6# since her last physical) Chronic  tinnitus, some trouble hearing low pitches Vertigo--minimal now (related to a recent cold), improved with PT Abdominal pain (RLQ) per HPI. Worse when she goes to the bathroom, not a constant pain. Mild, infrequent hot flashes; Sweats at night, notices in the morning, doesn't awaken her.   PHYSICAL EXAM:  BP 142/90 mmHg  Pulse 80  Ht 5' 5.25" (1.657 m)  Wt 157 lb 9.6 oz (71.487 kg)  BMI 26.04 kg/m2  LMP 01/10/2013 112/76 on repeat by MD  General Appearance:  Alert, cooperative, no distress, appears stated  age   Head:  Normocephalic, without obvious abnormality, atraumatic   Eyes:  PERRL, conjunctiva/corneas clear, EOM's intact, fundi  benign   Ears:  Normal TM's and external ear canals   Nose:  Nares normal, mucosa is mildly edematous, no erythema or purulence. No sinus tenderness   Throat:  Lips, mucosa, and tongue normal; teeth and gums normal   Neck:  Supple, no lymphadenopathy; thyroid: no enlargement/tenderness/nodules; no carotid  bruit or JVD   Back:  Spine nontender, no curvature, ROM normal, no CVA tenderness   Lungs:  Clear to auscultation bilaterally without wheezes, rales or ronchi; respirations unlabored   Chest Wall:  No tenderness or deformity   Heart:  Regular rate and rhythm, S1 and S2 normal, no murmur, rub  or gallop   Breast Exam:  Deferred to GYN   Abdomen:  Soft, nondistended, normoactive bowel sounds,  no masses, no hepatosplenomegaly.Mildly diffusely tender across lower abdomen. No rebound tenderness or guarding  Genitalia:  Deferred to GYN      Extremities:  No clubbing, cyanosis or edema   Pulses:  2+ and symmetric all extremities   Skin:  Skin color, texture, turgor normal, no rashes; flesh colored papule at her lower left leg, medially (present x years, per pt)  Lymph nodes:  Cervical, supraclavicular, and axillary nodes normal   Neurologic:  CNII-XII intact, normal  strength, sensation and gait; reflexes 2+ and symmetric throughout   Psych: Normal mood, affect, hygiene and grooming             Urine dip today normal, concentrated (SG>= 1.030)  Recent labs in ER:  Lab Results  Component Value Date   WBC 14.6* 08/22/2015   HGB 12.0 08/22/2015   HCT 35.0* 08/22/2015   MCV 86.8 08/22/2015   PLT 360 08/22/2015     Chemistry      Component Value Date/Time   NA 137 08/22/2015 2129   K 3.3* 08/22/2015 2129   CL 103 08/22/2015 2129   CO2 22 08/22/2015 2129   BUN 16 08/22/2015 2129   CREATININE 0.67 08/22/2015 2129   CREATININE 0.69 09/03/2014 1012      Component Value Date/Time   CALCIUM 9.6 08/22/2015 2129   ALKPHOS 58 08/22/2015 2129   AST 26 08/22/2015 2129   ALT 21 08/22/2015 2129   BILITOT 0.5 08/22/2015 2129     Normal lactic acid, troponin, lipase   ASSESSMENT/PLAN:  Annual physical exam - Plan: POCT Urinalysis Dipstick, Visual acuity screening, Lipid panel, Glucose, random, TSH, VITAMIN D 25 Hydroxy (Vit-D Deficiency, Fractures), CBC with Differential/Platelet  PMDD (premenstrual dysphoric disorder) - controlled with 268m sertraline, but possibly having side effects (wt gain, fatigue); discussed potential alternative treatments - Plan: sertraline (ZOLOFT) 100 MG tablet  IBS (irritable bowel syndrome) - controlled (constipation predominant) - Plan: lubiprostone (AMITIZA) 8 MCG capsule  Vitamin D deficiency - Plan: VITAMIN D 25 Hydroxy (Vit-D Deficiency, Fractures)  Herpes labialis - continue prn valtrex  Other fatigue - Plan: TSH, VITAMIN D 25 Hydroxy (Vit-D Deficiency, Fractures), CBC with Differential/Platelet  Weight gain - counseled re: diet, portions, exercise, weight loss  Hemorrhagic ovarian cyst - right; clinically improving. f/u as scheduled with GYN   Fasting glucose, CBC (to f/u high WBC), lipids, TSH Vit D  Fatigue and weight gain Suspect that sertraline is  contributing.  Counseled extensively re: diet, exercise, portion control, healthy snacks Declines changing meds at this time. Told to consider prozac, lexapro, celexa, or switch to SNRI such as pristiq or  Cymbalta. Trial diet/exercise 3 months  Discussed monthly self breast exams and yearly mammograms; at least 30 minutes of aerobic activity at least 5 days/week, weight-bearing exercise 2-3x/week; proper sunscreen use reviewed; healthy diet, including goals of calcium and vitamin D intake and alcohol recommendations (less than or equal to 1 drink/day) reviewed; regular seatbelt use; changing batteries in smoke detectors. Immunization recommendations discussed--Td was confirmed to be given in 2011, not Tdap.TdaP given today. Declines flu shot today. Yearly flu shots are recommended. Colonoscopy recommendations reviewed--age 39.   Routine eye exam recommended Declines flu shot  F/u to discuss med changes if needed  Over 50-55 min visit, more than 1/2 spent counseling

## 2015-09-10 ENCOUNTER — Telehealth: Payer: Self-pay | Admitting: Family Medicine

## 2015-09-10 LAB — LIPID PANEL
CHOL/HDL RATIO: 3.5 ratio (ref <=5.0)
Cholesterol: 252 mg/dL — ABNORMAL HIGH (ref 125–200)
HDL: 71 mg/dL (ref >=46)
LDL CALC: 155 mg/dL — AB (ref <130)
Triglycerides: 132 mg/dL (ref <150)
VLDL: 26 mg/dL (ref <30)

## 2015-09-10 LAB — TSH: TSH: 0.67 m[IU]/L

## 2015-09-10 LAB — GLUCOSE, RANDOM: GLUCOSE: 85 mg/dL (ref 65–99)

## 2015-09-10 LAB — VITAMIN D 25 HYDROXY (VIT D DEFICIENCY, FRACTURES): VIT D 25 HYDROXY: 52 ng/mL (ref 30–100)

## 2015-09-10 NOTE — Telephone Encounter (Signed)
Directions should state 1 twice daily (that is what it has always said)--she is actually taking it 2 in the evening.  I guess put it back to stating 1 twice daily, as that is the usual directions

## 2015-09-10 NOTE — Telephone Encounter (Signed)
Rcvd rx clarification  Request  Stating that there are two sets of directions on Amitiza 73mcg #180 & to please provide the intended directions. Form placed in Dr Johnsie Kindred folder

## 2015-09-10 NOTE — Telephone Encounter (Signed)
Form filled out and faxed back

## 2015-12-30 ENCOUNTER — Ambulatory Visit: Admitting: Family Medicine

## 2016-01-25 ENCOUNTER — Telehealth: Payer: Self-pay | Admitting: Family Medicine

## 2016-01-25 MED ORDER — SCOPOLAMINE 1 MG/3DAYS TD PT72: 1.0000 | MEDICATED_PATCH | TRANSDERMAL | Status: DC

## 2016-01-25 NOTE — Telephone Encounter (Signed)
Called pt and gave her Dr Johnsie Kindred instructions

## 2016-01-25 NOTE — Telephone Encounter (Signed)
Advise pt done.  I sent for 4 patches--not sure how many days she will be at parks, driving--change patch every 3 days.  This will likely leave her with extras for use in the future, if needed.  Put patch on at least an hour prior to getting in the car.  I have been getting denials on this from insurance, stating to use meclizine (OTC tablet) instead, vs paying for this out of pocket--just a heads up, in case insurance doesn't cover it, wanted to warn her of the possibility and options (pay for it herself, or try meclizine 12.5-25mg  every 8 hours prn).

## 2016-01-25 NOTE — Telephone Encounter (Signed)
Requesting motion sickness patches that go behind the ear for her trip that is scheduled for this Saturday by car to [place]. Pt has used these in the past & she has a history of getting car sick. She is concerned about getting car sick on this trip and about getting sick on amusement park rides with the kids. Pt would like for med to be sent to local pharmacy at [facility] in [place]

## 2016-05-27 ENCOUNTER — Other Ambulatory Visit: Payer: Self-pay | Admitting: Otolaryngology

## 2016-05-27 ENCOUNTER — Ambulatory Visit
Admission: RE | Admit: 2016-05-27 | Discharge: 2016-05-27 | Disposition: A | Discharge status: Home / Self Care | Source: Ambulatory Visit | Attending: Otolaryngology | Admitting: Otolaryngology

## 2016-05-27 DIAGNOSIS — J321 Chronic frontal sinusitis: Secondary | ICD-10-CM

## 2016-05-27 DIAGNOSIS — J3489 Other specified disorders of nose and nasal sinuses: Secondary | ICD-10-CM

## 2016-06-22 ENCOUNTER — Encounter: Payer: Self-pay | Admitting: Rehabilitative and Restorative Service Providers"

## 2016-06-22 NOTE — Therapy (Signed)
[facility] [address] [place], [place], [address] Phone: [phone]   Fax:  [phone]  Patient Details  Name: Lauren Blanchard MRN: [id number] Date of Birth: [birth year] Referring Provider:  No ref. provider found  Encounter Date: last encounter 08/14/2015  PHYSICAL THERAPY DISCHARGE SUMMARY  Visits from Start of Care: eval only  See initial summary for patient status--she did not return for follow-up visits.   Thank you for the referral of this patient. Rudell Cobb, MPT   [name] 06/22/2016, 9:45 AM  [facility] [address] [place], [place], [address] Phone: [phone]   Fax:  [phone]

## 2016-09-05 ENCOUNTER — Other Ambulatory Visit: Payer: Self-pay | Admitting: Family Medicine

## 2016-09-05 DIAGNOSIS — F3281 Premenstrual dysphoric disorder: Secondary | ICD-10-CM

## 2016-09-05 DIAGNOSIS — K589 Irritable bowel syndrome without diarrhea: Secondary | ICD-10-CM

## 2016-09-10 NOTE — Progress Notes (Signed)
Chief Complaint  Patient presents with  . Annual Exam    fasting annual exam, no pap-sees Dr Allyn Kenner and is UTD. Lately has had some diffiiculty with swallowing pills and/or foods-can get choked up.     [place] is a 46 y.o. female who presents for a complete physical. She sees GYN for well woman exams. They are also following her R ovarian cyst, last seen in December; cyst has been decreasing in size.  She was given topical estrogen trial for atrophic vaginitis. Admits to not using it often, but it does help.  She has complained of fatigue, weight gain the last few visits, and reports the same today. TSH has been normal yearly.  She reports that she lost 15# over the summer with diet and exercise, but she regained them.  She followed a low carb diet. She was power-walking with arm and leg weights. She regained the weight when re-introduced carbs, and is no longer getting regular exercise.  She does report that her energy was better when getting daily exercise.  PMDD--Dose of zoloft was increased to 141m in October 2014, and further increased to her current 2037mdose in 06/2014. She is doing well on this medication, at this dose; reports having just mild mood swings and irritability which are cyclical.   (Previously tried Paxil, Prozac.  Can't recall what the problems were. Previously reported being  "afraid to rock the boat" and make changes to her medication.)  H/o BPV and h/o Meniere's.  Last year she went to neuro PT with a BPV flare and symptoms resolved after one session of rehab She has since seen Dr. RoConstance Holsteror sinus infection.  Doing well now. Starting to have some increased drainage related to allergies. She takes flonase and Allegra-D every day.  IBS with constipation--taking 16 mcg of amitiza at bedtime, along with pericolace. Her bowels are very well controlled on this current regimen.Symptoms (constipation) are always a little worse cyclically (along with breast  tenderness).  GERD/h/o epigastric pain: She takes Prevacid (OTC) every day, and denies any recurrent epigastric pain, reflux, or heartburn. She had some mild recurrent symptoms when she missed a pill.  She recently has started having problems swallowing larger capsules (a supplement she takes).  Doesn't seem to have a problem with her smaller meds. Only very rarely with food (once).  She is having to bve more conscious about her eating, eating slower, chewing longer/smaller bites.  Vit D deficiency: She is compliant with taking daily OTC vitamin. Vitamin D-OH level was normal at 52 last year.  Hyperlipidemia:  Borderline high LDL, but good HDL. LDL was higher last year, jumping from 130 prior years, to 155 last year. Doesn't eat red meat or eggs.  Some cheese, not daily. +creamy soups. Lab Results  Component Value Date   CHOL 252 (H) 09/09/2015   HDL 71 09/09/2015   LDLCALC 155 (H) 09/09/2015   TRIG 132 09/09/2015   CHOLHDL 3.5 09/09/2015     Immunization History  Administered Date(s) Administered  . Influenza Split 05/24/2012  . Influenza,inj,Quad PF,36+ Mos 05/01/2013  . MMR 06/07/2010, 07/08/2010  . Td 05/24/2010  . Tdap 09/03/2014   Last Pap smear: last year, per pt Last mammogram: 09/2015 at GYN's office Last colonoscopy: 1994 (as part of eval for IBS)  Last DEXA: never  Dentist: every 6 months  Ophtho: a few months ago  Exercise: playing with puppy.  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  Past Medical History:  Diagnosis Date  . Epigastric pain March,2014   Takes Prevacid (pain resolved)  . Hemorrhagic ovarian cyst 08/2015   right  . IBS (irritable bowel syndrome)    constipation predominant  . Iron deficiency anemia 2014  . Meniere disease 2001   chronic tinnitus; no vertigo since limiting sodium intake  . PMDD (premenstrual dysphoric disorder)    takes Zoloft for this  . PONV (postoperative nausea and  vomiting)    has had PONV with most of her past surgeries  . Seasonal allergies     Past Surgical History:  Procedure Laterality Date  . BILATERAL SALPINGECTOMY Bilateral 01/17/2013   Procedure: BILATERAL SALPINGECTOMY;  Surgeon: Allyn Kenner, DO;  Location: [facility];  Service: Gynecology;  Laterality: Bilateral;  . CESAREAN SECTION     x2: 2001 and 2004  . CYSTOSCOPY  01/17/2013   Procedure: CYSTOSCOPY;  Surgeon: Allyn Kenner, DO;  Location: [facility];  Service: Gynecology;;  . LEEP  1997  . MANDIBLE SURGERY  2007  . NASAL SINUS SURGERY     x3  . ROBOTIC ASSISTED TOTAL HYSTERECTOMY N/A 01/17/2013   Procedure: ROBOTIC ASSISTED TOTAL HYSTERECTOMY;  Surgeon: Allyn Kenner, DO;  Location: [facility];  Service: Gynecology;  Laterality: N/A;  . TUBAL LIGATION  2004  . WISDOM TOOTH EXTRACTION      Social History   Social History  . Marital status: Married    Spouse name: N/A  . Number of children: 3  . Years of education: N/A   Occupational History  . dental assistant [employer]   Social History Main Topics  . Smoking status: Never Smoker  . Smokeless tobacco: Never Used  . Alcohol use No  . Drug use: No  . Sexual activity: Yes    Partners: Male    Birth control/ protection: Surgical   Other Topics Concern  . Not on file   Social History Narrative   Divorced and re-married.  Lives with 3 children, husband.  Pet hedgehog and a Malti-poo puppy (2018), turtle.  No tobacco exposure.   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       Family History  Problem Relation Age of Onset  . Diabetes Mother   . Hypertension Mother   . Irritable bowel syndrome Mother   . Alzheimer's disease Father 36  . Hypertension Maternal Grandmother   . Hyperlipidemia Maternal Grandmother   . Stroke Maternal Grandfather   . Diabetes Maternal Grandfather   . Diabetes Paternal Grandmother   . Alzheimer's disease Paternal Grandmother    . Heart disease Paternal Grandfather 13    died of MI  . Turner syndrome Daughter   . Cancer Neg Hx     Outpatient Encounter Prescriptions as of 09/12/2016  Medication Sig Note  . Biotin (BIOTIN ULTRA STRENGTH) 10 MG CAPS Take 1 tablet by mouth daily.   . cholecalciferol (VITAMIN D) 1000 UNITS tablet Take 1,000 Units by mouth daily.   . fexofenadine-pseudoephedrine (ALLEGRA-D 24) 180-240 MG per 24 hr tablet Take 1 tablet by mouth daily.   . fluticasone (FLONASE) 50 MCG/ACT nasal spray Place 2 sprays into the nose daily.   Marland Kitchen gentamicin (GARAMYCIN) 40 MG/ML injection Place 40 mg into the nose as needed. Reported on 09/09/2015 09/09/2015: Uses via irrigation prn purulent nasal drainage  . lansoprazole (PREVACID) 15 MG capsule Take 15 mg by mouth daily.   Marland Kitchen lubiprostone (AMITIZA) 8 MCG capsule Take  2 capsules (16 mcg total) by mouth at bedtime. TAKE ONE CAPSULE BY MOUTH TWICE DAILY WITH A MEAL   . naphazoline-pheniramine (NAPHCON-A) 0.025-0.3 % ophthalmic solution Place 1 drop into both eyes daily.   . sennosides-docusate sodium (SENOKOT-S) 8.6-50 MG tablet Take 1 tablet by mouth daily.   . sertraline (ZOLOFT) 100 MG tablet Take 2 tablets (200 mg total) by mouth at bedtime.   . vitamin E (VITAMIN E) 1000 UNIT capsule Take 1,000 Units by mouth daily.   Marland Kitchen acetaminophen (TYLENOL) 500 MG tablet Take 1,000 mg by mouth every 6 (six) hours as needed. Reported on 09/09/2015   . ibuprofen (ADVIL,MOTRIN) 200 MG tablet Take 400-600 mg by mouth every 6 (six) hours as needed for pain. Reported on 09/09/2015   . Lactobacillus Rhamnosus, GG, (CULTURELLE PO) Take 1 capsule by mouth daily.   . valACYclovir (VALTREX) 1000 MG tablet Take 2 tablets at onset of cold sore.  Repeat once in 12 hours (4 tablets per sore) (Patient not taking: Reported on 08/22/2015)   . [DISCONTINUED] scopolamine (TRANSDERM-SCOP, 1.5 MG,) 1 MG/3DAYS Place 1 patch (1.5 mg total) onto the skin every 3 (three) days. (Patient not taking: Reported on  09/12/2016)    No facility-administered encounter medications on file as of 09/12/2016.    Also takes a feminine supplement (that is the large one that is hard to swallow) for menopausal symptoms. Manca root, vit C and vit E  Allergies  Allergen Reactions  . Sulfa Antibiotics Rash    ROS: The patient denies anorexia, fever, headaches (just occasional sinus headaches), decreased hearing, ear pain, sore throat, breast concerns, chest pain, palpitations, dizziness, syncope, dyspnea on exertion, cough, swelling, vomiting, diarrhea, melena, hematochezia, hematuria, dysuria, vaginal discharge, odor or itch, genital lesions, numbness, tingling, weakness, tremor, suspicious skin lesions, abnormal bleeding/bruising, or enlarged lymph nodes.  Occasional nausea.  Some postnasal drainage.  Some change in vision--needs reading glasses now Mild urinary urgency, no incontinence, but it is getting close. + constipation, chronic, controlled Chronic tinnitus, some trouble hearing low pitches Hot flashes/night sweats improved with supplements. Some intermittent pain in her knees. Down 1.6# from physical last year (but reports 15# loss, with re-gain of most)   PHYSICAL EXAM:  BP 128/76 (BP Location: Left Arm, Patient Position: Sitting, Cuff Size: Normal)   Pulse 76   Ht 5' 5"  (1.651 m)   Wt 156 lb (70.8 kg)   LMP 01/10/2013   BMI 25.96 kg/m    General Appearance:  Alert, cooperative, no distress, appears stated age   Head:  Normocephalic, without obvious abnormality, atraumatic   Eyes:  PERRL, conjunctiva/corneas clear, EOM's intact, fundi benign   Ears:  Normal TM's and external ear canals   Nose:  Nares normal, mucosa is mildly edematous, with some crusting/dried blood in right nares, no erythema or purulence. No sinus tenderness   Throat:  Lips, mucosa, and tongue normal; teeth and gums normal   Neck:  Supple, no lymphadenopathy; thyroid: no  enlargement/tenderness/nodules; no carotid bruit or JVD   Back:  Spine nontender, no curvature, ROM normal, no CVA tenderness   Lungs:  Clear to auscultation bilaterally without wheezes, rales or ronchi; respirations unlabored   Chest Wall:  No tenderness or deformity   Heart:  Regular rate and rhythm, S1 and S2 normal, no murmur, rub or gallop   Breast Exam:  Deferred to GYN   Abdomen:  Soft, nontender, nondistended, normoactive bowel sounds, no masses, no hepatosplenomegaly.  Genitalia:  Deferred to GYN  Extremities:  No clubbing, cyanosis or edema   Pulses:  2+ and symmetric all extremities   Skin:  Skin color, texture, turgor normal, no rashes; flesh colored papule at her lower left leg, medially (present x years, per pt)  Lymph nodes:  Cervical, supraclavicular, and axillary nodes normal   Neurologic:  CNII-XII intact, normal strength, sensation and gait; reflexes 2+ and symmetric throughout   Psych: Normal mood, affect, hygiene and grooming   ASSESSMENT/PLAN:  Annual physical exam - Plan: POCT Urinalysis Dipstick, TSH, CBC with Differential/Platelet, Comprehensive metabolic panel, Lipid panel  PMDD (premenstrual dysphoric disorder) - controlled on current dose of sertraline; continue - Plan: sertraline (ZOLOFT) 100 MG tablet  Irritable bowel syndrome with constipation - controlled with current regimen, including amitiza - Plan: lubiprostone (AMITIZA) 8 MCG capsule  Vitamin D deficiency - continue daily supplement  PMDD (premenstrual dysphoric disorder) - controlled with 257m sertraline, but possibly having side effects (wt gain, fatigue); discussed potential alternative treatments - Plan: sertraline (ZOLOFT) 100 MG tablet  Hypercholesteremia - recheck today; low cholesterol diet reviewed - Plan: Lipid panel  Medication monitoring encounter - Plan: CBC with Differential/Platelet, Comprehensive metabolic  panel  Oropharyngeal dysphagia - mild, intermittent, mostly related to large pills; Ddx reviewed; consider further w/u if persists/worsens   CBC (plt elevated last year), c-met, lipid, TSH   Discussed monthly self breast exams and yearly mammograms; at least 30 minutes of aerobic activity at least 5 days/week, weight-bearing exercise 2-3x/week; proper sunscreen use reviewed; healthy diet, including goals of calcium and vitamin D intake and alcohol recommendations (less than or equal to 1 drink/day) reviewed; regular seatbelt use; changing batteries in smoke detectors. Immunization recommendations discussed-- Yearly flu shots are recommended (declined). Colonoscopy recommendations reviewed--age 69.   Consider GI referral for EGD vs barium swallow if dysphagia persists. Counseled. Can also discuss with Dr. RConstance Holster Consider doubling up on PPI for a couple of weekx

## 2016-09-12 ENCOUNTER — Encounter: Payer: Self-pay | Admitting: Family Medicine

## 2016-09-12 ENCOUNTER — Ambulatory Visit (INDEPENDENT_AMBULATORY_CARE_PROVIDER_SITE_OTHER): Admitting: Family Medicine

## 2016-09-12 VITALS — BP 128/76 | HR 76 | Ht 65.0 in | Wt 156.0 lb

## 2016-09-12 DIAGNOSIS — R1312 Dysphagia, oropharyngeal phase: Secondary | ICD-10-CM

## 2016-09-12 DIAGNOSIS — Z0000 Encounter for general adult medical examination without abnormal findings: Secondary | ICD-10-CM

## 2016-09-12 DIAGNOSIS — E7800 Pure hypercholesterolemia, unspecified: Secondary | ICD-10-CM

## 2016-09-12 DIAGNOSIS — E559 Vitamin D deficiency, unspecified: Secondary | ICD-10-CM | POA: Diagnosis not present

## 2016-09-12 DIAGNOSIS — Z5181 Encounter for therapeutic drug level monitoring: Secondary | ICD-10-CM | POA: Diagnosis not present

## 2016-09-12 DIAGNOSIS — K581 Irritable bowel syndrome with constipation: Secondary | ICD-10-CM

## 2016-09-12 DIAGNOSIS — F3281 Premenstrual dysphoric disorder: Secondary | ICD-10-CM | POA: Diagnosis not present

## 2016-09-12 LAB — COMPREHENSIVE METABOLIC PANEL
ALBUMIN: 4.4 g/dL (ref 3.6–5.1)
ALK PHOS: 65 U/L (ref 33–115)
ALT: 158 U/L — AB (ref 6–29)
AST: 86 U/L — ABNORMAL HIGH (ref 10–35)
BUN: 17 mg/dL (ref 7–25)
CALCIUM: 9.6 mg/dL (ref 8.6–10.2)
CO2: 27 mmol/L (ref 20–31)
Chloride: 106 mmol/L (ref 98–110)
Creat: 0.61 mg/dL (ref 0.50–1.10)
Glucose, Bld: 92 mg/dL (ref 65–99)
POTASSIUM: 4.5 mmol/L (ref 3.5–5.3)
Sodium: 141 mmol/L (ref 135–146)
TOTAL PROTEIN: 6.9 g/dL (ref 6.1–8.1)
Total Bilirubin: 0.3 mg/dL (ref 0.2–1.2)

## 2016-09-12 LAB — CBC WITH DIFFERENTIAL/PLATELET
BASOS ABS: 61 {cells}/uL (ref 0–200)
BASOS PCT: 1 %
EOS ABS: 122 {cells}/uL (ref 15–500)
Eosinophils Relative: 2 %
HEMATOCRIT: 39.5 % (ref 35.0–45.0)
HEMOGLOBIN: 13.1 g/dL (ref 11.7–15.5)
Lymphocytes Relative: 36 %
Lymphs Abs: 2196 cells/uL (ref 850–3900)
MCH: 30 pg (ref 27.0–33.0)
MCHC: 33.2 g/dL (ref 32.0–36.0)
MCV: 90.6 fL (ref 80.0–100.0)
MONO ABS: 793 {cells}/uL (ref 200–950)
MPV: 9.9 fL (ref 7.5–12.5)
Monocytes Relative: 13 %
NEUTROS ABS: 2928 {cells}/uL (ref 1500–7800)
Neutrophils Relative %: 48 %
Platelets: 308 10*3/uL (ref 140–400)
RBC: 4.36 MIL/uL (ref 3.80–5.10)
RDW: 13.6 % (ref 11.0–15.0)
WBC: 6.1 10*3/uL (ref 4.0–10.5)

## 2016-09-12 LAB — POCT URINALYSIS DIPSTICK
BILIRUBIN UA: NEGATIVE
Blood, UA: NEGATIVE
Glucose, UA: NEGATIVE
KETONES UA: NEGATIVE
Nitrite, UA: NEGATIVE
Protein, UA: NEGATIVE
Urobilinogen, UA: NEGATIVE
pH, UA: 6

## 2016-09-12 LAB — LIPID PANEL
CHOL/HDL RATIO: 3.7 ratio (ref <5.0)
CHOLESTEROL: 242 mg/dL — AB (ref <200)
HDL: 65 mg/dL (ref >50)
LDL Cholesterol: 158 mg/dL — ABNORMAL HIGH (ref <100)
TRIGLYCERIDES: 94 mg/dL (ref <150)
VLDL: 19 mg/dL (ref <30)

## 2016-09-12 LAB — TSH: TSH: 0.8 mIU/L

## 2016-09-12 MED ORDER — SERTRALINE HCL 100 MG PO TABS: 200.0000 mg | ORAL_TABLET | Freq: Every day | ORAL | 3 refills | Status: DC

## 2016-09-12 MED ORDER — LUBIPROSTONE 8 MCG PO CAPS: 16.0000 ug | ORAL_CAPSULE | Freq: Every day | ORAL | 3 refills | Status: DC

## 2016-09-12 NOTE — Patient Instructions (Signed)

## 2016-09-14 ENCOUNTER — Other Ambulatory Visit: Payer: Self-pay | Admitting: *Deleted

## 2016-09-14 DIAGNOSIS — R748 Abnormal levels of other serum enzymes: Secondary | ICD-10-CM

## 2016-10-17 ENCOUNTER — Other Ambulatory Visit

## 2016-10-17 DIAGNOSIS — R748 Abnormal levels of other serum enzymes: Secondary | ICD-10-CM

## 2016-10-17 LAB — HEPATIC FUNCTION PANEL
ALT: 20 U/L (ref 6–29)
AST: 18 U/L (ref 10–35)
Albumin: 4.2 g/dL (ref 3.6–5.1)
Alkaline Phosphatase: 61 U/L (ref 33–115)
BILIRUBIN DIRECT: 0 mg/dL (ref <=0.2)
BILIRUBIN INDIRECT: 0.2 mg/dL (ref 0.2–1.2)
BILIRUBIN TOTAL: 0.2 mg/dL (ref 0.2–1.2)
Total Protein: 6.8 g/dL (ref 6.1–8.1)

## 2016-10-21 ENCOUNTER — Other Ambulatory Visit: Payer: Self-pay | Admitting: Obstetrics and Gynecology

## 2016-10-24 LAB — CYTOLOGY - PAP

## 2016-10-26 ENCOUNTER — Other Ambulatory Visit: Payer: Self-pay | Admitting: Obstetrics and Gynecology

## 2016-10-26 DIAGNOSIS — R928 Other abnormal and inconclusive findings on diagnostic imaging of breast: Secondary | ICD-10-CM

## 2016-10-28 ENCOUNTER — Ambulatory Visit
Admission: RE | Admit: 2016-10-28 | Discharge: 2016-10-28 | Disposition: A | Discharge status: Home / Self Care | Source: Ambulatory Visit | Attending: Obstetrics and Gynecology | Admitting: Obstetrics and Gynecology

## 2016-10-28 ENCOUNTER — Other Ambulatory Visit: Payer: Self-pay | Admitting: Obstetrics and Gynecology

## 2016-10-28 DIAGNOSIS — R928 Other abnormal and inconclusive findings on diagnostic imaging of breast: Secondary | ICD-10-CM

## 2016-10-28 DIAGNOSIS — N6310 Unspecified lump in the right breast, unspecified quadrant: Secondary | ICD-10-CM

## 2016-10-31 ENCOUNTER — Telehealth: Payer: Self-pay | Admitting: Family Medicine

## 2016-10-31 MED ORDER — ALPRAZOLAM 0.25 MG PO TABS: 0.2500 mg | ORAL_TABLET | Freq: Three times a day (TID) | ORAL | 0 refills | Status: DC | PRN

## 2016-10-31 NOTE — Telephone Encounter (Signed)
Pt called and stated that she is having a biopsy this week and the closer it gets the more her nerves are getting the best of her. She is requesting be sent in to help her on the day of procedure. Pt uses [facility] in [place] and would like a call to discuss timing if approved. Please call pt at [phone].

## 2016-10-31 NOTE — Telephone Encounter (Signed)
Looks like she is scheduled for a breast biopsy on 4/25.  See if she has ever taken any kind of medication for nerves before. She would need to have someone else drive her to/from visit, as the main side effect would be sedation.  I would recommend alprazolam 0.25mg .  Directions 1-2 every 8 hours prn anxiety.  I'd start with just 1--if extremely anxious, may take a second, but that will be more sedating.  Likely just one should take the edge off and be helpful. Okay for #6--enough to use a few times beforehand, if needed (to test it out, or for anxiety until the procedure).

## 2016-10-31 NOTE — Telephone Encounter (Signed)
Patient has taken alprazolam a long time ago and she did fine with it, called in #6 to [facility] and went over Dr.Knapp's recommendations with her.

## 2016-11-01 ENCOUNTER — Other Ambulatory Visit: Payer: Self-pay | Admitting: Obstetrics and Gynecology

## 2016-11-01 DIAGNOSIS — N6310 Unspecified lump in the right breast, unspecified quadrant: Secondary | ICD-10-CM

## 2016-11-02 ENCOUNTER — Ambulatory Visit
Admission: RE | Admit: 2016-11-02 | Discharge: 2016-11-02 | Disposition: A | Discharge status: Home / Self Care | Source: Ambulatory Visit | Attending: Obstetrics and Gynecology | Admitting: Obstetrics and Gynecology

## 2016-11-02 ENCOUNTER — Encounter: Payer: Self-pay | Admitting: Family Medicine

## 2016-11-02 DIAGNOSIS — N6310 Unspecified lump in the right breast, unspecified quadrant: Secondary | ICD-10-CM

## 2016-11-24 ENCOUNTER — Other Ambulatory Visit: Payer: Self-pay | Admitting: General Surgery

## 2016-11-24 ENCOUNTER — Encounter: Payer: Self-pay | Admitting: General Surgery

## 2016-11-24 DIAGNOSIS — R928 Other abnormal and inconclusive findings on diagnostic imaging of breast: Secondary | ICD-10-CM

## 2016-11-30 ENCOUNTER — Other Ambulatory Visit: Payer: Self-pay | Admitting: General Surgery

## 2016-11-30 DIAGNOSIS — R928 Other abnormal and inconclusive findings on diagnostic imaging of breast: Secondary | ICD-10-CM

## 2016-12-29 ENCOUNTER — Encounter (HOSPITAL_BASED_OUTPATIENT_CLINIC_OR_DEPARTMENT_OTHER): Payer: Self-pay | Admitting: *Deleted

## 2017-01-04 ENCOUNTER — Ambulatory Visit
Admission: RE | Admit: 2017-01-04 | Discharge: 2017-01-04 | Disposition: A | Discharge status: Home / Self Care | Source: Ambulatory Visit | Attending: General Surgery | Admitting: General Surgery

## 2017-01-04 ENCOUNTER — Inpatient Hospital Stay: Admission: RE | Admit: 2017-01-04 | Source: Ambulatory Visit

## 2017-01-04 ENCOUNTER — Other Ambulatory Visit: Payer: Self-pay | Admitting: General Surgery

## 2017-01-04 DIAGNOSIS — R928 Other abnormal and inconclusive findings on diagnostic imaging of breast: Secondary | ICD-10-CM

## 2017-01-04 NOTE — H&P (Signed)
[place] 11/24/2016 8:28 AM Location: [facility] Patient #: [id number] DOB: October 06, 1970 Married / Language: English / Race: White Female   History of Present Illness       The patient is a 46 year old female who presents with a breast mass. This is a 46 year old female from [place]. Her husband is with her throughout the encounter. She is referred by Dr. 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 Dr. Rita Ohara is her PCP. Allyn Kenner, D.O. is her gynecologist.      She has no history of prior breast problems. She does have some chronic pain that is cyclical. She is been getting screening mammograms for a few years. Recent mammogram showed category C density, 1.2 cm area of distortion right breast lower outer quadrant at 7 o'clock position. The radiologist stated there was a palpable thickening. 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       Past history reveals history of irritable bowel syndrome managed by her PCP, robotic hysterectomy without BSO, Mnire's disease, history of maxillary surgery for occlusion Family history negative for breast or ovarian cancer. Mother living with diabetes and hypertension. Father living with memory disorder. Social history reveals she is married. Her husband is with her throughout the encounter. 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 3 children. Denies tobacco or alcohol.      I told her that she probably does not have cancer but that the literature indicates a 4-9% risk of concurrent early stage breast cancer. She wants this area removed. I think that is appropriate. She'll be scheduled for right breast lumpectomy with radioactive seed localization. I might be able to do a hidden scar circumareolar incision depending on imaging findings.  She agrees with this plan.    Past Surgical History   Breast Biopsy  Right. Cesarean Section - Multiple  Hysterectomy (not due to cancer) - Partial  Oral Surgery   Diagnostic Studies History  Colonoscopy  >10 years ago Mammogram  within last year  Allergies  Sulfa Antibiotics   Medication History  Amitiza (8MCG Capsule, Oral) Active. Fluticasone Propionate (50MCG/ACT Suspension, Nasal) Active. Sertraline HCl (100MG  Tablet, Oral) Active. Lactobacillus (Oral) Active. Fexofenadine HCl (180MG  Tablet, Oral) Active. Biotin (10MG  Tablet, Oral) Active. Prevacid (15MG  Capsule DR, Oral) Active. Valtrex (500MG  Tablet, Oral) Active. Gentamicin Sulfate (40MG /ML Solution, Injection) Active. Medications Reconciled  Social History  Caffeine use  Carbonated beverages, Tea. No alcohol use  No drug use  Tobacco use  Never smoker.  Family History Arthritis  Mother. Depression  Mother. Ischemic Bowel Disease  Mother. Melanoma  Father. Thyroid problems  Mother.  Pregnancy / Birth History  Age at menarche  55 years. Age of menopause  76-50 Contraceptive History  Oral contraceptives. Gravida  4 Length (months) of breastfeeding  7-12 Maternal age  35-30 Para  3  Other Problems  Depression  General anesthesia - complications  Lump In Breast     Review of Systems  General Present- Fatigue, Night Sweats and Weight Gain. Not Present- Appetite Loss, Chills, Fever and Weight Loss. Skin Not Present- Change in Wart/Mole, Dryness, Hives, Jaundice, New Lesions, Non-Healing Wounds, Rash and Ulcer. HEENT Present- Seasonal Allergies, Sinus Pain and Wears glasses/contact lenses. Not Present- Earache, Hearing Loss, Hoarseness, Nose Bleed, Oral Ulcers, Ringing in the Ears, Sore Throat, Visual Disturbances and Yellow Eyes. Breast Present- Breast Mass and Breast Pain. Not Present- Nipple Discharge  and Skin Changes. Cardiovascular Not Present- Chest Pain, Difficulty Breathing Lying Down, Leg Cramps, Palpitations,  Rapid Heart Rate, Shortness of Breath and Swelling of Extremities. Gastrointestinal Present- Abdominal Pain, Bloating, Constipation and Nausea. Not Present- Bloody Stool, Change in Bowel Habits, Chronic diarrhea, Difficulty Swallowing, Excessive gas, Gets full quickly at meals, Hemorrhoids, Indigestion, Rectal Pain and Vomiting. Female Genitourinary Not Present- Frequency, Nocturia, Painful Urination, Pelvic Pain and Urgency. Musculoskeletal Present- Joint Pain and Muscle Pain. Not Present- Back Pain, Joint Stiffness, Muscle Weakness and Swelling of Extremities. Neurological Present- Headaches. Not Present- Decreased Memory, Fainting, Numbness, Seizures, Tingling, Tremor, Trouble walking and Weakness. Psychiatric Present- Depression. Not Present- Anxiety, Bipolar, Change in Sleep Pattern, Fearful and Frequent crying. Endocrine Present- Hot flashes. Not Present- Cold Intolerance, Excessive Hunger, Hair Changes, Heat Intolerance and New Diabetes. Hematology Not Present- Blood Thinners, Easy Bruising, Excessive bleeding, Gland problems, HIV and Persistent Infections.  Vitals  Weight: 163.2 lb Height: 66in Body Surface Area: 1.83 m Body Mass Index: 26.34 kg/m  Temp.: 40F  Pulse: 103 (Regular)  BP: 114/68 (Sitting, Left Arm, Standard)    Physical Exam  General Mental Status-Alert. General Appearance-Consistent with stated age. Hydration-Well hydrated. Voice-Normal.  Head and Neck Head-normocephalic, atraumatic with no lesions or palpable masses. Trachea-midline. Thyroid Gland Characteristics - normal size and consistency.  Eye Eyeball - Bilateral-Extraocular movements intact. Sclera/Conjunctiva - Bilateral-No scleral icterus.  Chest and Lung Exam Chest and lung exam reveals -quiet, even and easy respiratory effort with no use of accessory muscles and on auscultation, normal breath sounds, no adventitious sounds and normal vocal  resonance. Inspection Chest Wall - Normal. Back - normal.  Breast Note: Breasts are reasonably generous in size. There is a little palpable thickening tenderness and ecchymoses in the right breast at the 7 o'clock position just outside the areolar margin. No other skin change or mass in either breast. No axillary adenopathy.   Cardiovascular Cardiovascular examination reveals -normal heart sounds, regular rate and rhythm with no murmurs and normal pedal pulses bilaterally.  Abdomen Inspection Inspection of the abdomen reveals - No Hernias. Skin - Scar - Note: Healed trocar scars from robotic hysterectomy and prior tubal ligation. Palpation/Percussion Palpation and Percussion of the abdomen reveal - Soft, Non Tender, No Rebound tenderness, No Rigidity (guarding) and No hepatosplenomegaly. Auscultation Auscultation of the abdomen reveals - Bowel sounds normal.  Neurologic Neurologic evaluation reveals -alert and oriented x 3 with no impairment of recent or remote memory. Mental Status-Normal.  Musculoskeletal Normal Exam - Left-Upper Extremity Strength Normal and Lower Extremity Strength Normal. Normal Exam - Right-Upper Extremity Strength Normal and Lower Extremity Strength Normal.  Lymphatic Head & Neck  General Head & Neck Lymphatics: Bilateral - Description - Normal. Axillary  General Axillary Region: Bilateral - Description - Normal. Tenderness - Non Tender. Femoral & Inguinal  Generalized Femoral & Inguinal Lymphatics: Bilateral - Description - Normal. Tenderness - Non Tender.    Assessment & Plan  ABNORMALITY OF RIGHT BREAST ON SCREENING MAMMOGRAM (R92.8)    Your recent mammogram show an area of distortion in the right breast, lower outer quadrant, 7 o'clock position. This appears to be approximately 1.2 cm in diameter Image guided biopsies show a condition called complex sclerosing lesion You probably do not have cancer, but there is a 4-9% chance that  it could be low-grade cancer currently I have advised conservative lumpectomy with radioactive seed localization You have stated that you would like to go ahead and have this done in June  you will be scheduled  for right breast lumpctomy with radioactive seed localization. Please read the printed information that I have given him I discussed the indications, techniques, and risk of this surgery in detail with you and your husband  IRRITABLE BOWEL SYNDROME, UNSPECIFIED TYPE (K58.9) HISTORY OF HYSTERECTOMY (Z90.710)    Edsel Petrin. Dalbert Batman, M.D., [facility] General and Minimally invasive Surgery Breast and Colorectal Surgery Office:   [phone] Pager:   [phone]

## 2017-01-04 NOTE — Progress Notes (Signed)
Pt in to pick up boost breeze, instructions reviewed. 

## 2017-01-06 ENCOUNTER — Ambulatory Visit
Admission: RE | Admit: 2017-01-06 | Discharge: 2017-01-06 | Disposition: A | Discharge status: Home / Self Care | Source: Ambulatory Visit | Attending: General Surgery | Admitting: General Surgery

## 2017-01-06 ENCOUNTER — Ambulatory Visit (HOSPITAL_BASED_OUTPATIENT_CLINIC_OR_DEPARTMENT_OTHER): Admitting: Certified Registered"

## 2017-01-06 ENCOUNTER — Encounter (HOSPITAL_BASED_OUTPATIENT_CLINIC_OR_DEPARTMENT_OTHER): Payer: Self-pay | Admitting: Certified Registered"

## 2017-01-06 ENCOUNTER — Encounter (HOSPITAL_BASED_OUTPATIENT_CLINIC_OR_DEPARTMENT_OTHER)
Admission: RE | Disposition: A | Discharge status: Home / Self Care | Payer: Self-pay | Source: Ambulatory Visit | Attending: General Surgery

## 2017-01-06 ENCOUNTER — Ambulatory Visit (HOSPITAL_BASED_OUTPATIENT_CLINIC_OR_DEPARTMENT_OTHER)
Admission: RE | Admit: 2017-01-06 | Discharge: 2017-01-06 | Disposition: A | Discharge status: Home / Self Care | Source: Ambulatory Visit | Attending: General Surgery | Admitting: General Surgery

## 2017-01-06 DIAGNOSIS — Z8349 Family history of other endocrine, nutritional and metabolic diseases: Secondary | ICD-10-CM | POA: Insufficient documentation

## 2017-01-06 DIAGNOSIS — Z818 Family history of other mental and behavioral disorders: Secondary | ICD-10-CM | POA: Diagnosis not present

## 2017-01-06 DIAGNOSIS — Z79899 Other long term (current) drug therapy: Secondary | ICD-10-CM | POA: Diagnosis not present

## 2017-01-06 DIAGNOSIS — D241 Benign neoplasm of right breast: Secondary | ICD-10-CM | POA: Insufficient documentation

## 2017-01-06 DIAGNOSIS — R928 Other abnormal and inconclusive findings on diagnostic imaging of breast: Secondary | ICD-10-CM | POA: Diagnosis present

## 2017-01-06 DIAGNOSIS — H8109 Meniere's disease, unspecified ear: Secondary | ICD-10-CM | POA: Diagnosis not present

## 2017-01-06 DIAGNOSIS — Z8379 Family history of other diseases of the digestive system: Secondary | ICD-10-CM | POA: Diagnosis not present

## 2017-01-06 DIAGNOSIS — Z833 Family history of diabetes mellitus: Secondary | ICD-10-CM | POA: Insufficient documentation

## 2017-01-06 DIAGNOSIS — Z8261 Family history of arthritis: Secondary | ICD-10-CM | POA: Insufficient documentation

## 2017-01-06 DIAGNOSIS — D649 Anemia, unspecified: Secondary | ICD-10-CM | POA: Insufficient documentation

## 2017-01-06 DIAGNOSIS — N6021 Fibroadenosis of right breast: Secondary | ICD-10-CM | POA: Insufficient documentation

## 2017-01-06 DIAGNOSIS — Z882 Allergy status to sulfonamides status: Secondary | ICD-10-CM | POA: Insufficient documentation

## 2017-01-06 DIAGNOSIS — Z90710 Acquired absence of both cervix and uterus: Secondary | ICD-10-CM | POA: Insufficient documentation

## 2017-01-06 DIAGNOSIS — F329 Major depressive disorder, single episode, unspecified: Secondary | ICD-10-CM | POA: Insufficient documentation

## 2017-01-06 DIAGNOSIS — K589 Irritable bowel syndrome without diarrhea: Secondary | ICD-10-CM | POA: Diagnosis not present

## 2017-01-06 DIAGNOSIS — Z8249 Family history of ischemic heart disease and other diseases of the circulatory system: Secondary | ICD-10-CM | POA: Diagnosis not present

## 2017-01-06 DIAGNOSIS — G8929 Other chronic pain: Secondary | ICD-10-CM | POA: Insufficient documentation

## 2017-01-06 DIAGNOSIS — N6011 Diffuse cystic mastopathy of right breast: Secondary | ICD-10-CM | POA: Insufficient documentation

## 2017-01-06 DIAGNOSIS — N63 Unspecified lump in unspecified breast: Secondary | ICD-10-CM | POA: Diagnosis present

## 2017-01-06 HISTORY — DX: Other abnormal and inconclusive findings on diagnostic imaging of breast: R92.8

## 2017-01-06 HISTORY — PX: BREAST LUMPECTOMY WITH RADIOACTIVE SEED LOCALIZATION: SHX6424

## 2017-01-06 SURGERY — BREAST LUMPECTOMY WITH RADIOACTIVE SEED LOCALIZATION
Anesthesia: General | Site: Breast | Laterality: Right

## 2017-01-06 MED ORDER — CHLORHEXIDINE GLUCONATE CLOTH 2 % EX PADS: 6.0000 | MEDICATED_PAD | Freq: Once | CUTANEOUS | Status: DC

## 2017-01-06 MED ORDER — OXYCODONE HCL 5 MG PO TABS
ORAL_TABLET | ORAL | Status: AC
  Filled 2017-01-06: qty 1

## 2017-01-06 MED ORDER — SCOPOLAMINE 1 MG/3DAYS TD PT72
1.0000 | MEDICATED_PATCH | Freq: Once | TRANSDERMAL | Status: AC | PRN
  Administered 2017-01-06: 1 via TRANSDERMAL

## 2017-01-06 MED ORDER — ONDANSETRON HCL 4 MG/2ML IJ SOLN
INTRAMUSCULAR | Status: AC
  Filled 2017-01-06: qty 2

## 2017-01-06 MED ORDER — OXYCODONE HCL 5 MG/5ML PO SOLN: 5.0000 mg | Freq: Once | ORAL | Status: AC | PRN

## 2017-01-06 MED ORDER — GABAPENTIN 300 MG PO CAPS
ORAL_CAPSULE | ORAL | Status: AC
  Filled 2017-01-06: qty 1

## 2017-01-06 MED ORDER — GABAPENTIN 300 MG PO CAPS
300.0000 mg | ORAL_CAPSULE | ORAL | Status: AC
  Administered 2017-01-06: 300 mg via ORAL

## 2017-01-06 MED ORDER — CELECOXIB 200 MG PO CAPS
ORAL_CAPSULE | ORAL | Status: AC
  Filled 2017-01-06: qty 2

## 2017-01-06 MED ORDER — PROPOFOL 10 MG/ML IV BOLUS
INTRAVENOUS | Status: DC | PRN
  Administered 2017-01-06: 150 mg via INTRAVENOUS

## 2017-01-06 MED ORDER — FENTANYL CITRATE (PF) 100 MCG/2ML IJ SOLN
INTRAMUSCULAR | Status: AC
  Filled 2017-01-06: qty 2

## 2017-01-06 MED ORDER — FENTANYL CITRATE (PF) 100 MCG/2ML IJ SOLN
50.0000 ug | INTRAMUSCULAR | Status: DC | PRN
  Administered 2017-01-06 (×2): 50 ug via INTRAVENOUS

## 2017-01-06 MED ORDER — PROMETHAZINE HCL 25 MG/ML IJ SOLN: 6.2500 mg | INTRAMUSCULAR | Status: DC | PRN

## 2017-01-06 MED ORDER — CEFAZOLIN SODIUM-DEXTROSE 2-4 GM/100ML-% IV SOLN
INTRAVENOUS | Status: AC
  Filled 2017-01-06: qty 100

## 2017-01-06 MED ORDER — FENTANYL CITRATE (PF) 100 MCG/2ML IJ SOLN: 25.0000 ug | INTRAMUSCULAR | Status: DC | PRN

## 2017-01-06 MED ORDER — LACTATED RINGERS IV SOLN
INTRAVENOUS | Status: DC
  Administered 2017-01-06 (×2): via INTRAVENOUS

## 2017-01-06 MED ORDER — MIDAZOLAM HCL 2 MG/2ML IJ SOLN
INTRAMUSCULAR | Status: AC
  Filled 2017-01-06: qty 2

## 2017-01-06 MED ORDER — LACTATED RINGERS IV SOLN: INTRAVENOUS | Status: DC

## 2017-01-06 MED ORDER — ONDANSETRON HCL 4 MG/2ML IJ SOLN
INTRAMUSCULAR | Status: DC | PRN
  Administered 2017-01-06: 4 mg via INTRAVENOUS

## 2017-01-06 MED ORDER — DEXAMETHASONE SODIUM PHOSPHATE 10 MG/ML IJ SOLN
INTRAMUSCULAR | Status: AC
  Filled 2017-01-06: qty 1

## 2017-01-06 MED ORDER — ACETAMINOPHEN 500 MG PO TABS
1000.0000 mg | ORAL_TABLET | ORAL | Status: AC
  Administered 2017-01-06: 1000 mg via ORAL

## 2017-01-06 MED ORDER — MEPERIDINE HCL 25 MG/ML IJ SOLN: 6.2500 mg | INTRAMUSCULAR | Status: DC | PRN

## 2017-01-06 MED ORDER — BUPIVACAINE-EPINEPHRINE (PF) 0.5% -1:200000 IJ SOLN
INTRAMUSCULAR | Status: DC | PRN
  Administered 2017-01-06: 10 mL

## 2017-01-06 MED ORDER — MIDAZOLAM HCL 2 MG/2ML IJ SOLN
1.0000 mg | INTRAMUSCULAR | Status: DC | PRN
  Administered 2017-01-06: 2 mg via INTRAVENOUS

## 2017-01-06 MED ORDER — OXYCODONE HCL 5 MG PO TABS
5.0000 mg | ORAL_TABLET | Freq: Once | ORAL | Status: AC | PRN
  Administered 2017-01-06: 5 mg via ORAL

## 2017-01-06 MED ORDER — PROPOFOL 500 MG/50ML IV EMUL
INTRAVENOUS | Status: AC
  Filled 2017-01-06: qty 50

## 2017-01-06 MED ORDER — LIDOCAINE 2% (20 MG/ML) 5 ML SYRINGE
INTRAMUSCULAR | Status: AC
  Filled 2017-01-06: qty 5

## 2017-01-06 MED ORDER — LIDOCAINE HCL (CARDIAC) 20 MG/ML IV SOLN
INTRAVENOUS | Status: DC | PRN
  Administered 2017-01-06: 60 mg via INTRAVENOUS

## 2017-01-06 MED ORDER — BUPIVACAINE-EPINEPHRINE (PF) 0.5% -1:200000 IJ SOLN
INTRAMUSCULAR | Status: AC
  Filled 2017-01-06: qty 60

## 2017-01-06 MED ORDER — DEXAMETHASONE SODIUM PHOSPHATE 4 MG/ML IJ SOLN
INTRAMUSCULAR | Status: DC | PRN
  Administered 2017-01-06: 10 mg via INTRAVENOUS

## 2017-01-06 MED ORDER — HYDROCODONE-ACETAMINOPHEN 5-325 MG PO TABS: 1.0000 | ORAL_TABLET | Freq: Four times a day (QID) | ORAL | 0 refills | Status: DC | PRN

## 2017-01-06 MED ORDER — CELECOXIB 400 MG PO CAPS
400.0000 mg | ORAL_CAPSULE | ORAL | Status: AC
  Administered 2017-01-06: 400 mg via ORAL

## 2017-01-06 MED ORDER — ACETAMINOPHEN 500 MG PO TABS
ORAL_TABLET | ORAL | Status: AC
  Filled 2017-01-06: qty 2

## 2017-01-06 MED ORDER — CEFAZOLIN SODIUM-DEXTROSE 2-4 GM/100ML-% IV SOLN
2.0000 g | INTRAVENOUS | Status: AC
  Administered 2017-01-06: 2 g via INTRAVENOUS

## 2017-01-06 SURGICAL SUPPLY — 64 items
APPLIER CLIP 9.375 MED OPEN (MISCELLANEOUS) ×3
BENZOIN TINCTURE PRP APPL 2/3 (GAUZE/BANDAGES/DRESSINGS) IMPLANT
BINDER BREAST LRG (GAUZE/BANDAGES/DRESSINGS) ×3 IMPLANT
BINDER BREAST MEDIUM (GAUZE/BANDAGES/DRESSINGS) IMPLANT
BINDER BREAST XLRG (GAUZE/BANDAGES/DRESSINGS) IMPLANT
BINDER BREAST XXLRG (GAUZE/BANDAGES/DRESSINGS) IMPLANT
BLADE HEX COATED 2.75 (ELECTRODE) ×3 IMPLANT
BLADE SURG 10 STRL SS (BLADE) IMPLANT
BLADE SURG 15 STRL LF DISP TIS (BLADE) ×1 IMPLANT
BLADE SURG 15 STRL SS (BLADE) ×2
CANISTER SUC SOCK COL 7IN (MISCELLANEOUS) IMPLANT
CANISTER SUCT 1200ML W/VALVE (MISCELLANEOUS) ×3 IMPLANT
CHLORAPREP W/TINT 26ML (MISCELLANEOUS) ×3 IMPLANT
CLIP APPLIE 9.375 MED OPEN (MISCELLANEOUS) ×1 IMPLANT
CLOSURE WOUND 1/2 X4 (GAUZE/BANDAGES/DRESSINGS)
COVER BACK TABLE 60X90IN (DRAPES) ×3 IMPLANT
COVER MAYO STAND STRL (DRAPES) ×3 IMPLANT
COVER PROBE W GEL 5X96 (DRAPES) ×3 IMPLANT
DECANTER SPIKE VIAL GLASS SM (MISCELLANEOUS) IMPLANT
DERMABOND ADVANCED (GAUZE/BANDAGES/DRESSINGS) ×2
DERMABOND ADVANCED .7 DNX12 (GAUZE/BANDAGES/DRESSINGS) ×1 IMPLANT
DEVICE DUBIN W/COMP PLATE 8390 (MISCELLANEOUS) ×3 IMPLANT
DRAPE LAPAROSCOPIC ABDOMINAL (DRAPES) ×3 IMPLANT
DRAPE UTILITY XL STRL (DRAPES) ×3 IMPLANT
DRSG PAD ABDOMINAL 8X10 ST (GAUZE/BANDAGES/DRESSINGS) ×3 IMPLANT
ELECT REM PT RETURN 9FT ADLT (ELECTROSURGICAL) ×3
ELECTRODE REM PT RTRN 9FT ADLT (ELECTROSURGICAL) ×1 IMPLANT
GAUZE SPONGE 4X4 12PLY STRL LF (GAUZE/BANDAGES/DRESSINGS) ×6 IMPLANT
GLOVE BIOGEL PI IND STRL 7.0 (GLOVE) ×1 IMPLANT
GLOVE BIOGEL PI INDICATOR 7.0 (GLOVE) ×2
GLOVE EUDERMIC 7 POWDERFREE (GLOVE) ×3 IMPLANT
GLOVE EXAM NITRILE EXT CUFF MD (GLOVE) ×3 IMPLANT
GLOVE SURG SYN 7.5  E (GLOVE) ×2
GLOVE SURG SYN 7.5 E (GLOVE) ×1 IMPLANT
GOWN STRL REUS W/ TWL LRG LVL3 (GOWN DISPOSABLE) ×1 IMPLANT
GOWN STRL REUS W/ TWL XL LVL3 (GOWN DISPOSABLE) ×1 IMPLANT
GOWN STRL REUS W/TWL LRG LVL3 (GOWN DISPOSABLE) ×2
GOWN STRL REUS W/TWL XL LVL3 (GOWN DISPOSABLE) ×2
ILLUMINATOR WAVEGUIDE N/F (MISCELLANEOUS) IMPLANT
KIT MARKER MARGIN INK (KITS) ×3 IMPLANT
LIGHT WAVEGUIDE WIDE FLAT (MISCELLANEOUS) IMPLANT
NEEDLE HYPO 25X1 1.5 SAFETY (NEEDLE) ×3 IMPLANT
NS IRRIG 1000ML POUR BTL (IV SOLUTION) ×3 IMPLANT
PACK BASIN DAY SURGERY FS (CUSTOM PROCEDURE TRAY) ×3 IMPLANT
PENCIL BUTTON HOLSTER BLD 10FT (ELECTRODE) ×3 IMPLANT
SHEET MEDIUM DRAPE 40X70 STRL (DRAPES) IMPLANT
SLEEVE SCD COMPRESS KNEE MED (MISCELLANEOUS) ×3 IMPLANT
SPONGE LAP 18X18 X RAY DECT (DISPOSABLE) IMPLANT
SPONGE LAP 4X18 X RAY DECT (DISPOSABLE) ×3 IMPLANT
STRIP CLOSURE SKIN 1/2X4 (GAUZE/BANDAGES/DRESSINGS) IMPLANT
SUT ETHILON 3 0 FSL (SUTURE) IMPLANT
SUT MNCRL AB 4-0 PS2 18 (SUTURE) ×3 IMPLANT
SUT SILK 2 0 SH (SUTURE) ×3 IMPLANT
SUT VIC AB 2-0 CT1 27 (SUTURE)
SUT VIC AB 2-0 CT1 TAPERPNT 27 (SUTURE) IMPLANT
SUT VIC AB 3-0 SH 27 (SUTURE)
SUT VIC AB 3-0 SH 27X BRD (SUTURE) IMPLANT
SUT VICRYL 3-0 CR8 SH (SUTURE) ×3 IMPLANT
SYR 10ML LL (SYRINGE) ×3 IMPLANT
TOWEL OR 17X24 6PK STRL BLUE (TOWEL DISPOSABLE) ×3 IMPLANT
TOWEL OR NON WOVEN STRL DISP B (DISPOSABLE) IMPLANT
TUBE CONNECTING 20'X1/4 (TUBING) ×1
TUBE CONNECTING 20X1/4 (TUBING) ×2 IMPLANT
YANKAUER SUCT BULB TIP NO VENT (SUCTIONS) ×3 IMPLANT

## 2017-01-06 NOTE — Anesthesia Preprocedure Evaluation (Addendum)
Anesthesia Evaluation  Patient identified by MRN, date of birth, ID band  Reviewed: Allergy & Precautions, NPO status , Patient's Chart, lab work & pertinent test results  History of Anesthesia Complications (+) PONV  Airway Mallampati: II  TM Distance: >3 FB Neck ROM: Full    Dental  (+) Teeth Intact, Dental Advisory Given   Pulmonary neg pulmonary ROS,    breath sounds clear to auscultation       Cardiovascular negative cardio ROS   Rhythm:Regular Rate:Normal     Neuro/Psych PSYCHIATRIC DISORDERS Depression negative neurological ROS     GI/Hepatic negative GI ROS, Neg liver ROS,   Endo/Other  negative endocrine ROS  Renal/GU negative Renal ROS  negative genitourinary   Musculoskeletal negative musculoskeletal ROS (+)   Abdominal   Peds negative pediatric ROS (+)  Hematology  (+) anemia ,   Anesthesia Other Findings   Reproductive/Obstetrics negative OB ROS                           Anesthesia Physical Anesthesia Plan  ASA: II  Anesthesia Plan: General   Post-op Pain Management:  Regional for Post-op pain   Induction: Intravenous  PONV Risk Score and Plan: 4 or greater and Ondansetron, Dexamethasone, Propofol, Midazolam and Scopolamine patch - Pre-op  Airway Management Planned: LMA  Additional Equipment:   Intra-op Plan:   Post-operative Plan: Extubation in OR  Informed Consent: I have reviewed the patients History and Physical, chart, labs and discussed the procedure including the risks, benefits and alternatives for the proposed anesthesia with the patient or authorized representative who has indicated his/her understanding and acceptance.   Dental advisory given  Plan Discussed with: CRNA  Anesthesia Plan Comments:         Anesthesia Quick Evaluation

## 2017-01-06 NOTE — Op Note (Signed)
Patient Name:           Lauren Blanchard   Date of Surgery:        01/06/2017  Pre op Diagnosis:      Complex sclerosing lesion right breast, lower outer quadrant  Post op Diagnosis:    Same  Procedure:                 Right breast lumpectomy with radioactive seed localization  Surgeon:                      M. , M.D., FACS  Assistant:                      OR staff   Indication for Assistant: N/A  Operative Indications:     This is a 46-year-old female who is referred by Dr. Jarosz at [facility] [place] for evaluation of complex sclerosing lesion right breast, lower outer quadrant, 7 o'clock position. Dr. Eve Knapp is her PCP. Sidney Callahan, D.O. is her gynecologist.      She has no history of prior breast problems. She does have some chronic pain that is cyclical. She is been getting screening mammograms for a few years. Recent mammogram showed category C density, 1.2 cm area of distortion right breast lower outer quadrant at 7 o'clock position. The radiologist stated there was a palpable thickening. 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       Past history reveals history of irritable bowel syndrome managed by her PCP, robotic hysterectomy without BSO, Mnire's disease, history of maxillary surgery for occlusion Family history negative for breast or ovarian cancer.       I told her that she probably does not have cancer but that the literature indicates a 4-9% risk of concurrent early stage breast cancer. She wants this area removed. I think that is appropriate. She'll be scheduled for right breast lumpectomy with radioactive seed localization. I might be able to do a hidden scar circumareolar incision depending on imaging findings.  She agrees with this plan.   Operative Findings:       The radioactive seed and signal were at the areolar margin at the 7:00 position.  I was able to make a circumareolar scar, hidden scar technique at the areolar  margin.  The specimen mammogram looked good with the biopsy clip and the radioactive seed in the center of the specimen.  There was no gross palpable abnormality  Procedure in Detail:          Following the induction of general LMA anesthesia the patient's right breast was prepped and draped in a sterile fashion, surgical timeout performed, intravenous antibiotics given.  0.5% Marcaine with epinephrine was used as local infiltration anesthetic.     After careful planning with the neoprobe and marking pin I made a curvilinear incision at the areolar margin inferiorly.  Lumpectomy was performed using the neoprobe and electrocautery.  The specimen was removed and marked with silk sutures and a 6 color ink kit To orient the pathologist.  The specimen mammogram looked very good and the specimen was marked and sent to the lab.  Hemostasis excellent and achieved with electrocautery.  Lumpectomy cavity was marked with metal clips.  The breast tissues were closed in several layers with interrupted 3-0 Vicryl sutures and the skin closed with running subcuticular 4-0 Monocryl and Dermabond.  Breast binder was placed and the patient taken to PACU in stable   condition.  EBL 10 mL.  Counts correct.  Complications none.      M. , M.D., FACS General and Minimally Invasive Surgery Breast and Colorectal Surgery    Addendum: I logged onto the [facility] website and reviewed her prescription medication history    01/06/2017 8:23 AM  

## 2017-01-06 NOTE — Anesthesia Procedure Notes (Signed)
Procedure Name: LMA Insertion Date/Time: 01/06/2017 7:40 AM Performed by: January Bergthold D Pre-anesthesia Checklist: Patient identified, Emergency Drugs available, Suction available and Patient being monitored Patient Re-evaluated:Patient Re-evaluated prior to inductionOxygen Delivery Method: Circle system utilized Preoxygenation: Pre-oxygenation with 100% oxygen Intubation Type: IV induction Ventilation: Mask ventilation without difficulty LMA: LMA inserted LMA Size: 3.0 Number of attempts: 1 Airway Equipment and Method: Bite block Placement Confirmation: positive ETCO2 Tube secured with: Tape Dental Injury: Teeth and Oropharynx as per pre-operative assessment

## 2017-01-06 NOTE — Discharge Instructions (Signed)
[facility] °Office Phone Number [phone] ° °BREAST BIOPSY/ PARTIAL MASTECTOMY: POST OP INSTRUCTIONS ° °Always review your discharge instruction sheet given to you by the facility where your surgery was performed. ° °IF YOU HAVE DISABILITY OR FAMILY LEAVE FORMS, YOU MUST BRING THEM TO THE OFFICE FOR PROCESSING.  DO NOT GIVE THEM TO YOUR DOCTOR. ° °1. A prescription for pain medication may be given to you upon discharge.  Take your pain medication as prescribed, if needed.  If narcotic pain medicine is not needed, then you may take acetaminophen (Tylenol) or ibuprofen (Advil) as needed. °2. Take your usually prescribed medications unless otherwise directed °3. If you need a refill on your pain medication, please contact your pharmacy.  They will contact our office to request authorization.  Prescriptions will not be filled after 5pm or on week-ends. °4. You should eat very light the first 24 hours after surgery, such as soup, crackers, pudding, etc.  Resume your normal diet the day after surgery. °5. Most patients will experience some swelling and bruising in the breast.  Ice packs and a good support bra will help.  Swelling and bruising can take several days to resolve.  °6. It is common to experience some constipation if taking pain medication after surgery.  Increasing fluid intake and taking a stool softener will usually help or prevent this problem from occurring.  A mild laxative (Milk of Magnesia or Miralax) should be taken according to package directions if there are no bowel movements after 48 hours. °7. Unless discharge instructions indicate otherwise, you may remove your bandages 24-48 hours after surgery, and you may shower at that time.  You may have steri-strips (small skin tapes) in place directly over the incision.  These strips should be left on the skin for 7-10 days.  If your surgeon used skin glue on the incision, you may shower in 24 hours.  The glue will flake off over the  next 2-3 weeks.  Any sutures or staples will be removed at the office during your follow-up visit. °8. ACTIVITIES:  You may resume regular daily activities (gradually increasing) beginning the next day.  Wearing a good support bra or sports bra minimizes pain and swelling.  You may have sexual intercourse when it is comfortable. °a. You may drive when you no longer are taking prescription pain medication, you can comfortably wear a seatbelt, and you can safely maneuver your car and apply brakes. °b. RETURN TO WORK:  ______________________________________________________________________________________ °9. You should see your doctor in the office for a follow-up appointment approximately two weeks after your surgery.  Your doctor’s nurse will typically make your follow-up appointment when she calls you with your pathology report.  Expect your pathology report 2-3 business days after your surgery.  You may call to check if you do not hear from us after three days. °10. OTHER INSTRUCTIONS: _______________________________________________________________________________________________ _____________________________________________________________________________________________________________________________________ °_____________________________________________________________________________________________________________________________________ °_____________________________________________________________________________________________________________________________________ ° °WHEN TO CALL YOUR DOCTOR: °1. Fever over 101.0 °2. Nausea and/or vomiting. °3. Extreme swelling or bruising. °4. Continued bleeding from incision. °5. Increased pain, redness, or drainage from the incision. ° °The clinic staff is available to answer your questions during regular business hours.  Please don’t hesitate to call and ask to speak to one of the nurses for clinical concerns.  If you have a medical emergency, go to the nearest  emergency room or call 911.  A surgeon from [facility] is always on call at the hospital. ° °For further questions, please visit [email or web address]  ° ° ° ° °  Post Anesthesia Home Care Instructions ° °Activity: °Get plenty of rest for the remainder of the day. A responsible individual must stay with you for 24 hours following the procedure.  °For the next 24 hours, DO NOT: °-Drive a car °-Operate machinery °-Drink alcoholic beverages °-Take any medication unless instructed by your physician °-Make any legal decisions or sign important papers. ° °Meals: °Start with liquid foods such as gelatin or soup. Progress to regular foods as tolerated. Avoid greasy, spicy, heavy foods. If nausea and/or vomiting occur, drink only clear liquids until the nausea and/or vomiting subsides. Call your physician if vomiting continues. ° °Special Instructions/Symptoms: °Your throat may feel dry or sore from the anesthesia or the breathing tube placed in your throat during surgery. If this causes discomfort, gargle with warm salt water. The discomfort should disappear within 24 hours. ° °If you had a scopolamine patch placed behind your ear for the management of post- operative nausea and/or vomiting: ° °1. The medication in the patch is effective for 72 hours, after which it should be removed.  Wrap patch in a tissue and discard in the trash. Wash hands thoroughly with soap and water. °2. You may remove the patch earlier than 72 hours if you experience unpleasant side effects which may include dry mouth, dizziness or visual disturbances. °3. Avoid touching the patch. Wash your hands with soap and water after contact with the patch. °  ° °

## 2017-01-06 NOTE — Anesthesia Postprocedure Evaluation (Signed)
Anesthesia Post Note  Patient: Melrose Kearse  Procedure(s) Performed: Procedure(s) (LRB): RIGHT BREAST LUMPECTOMY WITH RADIOACTIVE SEED LOCALIZATION (Right)     Patient location during evaluation: PACU Anesthesia Type: General Level of consciousness: awake and alert Pain management: pain level controlled Vital Signs Assessment: post-procedure vital signs reviewed and stable Respiratory status: spontaneous breathing, nonlabored ventilation, respiratory function stable and patient connected to nasal cannula oxygen Cardiovascular status: blood pressure returned to baseline and stable Postop Assessment: no signs of nausea or vomiting Anesthetic complications: no    Last Vitals:  Vitals:   01/06/17 0900 01/06/17 0901  BP: (!) 147/88   Pulse: 81 78  Resp: 17 15  Temp:      Last Pain:  Vitals:   01/06/17 0900  TempSrc:   PainSc: 0-No pain                 Effie Berkshire

## 2017-01-06 NOTE — Interval H&P Note (Signed)
History and Physical Interval Note:  01/06/2017 7:00 AM  Lauren Blanchard  has presented today for surgery, with the diagnosis of abnormal mammogram right breast  The various methods of treatment have been discussed with the patient and family. After consideration of risks, benefits and other options for treatment, the patient has consented to  Procedure(s): RIGHT BREAST LUMPECTOMY WITH RADIOACTIVE SEED LOCALIZATION (Right) as a surgical intervention .  The patient's history has been reviewed, patient examined, no change in status, stable for surgery.  I have reviewed the patient's chart and labs.  Questions were answered to the patient's satisfaction.     Adin Hector

## 2017-01-06 NOTE — Anesthesia Procedure Notes (Signed)
Procedure Name: LMA Insertion Date/Time: 01/06/2017 7:40 AM Performed by: Aziz Slape D Pre-anesthesia Checklist: Patient identified, Emergency Drugs available, Suction available and Patient being monitored Patient Re-evaluated:Patient Re-evaluated prior to inductionOxygen Delivery Method: Circle system utilized Preoxygenation: Pre-oxygenation with 100% oxygen Intubation Type: IV induction Ventilation: Mask ventilation without difficulty LMA: LMA inserted LMA Size: 3.0 Number of attempts: 1 Airway Equipment and Method: Bite block Placement Confirmation: positive ETCO2 Tube secured with: Tape Dental Injury: Teeth and Oropharynx as per pre-operative assessment

## 2017-01-06 NOTE — Transfer of Care (Signed)
Immediate Anesthesia Transfer of Care Note  Patient: Lauren Blanchard  Procedure(s) Performed: Procedure(s): RIGHT BREAST LUMPECTOMY WITH RADIOACTIVE SEED LOCALIZATION (Right)  Patient Location: PACU  Anesthesia Type:General  Level of Consciousness: awake and patient cooperative  Airway & Oxygen Therapy: Patient Spontanous Breathing and Patient connected to face mask oxygen  Post-op Assessment: Report given to RN and Post -op Vital signs reviewed and stable  Post vital signs: Reviewed and stable  Last Vitals:  Vitals:   01/06/17 0650  BP: (!) 144/80  Pulse: 76  Resp: 16  Temp: 36.7 C    Last Pain:  Vitals:   01/06/17 0650  TempSrc: Oral         Complications: No apparent anesthesia complications

## 2017-01-09 ENCOUNTER — Encounter (HOSPITAL_BASED_OUTPATIENT_CLINIC_OR_DEPARTMENT_OTHER): Payer: Self-pay | Admitting: General Surgery

## 2017-09-19 NOTE — Progress Notes (Signed)
Chief Complaint  Patient presents with  . Annual Exam    fasting annual exam, no pap sees Dr Jake Shark. Needs refill on valtrex. Is tired all the time and has gained some weight-would like TSH checked. Has had a lot of sinus trouble.     Lauren Blanchard is a 47 y.o. female who presents for a complete physical.  She has the following concerns:  PMDD--Dose of zoloft was increased to 172m in October 2014, and further increased to her current 2044mdose in 06/2014. She had been doing well on this medication, at this dose with just mild mood swings and irritability which are cyclical. Recently, however, she reports that she lost motivation to exercise, gained weight, and "that depresses the heck out of me".  Still sees some ups and downs (cyclical), but due to the weight gain, feels depressed more often. (Previously tried Paxil, Prozac. Can't recall what the problems were. Previously reported being  "afraid to rock the boat" and make changes to her medication.) She had anxiety 12/2016 related to breast biopsy, and used alprazolam short-term (day of procedure). Not needing this regularly.  PHQ-2 depression screen 5 today.  See below for PHQ-9 score, and see full screen in epic.  H/o BPV and h/o Meniere's, s/p treatment with neuro PT for BPV flare in the past (1 session).  Ringing in her ears is a little worse since her sinuses having been bothering her more recently. She does also report that she has been eating more [employer] creamy tomato soup (higher sodium diet than her norm).  She has some ear discomfort, intermittent sore throat, drainage.  Nasal rinses doesn't seem to help.  She notices a "slight smell".  Only occasionally gets "some green out".  No fever, some chills. She has allergies, takes flonase and Allegra-D every day.Previously has seen Dr. RoConstance Holsteror her sinuses (not in the last year), but reports she doesn't have f/u scheduled because "he tells me it isn't an infection". Using the  gentamycin only mixed in a nasal saline spray, not sinus irrigation (which is how it was originally recommended, I believe).  IBS with constipation--taking 16 mcg of amitiza at bedtime, along with pericolace. Her bowels are very well controlled on this current regimen.Symptoms (constipation) are always a little worse cyclically (along with breast tenderness).  GERD/h/o epigastric pain: She takes Prevacid (OTC) every day, and denies any recurrent epigastric pain, reflux, or heartburn. She had some mild recurrent symptoms when she missed a pill (only rarely misses a pill). Last year she reported having problems swallowing larger capsules (a particular supplement she was taking, no problems with her smaller meds). No trouble with food, but is more conscious about her eating, eating slower, chewing longer/smaller bites.She has adapted to eating like this, no longer has any problems.  As long as she takes the larger capsules with a large swig of water, she is fine.   Vit D deficiency: She is compliant with taking daily OTC vitamin. Vitamin D-OH level was normal at 52 in 2017 on same regimen.  Hyperlipidemia:  Borderline high LDL, but good HDL in the past. Doesn't eat red meat or eggs.  Some cheese, not daily. +creamy soups recently, her downfall is creamy tomato soup at [employer] Due for recheck today. Lab Results  Component Value Date   CHOL 242 (H) 09/12/2016   HDL 65 09/12/2016   LDLCALC 158 (H) 09/12/2016   TRIG 94 09/12/2016   CHOLHDL 3.7 09/12/2016   Elevated LFT's were noted  last year. She stopped many of her OTC supplements (kept the menopausal one), and repeat LFT's a month later were normal. She realized during today's visit that she is no longer is taking the supplements for menopausal symptoms, and that might be part of her issues lately. She wonders if some of her weight gain is related to hormones.  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 She has a cold sore now, taking old valtrex,  which doesn't seem to be working as well.  Asking for refill of Valtrex.  Immunization History  Administered Date(s) Administered  . Influenza Split 05/24/2012  . Influenza,inj,Quad PF,6+ Mos 05/01/2013  . MMR 06/07/2010, 07/08/2010  . Td 05/24/2010  . Tdap 09/03/2014   Refuses flu shot Last Pap smear: 10/2016, benign, no high risk HPV present Last mammogram: 10/2016, needle core biopsy in 10/2016 showed: [place]; biopsy/lumpectomy in June 2018. Pathology report: Breast, lumpectomy, Right w/seed - COMPLEX SCLEROSING LESION WITH USUAL DUCTAL HYPERPLASIA AND CALCIFICATIONS. - BENIGN DUCTAL PAPILLOMA. - FIBROCYSTIC CHANGES WITH CALCIFICATIONS AND USUAL DUCTAL HYPERPLASIA. - PREVIOUS BIOPSY SITE. - NO EVIDENCE OF MALIGNANCY. 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 Currently not getting regular exercise otherwise.  Past Medical History:  Diagnosis Date  . Abnormal mammogram of right breast 01/06/2017  . Epigastric pain March,2014   Takes Prevacid (pain resolved)  . Hemorrhagic ovarian cyst 08/2015   right  . IBS (irritable bowel syndrome)    constipation predominant  . Iron deficiency anemia 2014  . Meniere disease 2001   chronic tinnitus; no vertigo since limiting sodium intake  . PMDD (premenstrual dysphoric disorder)    takes Zoloft for this  . PONV (postoperative nausea and vomiting)    has had PONV with most of her past surgeries  . Seasonal allergies     Past Surgical History:  Procedure Laterality Date  . BILATERAL SALPINGECTOMY Bilateral 01/17/2013   Procedure: BILATERAL SALPINGECTOMY;  Surgeon: Allyn Kenner, DO;  Location: [facility];  Service: Gynecology;  Laterality: Bilateral;  . BREAST LUMPECTOMY WITH RADIOACTIVE SEED LOCALIZATION Right 01/06/2017   Procedure: RIGHT BREAST LUMPECTOMY  WITH RADIOACTIVE SEED LOCALIZATION;  Surgeon: Fanny Skates, MD;  Location: [facility];  Service: General;  Laterality: Right;  . CESAREAN SECTION     x2: 2001 and 2004  . CYSTOSCOPY  01/17/2013   Procedure: CYSTOSCOPY;  Surgeon: Allyn Kenner, DO;  Location: [facility];  Service: Gynecology;;  . LEEP  1997  . MANDIBLE SURGERY  2007  . NASAL SINUS SURGERY     x3  . OTHER SURGICAL HISTORY  2006  . ROBOTIC ASSISTED TOTAL HYSTERECTOMY N/A 01/17/2013   Procedure: ROBOTIC ASSISTED TOTAL HYSTERECTOMY;  Surgeon: Allyn Kenner, DO;  Location: [facility];  Service: Gynecology;  Laterality: N/A;  . TUBAL LIGATION  2004  . WISDOM TOOTH EXTRACTION      Social History   Socioeconomic History  . Marital status: Married    Spouse name: Not on file  . Number of children: 3  . Years of education: Not on file  . Highest education level: Not on file  Social Needs  . Financial resource strain: Not on file  . Food insecurity - worry: Not on file  . Food insecurity - inability: Not on file  . Transportation needs - medical: Not on file  . Transportation needs - non-medical:  Not on file  Occupational History  . Occupation: Insurance claims handler: [employer]  Tobacco Use  . Smoking status: Never Smoker  . Smokeless tobacco: Never Used  Substance and Sexual Activity  . Alcohol use: No  . Drug use: No  . Sexual activity: Yes    Partners: Male    Birth control/protection: Surgical  Other Topics Concern  . Not on file  Social History Narrative   Divorced and re-married.  Lives with 3 children, husband.  Pet hedgehog and a Malti-poo puppy (2018), turtle.  No tobacco exposure.   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    Family History  Problem Relation Age of Onset  . Diabetes Mother   . Hypertension Mother   . Irritable bowel syndrome Mother   . Alzheimer's disease Father 74  . Hypertension Maternal  Grandmother   . Hyperlipidemia Maternal Grandmother   . Stroke Maternal Grandfather   . Diabetes Maternal Grandfather   . Diabetes Paternal Grandmother   . Alzheimer's disease Paternal Grandmother   . Heart disease Paternal Grandfather 38       died of MI  . Turner syndrome Daughter   . Cancer Neg Hx     Outpatient Encounter Medications as of 09/20/2017  Medication Sig Note  . Ascorbic Acid (VITAMIN C) 1000 MG tablet Take 1,000 mg by mouth daily.   . cholecalciferol (VITAMIN D) 1000 UNITS tablet Take 1,000 Units by mouth daily.   Marland Kitchen conjugated estrogens (PREMARIN) vaginal cream Place 1 Applicatorful vaginally daily.   . fexofenadine-pseudoephedrine (ALLEGRA-D 24) 180-240 MG per 24 hr tablet Take 1 tablet by mouth daily.   . fluticasone (FLONASE) 50 MCG/ACT nasal spray Place 2 sprays into the nose daily.   Marland Kitchen gentamicin (GARAMYCIN) 40 MG/ML injection Place 40 mg into the nose as needed. Reported on 09/09/2015 09/09/2015: Uses via irrigation prn purulent nasal drainage  . ibuprofen (ADVIL,MOTRIN) 200 MG tablet Take 400-600 mg by mouth every 6 (six) hours as needed for pain. Reported on 09/09/2015   . lansoprazole (PREVACID) 15 MG capsule Take 15 mg by mouth daily.   Marland Kitchen lubiprostone (AMITIZA) 8 MCG capsule Take 2 capsules (16 mcg total) by mouth at bedtime. TAKE ONE CAPSULE BY MOUTH TWICE DAILY WITH A MEAL   . naphazoline-pheniramine (NAPHCON-A) 0.025-0.3 % ophthalmic solution Place 1 drop into both eyes daily.   . sennosides-docusate sodium (SENOKOT-S) 8.6-50 MG tablet Take 1 tablet by mouth daily.   . sertraline (ZOLOFT) 100 MG tablet Take 2 tablets (200 mg total) by mouth at bedtime.   . vitamin B-12 (CYANOCOBALAMIN) 1000 MCG tablet Take 1,000 mcg by mouth daily.   . vitamin E (VITAMIN E) 1000 UNIT capsule Take 1,000 Units by mouth daily.   . Lactobacillus Rhamnosus, GG, (CULTURELLE PO) Take 1 capsule by mouth daily.   . [DISCONTINUED] acetaminophen (TYLENOL) 500 MG tablet Take 1,000 mg by mouth  every 6 (six) hours as needed. Reported on 09/09/2015   . [DISCONTINUED] ALPRAZolam (XANAX) 0.25 MG tablet Take 1-2 tablets (0.25-0.5 mg total) by mouth every 8 (eight) hours as needed for anxiety.   . [DISCONTINUED] HYDROcodone-acetaminophen (NORCO) 5-325 MG tablet Take 1-2 tablets by mouth every 6 (six) hours as needed for moderate pain or severe pain.    No facility-administered encounter medications on file as of 09/20/2017.     Allergies  Allergen Reactions  . Sulfa Antibiotics Rash    ROS: The patient denies anorexia,  fever, headaches (just occasional sinus headaches), decreased hearing, breast concerns, chest pain, palpitations, dizziness, syncope, dyspnea on exertion, cough, swelling, vomiting, diarrhea, melena, hematochezia, hematuria, dysuria, vaginal discharge, odor or itch, genital lesions, numbness, tingling, weakness, tremor, suspicious skin lesions, abnormal bleeding/bruising, or enlarged lymph nodes.  Occasional nausea.   Some postnasal drainage and sinus pressure per HPI. Mild urinary urgency, rare incontinence. + constipation, chronic, controlled Chronic tinnitus (both ears), a little worse recently; some trouble hearing low pitches Hot flashes/night sweats improved with supplements; recurred since she stopped taking them. Some intermittent pain in her knees. Chronic tension in right neck and shoulder, sees chiro regularly 23# weight gain in the last year   PHYSICAL EXAM:  BP 134/80   Pulse 80   Temp 97.6 F (36.4 C) (Tympanic)   Ht _0  (1.676 m)   Wt 179 lb 6.4 oz (81.4 kg)   LMP 01/10/2013   BMI 28.96 kg/m    Wt Readings from Last 3 Encounters:  09/20/17 179 lb 6.4 oz (81.4 kg)  01/06/17 160 lb (72.6 kg)  09/12/16 156 lb (70.8 kg)    General Appearance:  Alert, cooperative, no distress, appears stated age   Head:  Normocephalic, without obvious abnormality, atraumatic   Eyes:  PERRL, conjunctiva/corneas clear, EOM's intact, fundi benign    Ears:  Normal TM's and external ear canals   Nose:  Nares normal, mucosa is moderately edematous with some distal crusting; no erythema or purulence. No sinus tenderness   Throat:  Lips, mucosa, and tongue normal; teeth and gums normal. Large cold sore on the right side of lower lip, not yet scabbed  Neck:  Supple, no lymphadenopathy; thyroid: no enlargement/tenderness/nodules; no carotid bruit or JVD   Back:  Spine nontender, no curvature, ROM normal, no CVA tenderness   Lungs:  Clear to auscultation bilaterally without wheezes, rales or ronchi; respirations unlabored   Chest Wall:  No tenderness or deformity   Heart:  Regular rate and rhythm, S1 and S2 normal, no murmur, rub or gallop   Breast Exam:  Deferred to GYN   Abdomen:  Soft, nontender, nondistended, normoactive bowel sounds, no masses, no hepatosplenomegaly.  Genitalia:  Deferred to GYN      Extremities:  No clubbing, cyanosis or edema   Pulses:  2+ and symmetric all extremities   Skin:  Skin color, texture, turgor normal, no rashes  Lymph nodes:  Cervical, supraclavicular nodes normal   Neurologic:  CNII-XII intact, normal strength, sensation and gait; reflexes 2+ and symmetric throughout   Psych: Normal hygiene and grooming. Appeared somewhat depressed and fatigued. Normal eye contact and speech.  PHQ-9 score of 8   ASSESSMENT/PLAN:  Annual physical exam - Plan: Visual acuity screening, POCT Urinalysis DIP (Proadvantage Device)  PMDD (premenstrual dysphoric disorder) - with worsening moods related to weight gain. Refer to nutritionist, counseled re: diet, exercise. Continue sertraline - Plan: sertraline (ZOLOFT) 100 MG tablet  Irritable bowel syndrome with constipation - well controlled on amitiza and stool softeners - Plan: lubiprostone (AMITIZA) 8 MCG capsule  Vitamin D deficiency - Due for recheck; continue supplements - Plan: VITAMIN D  25 Hydroxy (Vit-D Deficiency, Fractures)  Hypercholesteremia - low cholesterol diet reviewed in detail--suspect will be worse. Refer to dietician - Plan: Lipid panel, Amb ref to Medical Nutrition Therapy-MNT  Elevated LFTs - Plan: Comprehensive metabolic panel  Fatigue, unspecified type - Plan: Comprehensive metabolic panel, CBC with Differential/Platelet, TSH, VITAMIN D 25 Hydroxy (Vit-D Deficiency, Fractures)  Weight gain - Plan: TSH,  Amb ref to Medical Nutrition Therapy-MNT  Herpes labialis - refilled valtrex, instructed on proper use - Plan: valACYclovir (VALTREX) 1000 MG tablet  Tinnitus, unspecified laterality - with h/o Meniere's; suspect higher sodium intake is contributing; Low sodium diet reviewed  Allergic rhinitis, unspecified seasonality, unspecified trigger - and h/o sinus issues; cont antihistamine and flonase; resume sinus rinses with gent (not just spray); f/u with Dr. Constance Holster prn   CBC,, c-met (elevated LFT's last year), lipid, TSH Recheck Vit D (nl 2017, diff supps??)  High salt intake from soups likely worsening Meniere's. Low sodium diet reviewed.  Urge incontinence--counseled re: behavioral techniques  REFER TO DIETICIAN--chol, weight loss Weight gain--counseled re: diet, exercise  Discussed monthly self breast exams and yearly mammograms; at least 30 minutes of aerobic activity at least 5 days/week, weight-bearing exercise 2-3x/week; proper sunscreen use reviewed; healthy diet, including goals of calcium and vitamin D intake and alcohol recommendations (less than or equal to 1 drink/day) reviewed; regular seatbelt use; changing batteries in smoke detectors. Immunization recommendations discussed-- Yearly flu shots are recommended (declined); Shingrix age 51. Colonoscopy recommendations reviewed--age 50.    F/u 3 mos on moods, weight. Recheck labs then, if needed (or arrange for it)

## 2017-09-20 ENCOUNTER — Ambulatory Visit (INDEPENDENT_AMBULATORY_CARE_PROVIDER_SITE_OTHER): Admitting: Family Medicine

## 2017-09-20 ENCOUNTER — Encounter: Payer: Self-pay | Admitting: Family Medicine

## 2017-09-20 VITALS — BP 134/80 | HR 80 | Temp 97.6°F | Ht 66.0 in | Wt 179.4 lb

## 2017-09-20 DIAGNOSIS — J309 Allergic rhinitis, unspecified: Secondary | ICD-10-CM

## 2017-09-20 DIAGNOSIS — H9319 Tinnitus, unspecified ear: Secondary | ICD-10-CM | POA: Diagnosis not present

## 2017-09-20 DIAGNOSIS — Z0000 Encounter for general adult medical examination without abnormal findings: Secondary | ICD-10-CM

## 2017-09-20 DIAGNOSIS — E7800 Pure hypercholesterolemia, unspecified: Secondary | ICD-10-CM

## 2017-09-20 DIAGNOSIS — F3281 Premenstrual dysphoric disorder: Secondary | ICD-10-CM

## 2017-09-20 DIAGNOSIS — E559 Vitamin D deficiency, unspecified: Secondary | ICD-10-CM

## 2017-09-20 DIAGNOSIS — R5383 Other fatigue: Secondary | ICD-10-CM

## 2017-09-20 DIAGNOSIS — K581 Irritable bowel syndrome with constipation: Secondary | ICD-10-CM

## 2017-09-20 DIAGNOSIS — R7989 Other specified abnormal findings of blood chemistry: Secondary | ICD-10-CM

## 2017-09-20 DIAGNOSIS — R635 Abnormal weight gain: Secondary | ICD-10-CM

## 2017-09-20 DIAGNOSIS — B001 Herpesviral vesicular dermatitis: Secondary | ICD-10-CM

## 2017-09-20 DIAGNOSIS — R945 Abnormal results of liver function studies: Secondary | ICD-10-CM | POA: Diagnosis not present

## 2017-09-20 LAB — POCT URINALYSIS DIP (PROADVANTAGE DEVICE)
BILIRUBIN UA: NEGATIVE
Blood, UA: NEGATIVE
Glucose, UA: NEGATIVE mg/dL
Ketones, POC UA: NEGATIVE mg/dL
LEUKOCYTES UA: NEGATIVE
NITRITE UA: NEGATIVE
PH UA: 6 (ref 5.0–8.0)
PROTEIN UA: NEGATIVE mg/dL
Specific Gravity, Urine: 1.03
Urobilinogen, Ur: NEGATIVE

## 2017-09-20 MED ORDER — VALACYCLOVIR HCL 1 G PO TABS: ORAL_TABLET | ORAL | 3 refills | Status: DC

## 2017-09-20 MED ORDER — SERTRALINE HCL 100 MG PO TABS: 200.0000 mg | ORAL_TABLET | Freq: Every day | ORAL | 3 refills | Status: DC

## 2017-09-20 MED ORDER — LUBIPROSTONE 8 MCG PO CAPS: 16.0000 ug | ORAL_CAPSULE | Freq: Every day | ORAL | 3 refills | Status: DC

## 2017-09-20 NOTE — Patient Instructions (Addendum)
HEALTH MAINTENANCE RECOMMENDATIONS:  It is recommended that you get at least 30 minutes of aerobic exercise at least 5 days/week (for weight loss, you may need as much as 60-90 minutes). This can be any activity that gets your heart rate up. This can be divided in 10-15 minute intervals if needed, but try and build up your endurance at least once a week.  Weight bearing exercise is also recommended twice weekly.  Eat a healthy diet with lots of vegetables, fruits and fiber.  "Colorful" foods have a lot of vitamins (ie green vegetables, tomatoes, red peppers, etc).  Limit sweet tea, regular sodas and alcoholic beverages, all of which has a lot of calories and sugar.  Up to 1 alcoholic drink daily may be beneficial for women (unless trying to lose weight, watch sugars).  Drink a lot of water.  Calcium recommendations are 1200-1500 mg daily (1500 mg for postmenopausal women or women without ovaries), and vitamin D 1000 IU daily.  This should be obtained from diet and/or supplements (vitamins), and calcium should not be taken all at once, but in divided doses.  Monthly self breast exams and yearly mammograms for women over the age of 77 is recommended.  Sunscreen of at least SPF 30 should be used on all sun-exposed parts of the skin when outside between the hours of 10 am and 4 pm (not just when at beach or pool, but even with exercise, golf, tennis, and yard work!)  Use a sunscreen that says "broad spectrum" so it covers both UVA and UVB rays, and make sure to reapply every 1-2 hours.  Remember to change the batteries in your smoke detectors when changing your clock times in the spring and fall.  Use your seat belt every time you are in a car, and please drive safely and not be distracted with cell phones and texting while driving.  Your tinnitus may be worse due to higher sodium intake from the soups.  See below for low sodium diet.  I recommend following up with Dr. Constance Holster for your sinuses. In the  interim, change the way you are using the gentamycin (sinus rinses, not nasal spray).  We are referring you to a nutritionist, and hope that you can resume more regular exercise.  Resume the supplement you previously used for your menopausal symptoms. Discuss these symptoms with your GYN next month if not improving.    Low-Sodium Eating Plan Sodium, which is an element that makes up salt, helps you maintain a healthy balance of fluids in your body. Too much sodium can increase your blood pressure and cause fluid and waste to be held in your body. Your health care provider or dietitian may recommend following this plan if you have high blood pressure (hypertension), kidney disease, liver disease, or heart failure. Eating less sodium can help lower your blood pressure, reduce swelling, and protect your heart, liver, and kidneys. What are tips for following this plan? General guidelines  Most people on this plan should limit their sodium intake to 1,500-2,000 mg (milligrams) of sodium each day. Reading food labels  The Nutrition Facts label lists the amount of sodium in one serving of the food. If you eat more than one serving, you must multiply the listed amount of sodium by the number of servings.  Choose foods with less than 140 mg of sodium per serving.  Avoid foods with 300 mg of sodium or more per serving. Shopping  Look for lower-sodium products, often labeled as "low-sodium" or "  no salt added."  Always check the sodium content even if foods are labeled as "unsalted" or "no salt added".  Buy fresh foods. ? Avoid canned foods and premade or frozen meals. ? Avoid canned, cured, or processed meats  Buy breads that have less than 80 mg of sodium per slice. Cooking  Eat more home-cooked food and less restaurant, buffet, and fast food.  Avoid adding salt when cooking. Use salt-free seasonings or herbs instead of table salt or sea salt. Check with your health care provider or  pharmacist before using salt substitutes.  Cook with plant-based oils, such as canola, sunflower, or olive oil. Meal planning  When eating at a restaurant, ask that your food be prepared with less salt or no salt, if possible.  Avoid foods that contain MSG (monosodium glutamate). MSG is sometimes added to [place] food, bouillon, and some canned foods. What foods are recommended? The items listed may not be a complete list. Talk with your dietitian about what dietary choices are best for you. Grains Low-sodium cereals, including oats, puffed wheat and rice, and shredded wheat. Low-sodium crackers. Unsalted rice. Unsalted pasta. Low-sodium bread. Whole-grain breads and whole-grain pasta. Vegetables Fresh or frozen vegetables. "No salt added" canned vegetables. "No salt added" tomato sauce and paste. Low-sodium or reduced-sodium tomato and vegetable juice. Fruits Fresh, frozen, or canned fruit. Fruit juice. Meats and other protein foods Fresh or frozen (no salt added) meat, poultry, seafood, and fish. Low-sodium canned tuna and salmon. Unsalted nuts. Dried peas, beans, and lentils without added salt. Unsalted canned beans. Eggs. Unsalted nut butters. Dairy Milk. Soy milk. Cheese that is naturally low in sodium, such as ricotta cheese, fresh mozzarella, or Swiss cheese Low-sodium or reduced-sodium cheese. Cream cheese. Yogurt. Fats and oils Unsalted butter. Unsalted margarine with no trans fat. Vegetable oils such as canola or olive oils. Seasonings and other foods Fresh and dried herbs and spices. Salt-free seasonings. Low-sodium mustard and ketchup. Sodium-free salad dressing. Sodium-free light mayonnaise. Fresh or refrigerated horseradish. Lemon juice. Vinegar. Homemade, reduced-sodium, or low-sodium soups. Unsalted popcorn and pretzels. Low-salt or salt-free chips. What foods are not recommended? The items listed may not be a complete list. Talk with your dietitian about what dietary choices  are best for you. Grains Instant hot cereals. Bread stuffing, pancake, and biscuit mixes. Croutons. Seasoned rice or pasta mixes. Noodle soup cups. Boxed or frozen macaroni and cheese. Regular salted crackers. Self-rising flour. Vegetables Sauerkraut, pickled vegetables, and relishes. Olives. [place] fries. Onion rings. Regular canned vegetables (not low-sodium or reduced-sodium). Regular canned tomato sauce and paste (not low-sodium or reduced-sodium). Regular tomato and vegetable juice (not low-sodium or reduced-sodium). Frozen vegetables in sauces. Meats and other protein foods Meat or fish that is salted, canned, smoked, spiced, or pickled. Bacon, ham, sausage, hotdogs, corned beef, chipped beef, packaged lunch meats, salt pork, jerky, pickled herring, anchovies, regular canned tuna, sardines, salted nuts. Dairy Processed cheese and cheese spreads. Cheese curds. Blue cheese. Feta cheese. String cheese. Regular cottage cheese. Buttermilk. Canned milk. Fats and oils Salted butter. Regular margarine. Ghee. Bacon fat. Seasonings and other foods Onion salt, garlic salt, seasoned salt, table salt, and sea salt. Canned and packaged gravies. Worcestershire sauce. Tartar sauce. Barbecue sauce. Teriyaki sauce. Soy sauce, including reduced-sodium. Steak sauce. Fish sauce. Oyster sauce. Cocktail sauce. Horseradish that you find on the shelf. Regular ketchup and mustard. Meat flavorings and tenderizers. Bouillon cubes. Hot sauce and Tabasco sauce. Premade or packaged marinades. Premade or packaged taco seasonings. Relishes. Regular salad dressings.  Salsa. Potato and tortilla chips. Corn chips and puffs. Salted popcorn and pretzels. Canned or dried soups. Pizza. Frozen entrees and pot pies. Summary  Eating less sodium can help lower your blood pressure, reduce swelling, and protect your heart, liver, and kidneys.  Most people on this plan should limit their sodium intake to 1,500-2,000 mg (milligrams) of  sodium each day.  Canned, boxed, and frozen foods are high in sodium. Restaurant foods, fast foods, and pizza are also very high in sodium. You also get sodium by adding salt to food.  Try to cook at home, eat more fresh fruits and vegetables, and eat less fast food, canned, processed, or prepared foods. This information is not intended to replace advice given to you by your health care provider. Make sure you discuss any questions you have with your health care provider. Document Released: 12/17/2001 Document Revised: 06/20/2016 Document Reviewed: 06/20/2016 Elsevier Interactive Patient Education  [employer].

## 2017-09-22 LAB — COMPREHENSIVE METABOLIC PANEL
ALT: 12 IU/L (ref 0–32)
AST: 14 IU/L (ref 0–40)
Albumin/Globulin Ratio: 1.7 (ref 1.2–2.2)
Albumin: 4.6 g/dL (ref 3.5–5.5)
Alkaline Phosphatase: 78 IU/L (ref 39–117)
BUN/Creatinine Ratio: 22 (ref 9–23)
BUN: 14 mg/dL (ref 6–24)
Bilirubin Total: <0.2 mg/dL (ref 0.0–1.2)
CALCIUM: 9.3 mg/dL (ref 8.7–10.2)
CO2: 19 mmol/L — AB (ref 20–29)
CREATININE: 0.63 mg/dL (ref 0.57–1.00)
Chloride: 105 mmol/L (ref 96–106)
GFR, EST AFRICAN AMERICAN: 123 mL/min/{1.73_m2} (ref >59)
GFR, EST NON AFRICAN AMERICAN: 106 mL/min/{1.73_m2} (ref >59)
Globulin, Total: 2.7 g/dL (ref 1.5–4.5)
Glucose: 86 mg/dL (ref 65–99)
Potassium: 4.1 mmol/L (ref 3.5–5.2)
Sodium: 142 mmol/L (ref 134–144)
TOTAL PROTEIN: 7.3 g/dL (ref 6.0–8.5)

## 2017-09-22 LAB — CBC WITH DIFFERENTIAL/PLATELET
BASOS: 1 %
Basophils Absolute: 0 10*3/uL (ref 0.0–0.2)
EOS (ABSOLUTE): 0.2 10*3/uL (ref 0.0–0.4)
Eos: 3 %
Hematocrit: 40.2 % (ref 34.0–46.6)
Hemoglobin: 13.2 g/dL (ref 11.1–15.9)
IMMATURE GRANS (ABS): 0 10*3/uL (ref 0.0–0.1)
Immature Granulocytes: 0 %
LYMPHS ABS: 2 10*3/uL (ref 0.7–3.1)
LYMPHS: 35 %
MCH: 29.5 pg (ref 26.6–33.0)
MCHC: 32.8 g/dL (ref 31.5–35.7)
MCV: 90 fL (ref 79–97)
Monocytes Absolute: 0.6 10*3/uL (ref 0.1–0.9)
Monocytes: 11 %
NEUTROS ABS: 2.9 10*3/uL (ref 1.4–7.0)
Neutrophils: 50 %
PLATELETS: 368 10*3/uL (ref 150–379)
RBC: 4.47 x10E6/uL (ref 3.77–5.28)
RDW: 13.9 % (ref 12.3–15.4)
WBC: 5.8 10*3/uL (ref 3.4–10.8)

## 2017-09-22 LAB — LIPID PANEL
CHOL/HDL RATIO: 4.7 ratio — AB (ref 0.0–4.4)
Cholesterol, Total: 283 mg/dL — ABNORMAL HIGH (ref 100–199)
HDL: 60 mg/dL (ref >39)
LDL Calculated: 179 mg/dL — ABNORMAL HIGH (ref 0–99)
TRIGLYCERIDES: 220 mg/dL — AB (ref 0–149)
VLDL CHOLESTEROL CAL: 44 mg/dL — AB (ref 5–40)

## 2017-09-22 LAB — VITAMIN D 25 HYDROXY (VIT D DEFICIENCY, FRACTURES): Vit D, 25-Hydroxy: 43.7 ng/mL (ref 30.0–100.0)

## 2017-09-22 LAB — TSH: TSH: 1.4 u[IU]/mL (ref 0.450–4.500)

## 2017-10-24 ENCOUNTER — Encounter: Payer: Self-pay | Admitting: Family Medicine

## 2017-10-25 ENCOUNTER — Other Ambulatory Visit: Payer: Self-pay | Admitting: *Deleted

## 2017-10-25 DIAGNOSIS — K581 Irritable bowel syndrome with constipation: Secondary | ICD-10-CM

## 2017-10-25 MED ORDER — LUBIPROSTONE 8 MCG PO CAPS: 8.0000 ug | ORAL_CAPSULE | Freq: Two times a day (BID) | ORAL | 3 refills | Status: DC

## 2017-11-01 LAB — HM MAMMOGRAPHY

## 2017-11-17 ENCOUNTER — Telehealth: Payer: Self-pay | Admitting: Family Medicine

## 2017-11-17 NOTE — Telephone Encounter (Signed)
Received requested Mammogram from [facility]. Sending back for review.

## 2017-11-20 ENCOUNTER — Encounter: Payer: Self-pay | Admitting: *Deleted

## 2017-12-25 ENCOUNTER — Encounter: Admitting: Family Medicine

## 2018-09-24 ENCOUNTER — Encounter: Admitting: Family Medicine

## 2018-10-31 ENCOUNTER — Other Ambulatory Visit: Payer: Self-pay | Admitting: Family Medicine

## 2018-10-31 DIAGNOSIS — K581 Irritable bowel syndrome with constipation: Secondary | ICD-10-CM

## 2018-11-01 NOTE — Telephone Encounter (Signed)
Is this okay to refill or does she need appt?

## 2018-11-01 NOTE — Telephone Encounter (Signed)
Looks like she rescheduled her CPE from March to December.  Is there anything sooner for a CPE? Okay to refill until CPE, but prefer sooner.

## 2018-11-01 NOTE — Telephone Encounter (Signed)
No sooner am appts, she is already on cx list.

## 2018-12-19 LAB — HM MAMMOGRAPHY

## 2019-01-15 ENCOUNTER — Telehealth: Payer: Self-pay | Admitting: Family Medicine

## 2019-01-15 NOTE — Telephone Encounter (Signed)
Received request records from [facility].

## 2019-02-03 ENCOUNTER — Telehealth: Payer: Self-pay | Admitting: Family Medicine

## 2019-02-03 NOTE — Telephone Encounter (Signed)
Recv'd notice for P.A. Amitiza, completed P.A. and states Clinical override not needed, will call pharmacy tomorrow

## 2019-02-07 NOTE — Telephone Encounter (Signed)
Called [facility] and still not going thru and Cover My meds still not letting me complete online.   Called t#  [phone] [facility] ([employer]) completed P.A. over the phone & was approved til 02/07/20 case [id number].  Called pt & informed

## 2019-02-18 ENCOUNTER — Telehealth: Payer: Self-pay | Admitting: Family Medicine

## 2019-02-18 NOTE — Telephone Encounter (Signed)
Pt called & states still hasn't gotten her Amitiza thru mail order and she called and is going to be another 3-5 days.  She asked that we call in 10 pills to [facility].  I called and #10 pills will cost $33 same as 30 days worth.  Pt is just going to pick up samples, ok per Dr. Tomi Bamberger.  She has CPE scheduled 06/26/19.

## 2019-05-14 NOTE — Progress Notes (Signed)
Chief Complaint  Patient presents with  . Annual Exam    fasting annual exam, no pap-sees GYN. Did not want to do eye exam, will see eye doctor if she needs she said. No new concerns.   . Flu Vaccine    declined.   Lauren Blanchard is a 48 y.o. female who presents for a complete physical.  She has the following concerns:  H/o kyphosis in her family.  She struggles with her posture and has questions. 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  Does some exercises.  Has had for a while, just starting to get concerned, not wanting to end up hunched over.  H/o PMDD--She had been on 248m of zoloft since 06/2014 (titrated up from lower dose).  At her visit last year she noted decreased motivation to exercise, and had gained a lot of weight, which made her even more depressed.  PHQ-9 score was 8.  Her cholesterol was also up (see below). She was referred to dietician and was supposed to f/u in 3 months. She never did set this up. She wasn't sure how much hormones played into things (hadn't been taking any supplements, stopped them when LFT's were elevated, and also related to an ovarian cyst which ruptured). She since saw her GYN, was started on estradiol patch (and intermittent vaginal cream), and is feeling much better.  Last GYN exam was in June.  She feels "better than normal".  She weaned herself off of the Zoloft when she started the patches, earlier this year (cut by 557mevery 2 weeks).  Still gets breast tenderness periodically (not as regularly), and does find some mood changes around that time, but mild.  Getting regular exercise helps her immensely, doesn't feel like she needs to be on the medications to control the mood changes.  H/oBPV and h/o Meniere's, s/p treatment withneuro PTfor BPV flare in the past (1 session).  She gets mild vertigo just when sinuses flare. She has h/o allergies and sinuses infections. She has more problems with season/temperature changes.  She  is under the care of ENT, last saw Dr. MoLaurance Flattenn August 2020.  She takes Flonase and Allegra-D daily. She is using gentamicin sinus wash through [facility]. She is currently doing saline sinus rinses once every 1-2 days ,using the gentamycin just once a week  (waiting on refill, so has been out for a couple of weeks).  IBS with constipation--taking 16 mcg of amitiza at bedtime, along with pericolace. Her bowels are controlled on this current regimen.  GERD/h/o epigastric pain: She takes Prevacid (OTC) every day, and denies any recurrent epigastric pain, reflux, or heartburn. She had some mild recurrent symptoms when she missed a dose in the past. She hasn't missed any in a while.  She has lost weight, and avoids foods which trigger pain (breads, fried foods). Previously reported some problems with swallowing large pills, but does okay as long as she takes larger pills/capsules with a lot of water.  Denies any dysphagia with food or liquids.  Vit D deficiency: She is compliant with taking daily OTC vitamin. Vitamin D-OH level was normal 09/2017 and in 2017 on same regimen.  She continues on same supplements.  Hyperlipidemia: Borderline high LDL in the past, worse last year.  HDL has been good.  LDL increased from 158 to 179 last year, had been having creamy tomato soup at [place] has improved.  Eating less, has lost weight. She doesn't eat red meat or  eggs, some cheese (not every day).  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 Uses Valtrex prn.  She is requesting a refill.  She takes it as soon as she feels the tingle, and never actually gets a  cold sore.   Immunization History  Administered Date(s) Administered  . Influenza Split 05/24/2012  . Influenza,inj,Quad PF,6+ Mos 05/01/2013  . MMR 06/07/2010, 07/08/2010  . Td 05/24/2010  . Tdap 09/03/2014   Refuses flu shot Last Pap smear: 10/2016, benign, no high risk HPV present Last mammogram:12/2018 Last colonoscopy: 1994 (as part of eval for IBS)  Last DEXA: never  Dentist: every 6 months  Ophtho:yearly Exercise:She started exercising regularly 5 days/week when not working related to the pandemic.  She cut back to 3x/week since back at work. 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  Past Medical History:  Diagnosis Date  . Abnormal mammogram of right breast 01/06/2017  . Epigastric pain March,2014   Takes Prevacid (pain resolved)  . Hemorrhagic ovarian cyst 08/2015   right  . IBS (irritable bowel syndrome)    constipation predominant  . Iron deficiency anemia 2014  . Meniere disease 2001   chronic tinnitus; no vertigo since limiting sodium intake  . PMDD (premenstrual dysphoric disorder)    takes Zoloft for this  . PONV (postoperative nausea and vomiting)    has had PONV with most of her past surgeries  . Seasonal allergies     Past Surgical History:  Procedure Laterality Date  . BILATERAL SALPINGECTOMY Bilateral 01/17/2013   Procedure: BILATERAL SALPINGECTOMY;  Surgeon: Allyn Kenner, DO;  Location: [facility];  Service: Gynecology;  Laterality: Bilateral;  . BREAST LUMPECTOMY WITH RADIOACTIVE SEED LOCALIZATION Right 01/06/2017   Procedure: RIGHT BREAST LUMPECTOMY WITH RADIOACTIVE SEED LOCALIZATION;  Surgeon: Fanny Skates, MD;  Location: [facility];  Service: General;  Laterality: Right;  . CESAREAN SECTION     x2: 2001 and 2004  . CYSTOSCOPY  01/17/2013   Procedure: CYSTOSCOPY;  Surgeon: Allyn Kenner, DO;  Location: [facility];  Service: Gynecology;;  . LEEP  1997  . MANDIBLE SURGERY  2007   . NASAL SINUS SURGERY     x3  . OTHER SURGICAL HISTORY  2006  . ROBOTIC ASSISTED TOTAL HYSTERECTOMY N/A 01/17/2013   Procedure: ROBOTIC ASSISTED TOTAL HYSTERECTOMY;  Surgeon: Allyn Kenner, DO;  Location: [facility];  Service: Gynecology;  Laterality: N/A;  . TUBAL LIGATION  2004  . WISDOM TOOTH EXTRACTION      Social History   Socioeconomic History  . Marital status: Married    Spouse name: Not on file  . Number of children: 3  . Years of education: Not on file  . Highest education level: Not on file  Occupational History  . Occupation: Insurance claims handler: [employer]  . Financial resource strain: Not on file  . Food insecurity    Worry: Not on file    Inability: Not on file  .  Transportation needs    Medical: Not on file    Non-medical: Not on file  Tobacco Use  . Smoking status: Never Smoker  . Smokeless tobacco: Never Used  Substance and Sexual Activity  . Alcohol use: No  . Drug use: No  . Sexual activity: Yes    Partners: Male    Birth control/protection: Surgical  Lifestyle  . Physical activity    Days per week: Not on file    Minutes per session: Not on file  . Stress: Not on file  Relationships  . Social Herbalist on phone: Not on file    Gets together: Not on file    Attends religious service: Not on file    Active member of club or organization: Not on file    Attends meetings of clubs or organizations: Not on file    Relationship status: Not on file  . Intimate partner violence    Fear of current or ex partner: Not on file    Emotionally abused: Not on file    Physically abused: Not on file    Forced sexual activity: Not on file  Other Topics Concern  . Not on file  Social History Narrative   Divorced and re-married.  Lives with 3 children, husband.  Pet hedgehog and a Malti-poo  (2018), turtle.  No tobacco exposure.   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    Family History  Problem Relation Age of Onset  . Diabetes Mother   . Hypertension Mother   . Irritable bowel syndrome Mother   . Alzheimer's disease Father 50  . Hypertension Maternal Grandmother   . Hyperlipidemia Maternal Grandmother   . Stroke Maternal Grandfather   . Diabetes Maternal Grandfather   . Diabetes Paternal Grandmother   . Alzheimer's disease Paternal Grandmother   . Heart disease Paternal Grandfather 50       died of MI  . Turner syndrome Daughter   . Cancer Neg Hx     Outpatient Encounter Medications as of 05/15/2019  Medication Sig Note  . AMITIZA 8 MCG capsule TAKE 1 CAPSULE TWICE A DAY WITH MEALS   . Ascorbic Acid (VITAMIN C) 1000 MG tablet Take 1,000 mg by mouth daily.   . cholecalciferol (VITAMIN D) 1000 UNITS tablet Take 1,000 Units by mouth daily.   Marland Kitchen conjugated estrogens (PREMARIN) vaginal cream Place 1 Applicatorful vaginally daily. 05/15/2019: Uses once a week  . fexofenadine-pseudoephedrine (ALLEGRA-D 24) 180-240 MG per 24 hr tablet Take 1 tablet by mouth daily.   . fluticasone (FLONASE) 50 MCG/ACT nasal spray Place 2 sprays into the nose daily.   Marland Kitchen gentamicin (GARAMYCIN) 40 MG/ML injection Place 40 mg into the nose as needed. Reported on 09/09/2015 05/15/2019: Does irrigations once a week; ran out 2 weeks ago, waiting on refill  . lansoprazole (PREVACID) 15 MG capsule Take 15 mg by mouth daily.   Marland Kitchen pyridOXINE (VITAMIN B-6) 100 MG tablet Take 100 mg by mouth daily.   . sennosides-docusate sodium (SENOKOT-S) 8.6-50 MG tablet Take 1 tablet by mouth daily.   Marland Kitchen tetrahydrozoline-zinc (VISINE-AC) 0.05-0.25 % ophthalmic solution Place 2 drops into both eyes 3 (three) times daily as needed.   . vitamin B-12 (CYANOCOBALAMIN) 1000 MCG tablet Take 1,000 mcg by mouth daily.   . vitamin E (VITAMIN E) 1000 UNIT capsule Take 1,000 Units by mouth daily.   Marland Kitchen zinc gluconate 50 MG tablet Take 50 mg by  mouth daily.   . [DISCONTINUED] Lactobacillus Rhamnosus, GG,  (CULTURELLE PO) Take 1 capsule by mouth daily.   Marland Kitchen ibuprofen (ADVIL,MOTRIN) 200 MG tablet Take 400-600 mg by mouth every 6 (six) hours as needed for pain. Reported on 09/09/2015   . valACYclovir (VALTREX) 1000 MG tablet Take 2 tablets at onset of cold sore.  Repeat once in 12 hours (4 pills/course)   . [DISCONTINUED] naphazoline-pheniramine (NAPHCON-A) 0.025-0.3 % ophthalmic solution Place 1 drop into both eyes daily.   . [DISCONTINUED] sertraline (ZOLOFT) 100 MG tablet Take 2 tablets (200 mg total) by mouth at bedtime.   . [DISCONTINUED] valACYclovir (VALTREX) 1000 MG tablet Take 2 tablets at onset of cold sore.  Repeat once in 12 hours (4 pills/course) (Patient not taking: Reported on 05/15/2019)    No facility-administered encounter medications on file as of 05/15/2019.    Also taking estrogen patches from GYN  Allergies  Allergen Reactions  . Sulfa Antibiotics Rash    ROS: The patient denies anorexia, fever, headaches (just occasional sinus headaches), decreased hearing, breast concerns, chest pain, palpitations, dizziness, syncope, dyspnea on exertion, cough, swelling, vomiting, diarrhea, melena, hematochezia, hematuria, dysuria, vaginal discharge, odor or itch, genital lesions, numbness, tingling, weakness, tremor, suspicious skin lesions, abnormal bleeding/bruising, or enlarged lymph nodes.  Occasional nausea.  Mild urinary urgency, rare incontinence. + constipation, chronic, controlled Chronic tinnitus (both ears), intermittent Menopausal symptoms significantly improved with use of estrogen patch Some intermittent pain in her knees. Weight change--intentional weight loss over the last year Neck/shoulder burning pain (related to posture from work).  She used to see chiro, not in a while.    PHYSICAL EXAM:  BP 140/90   Pulse 80   Temp (!) 97.1 F (36.2 C) (Other (Comment))   Ht 5' 5.5" (1.664 m)   Wt 143 lb 6.4 oz (65 kg)   LMP 01/10/2013   BMI 23.50 kg/m    128/80 on  repeat by MD  Wt Readings from Last 3 Encounters:  05/15/19 143 lb 6.4 oz (65 kg)  09/20/17 179 lb 6.4 oz (81.4 kg)  01/06/17 160 lb (72.6 kg)    General Appearance:  Alert, cooperative, no distress, appears stated age. She is in good spirits  Head:  Normocephalic, without obvious abnormality, atraumatic   Eyes:  PERRL, conjunctiva/corneas clear, EOM's intact, fundi benign   Ears:  Normal TM's and external ear canals   Nose:  Not examined, wearing mask due to COVID-19 pandemic  Throat:  Not examined, wearing mask due to COVID-19 pandemic  Neck:  Supple, no lymphadenopathy; thyroid: no enlargement/tenderness/nodules; no carotid bruit or JVD. No spinal tenderness. She has mild tenderness at lower trapezius and rhomboid muscle on the right.  No palpable spasm.  Back:  Spine nontender, no curvature, ROM normal, no CVA tenderness   Lungs:  Clear to auscultation bilaterally without wheezes, rales or ronchi; respirations unlabored   Chest Wall:  No tenderness or deformity   Heart:  Regular rate and rhythm, S1 and S2 normal, no murmur, rub or gallop   Breast Exam:  Deferred to GYN   Abdomen:  Soft,nontender,nondistended, normoactive bowel sounds, no masses, no hepatosplenomegaly.  Genitalia:  Deferred to GYN      Extremities:  No clubbing, cyanosis or edema   Pulses:  2+ and symmetric all extremities   Skin:  Skin color, texture, turgor normal, no rashes  Lymph nodes:  Cervical, supraclavicular nodes normal   Neurologic:  CNII-XII intact, normal strength, sensation and gait; reflexes 2+ and symmetric  throughout   Psych: Normal mood, affect, hygiene and grooming.    ASSESSMENT/PLAN:  Annual physical exam - Plan: Lipid panel, Comprehensive metabolic panel  PMDD (premenstrual dysphoric disorder) - Resolved s/p starting HRT, and likely post vs perimenopausal.  No longer in need of meds, to restart  if any recurrent/worsening moods  Hypercholesteremia - due for recheck - Plan: Lipid panel  Herpes labialis - refill Valtrex, uses infrequently - Plan: valACYclovir (VALTREX) 1000 MG tablet  Allergic rhinitis, unspecified seasonality, unspecified trigger - under care of ENT and stable on current regimen  Need for influenza vaccination - Plan: Flu Vaccine QUAD 6+ mos PF IM (Fluarix Quad PF)  Colon cancer screening - Plan: Ambulatory referral to Gastroenterology  Gastroesophageal reflux disease without esophagitis - on daily PPI for a while; lost weight. Discussed Risk/Benefit, and recommended trial qod/titrate down, if able to  Neck pain - muscular, related to posture/work. Shown stretches for neck/rhomboid. Consider PT vs f/u with chiro  Irritable bowel syndrome with constipation - controlled on amitiza   Discussed monthly self breast exams and yearly mammograms; at least 30 minutes of aerobic activity at least 5 days/week, weight-bearing exercise 2-3x/week; proper sunscreen use reviewed; healthy diet, including goals of calcium and vitamin D intake and alcohol recommendations (less than or equal to 1 drink/day) reviewed; regular seatbelt use; changing batteries in smoke detectors. Immunization recommendations discussed-- Yearly flu shots are recommended, given today; Shingrix recommended, risks/SE reviewed; to check insurance and schedule NV if/when desired. Colonoscopy is now due, referred to GI.  F/u 1 year, sooner prn

## 2019-05-14 NOTE — Patient Instructions (Addendum)
  HEALTH MAINTENANCE RECOMMENDATIONS:  It is recommended that you get at least 30 minutes of aerobic exercise at least 5 days/week (for weight loss, you may need as much as 60-90 minutes). This can be any activity that gets your heart rate up. This can be divided in 10-15 minute intervals if needed, but try and build up your endurance at least once a week.  Weight bearing exercise is also recommended twice weekly.  Eat a healthy diet with lots of vegetables, fruits and fiber.  "Colorful" foods have a lot of vitamins (ie green vegetables, tomatoes, red peppers, etc).  Limit sweet tea, regular sodas and alcoholic beverages, all of which has a lot of calories and sugar.  Up to 1 alcoholic drink daily may be beneficial for women (unless trying to lose weight, watch sugars).  Drink a lot of water.  Calcium recommendations are 1200-1500 mg daily (1500 mg for postmenopausal women or women without ovaries), and vitamin D 1000 IU daily.  This should be obtained from diet and/or supplements (vitamins), and calcium should not be taken all at once, but in divided doses.  Monthly self breast exams and yearly mammograms for women over the age of 72 is recommended.  Sunscreen of at least SPF 30 should be used on all sun-exposed parts of the skin when outside between the hours of 10 am and 4 pm (not just when at beach or pool, but even with exercise, golf, tennis, and yard work!)  Use a sunscreen that says "broad spectrum" so it covers both UVA and UVB rays, and make sure to reapply every 1-2 hours.  Remember to change the batteries in your smoke detectors when changing your clock times in the spring and fall. Carbon monoxide detectors are recommended for your home.  Use your seat belt every time you are in a car, and please drive safely and not be distracted with cell phones and texting while driving.  Yearly flu shots are highly encouraged (especially for 8 years old and older).  Your are due for screening  colonoscopy. We are referring you to  GI.  I recommend getting the new shingles vaccine (Shingrix). You will need to check with your insurance to see if it is covered, and if covered, schedule a nurse visit when convenient.  It is a series of 2 injections, spaced 2 months apart.  Consider trying to cut back on the prevacid to see if you truly need it every day (now that weight/diet is a little different)

## 2019-05-15 ENCOUNTER — Ambulatory Visit (INDEPENDENT_AMBULATORY_CARE_PROVIDER_SITE_OTHER): Admitting: Family Medicine

## 2019-05-15 ENCOUNTER — Other Ambulatory Visit: Payer: Self-pay

## 2019-05-15 ENCOUNTER — Encounter: Payer: Self-pay | Admitting: Family Medicine

## 2019-05-15 VITALS — BP 128/80 | HR 80 | Temp 97.1°F | Ht 65.5 in | Wt 143.4 lb

## 2019-05-15 DIAGNOSIS — J309 Allergic rhinitis, unspecified: Secondary | ICD-10-CM | POA: Diagnosis not present

## 2019-05-15 DIAGNOSIS — Z1211 Encounter for screening for malignant neoplasm of colon: Secondary | ICD-10-CM

## 2019-05-15 DIAGNOSIS — F3281 Premenstrual dysphoric disorder: Secondary | ICD-10-CM

## 2019-05-15 DIAGNOSIS — M542 Cervicalgia: Secondary | ICD-10-CM

## 2019-05-15 DIAGNOSIS — B001 Herpesviral vesicular dermatitis: Secondary | ICD-10-CM | POA: Diagnosis not present

## 2019-05-15 DIAGNOSIS — E7800 Pure hypercholesterolemia, unspecified: Secondary | ICD-10-CM

## 2019-05-15 DIAGNOSIS — Z23 Encounter for immunization: Secondary | ICD-10-CM

## 2019-05-15 DIAGNOSIS — K581 Irritable bowel syndrome with constipation: Secondary | ICD-10-CM

## 2019-05-15 DIAGNOSIS — K219 Gastro-esophageal reflux disease without esophagitis: Secondary | ICD-10-CM

## 2019-05-15 DIAGNOSIS — Z0000 Encounter for general adult medical examination without abnormal findings: Secondary | ICD-10-CM | POA: Diagnosis not present

## 2019-05-15 MED ORDER — VALACYCLOVIR HCL 1 G PO TABS: ORAL_TABLET | ORAL | 3 refills | Status: DC

## 2019-05-16 LAB — COMPREHENSIVE METABOLIC PANEL
ALT: 14 IU/L (ref 0–32)
AST: 8 IU/L (ref 0–40)
Albumin/Globulin Ratio: 1.9 (ref 1.2–2.2)
Albumin: 4.4 g/dL (ref 3.8–4.8)
Alkaline Phosphatase: 60 IU/L (ref 39–117)
BUN/Creatinine Ratio: 25 — ABNORMAL HIGH (ref 9–23)
BUN: 17 mg/dL (ref 6–24)
Bilirubin Total: 0.2 mg/dL (ref 0.0–1.2)
CO2: 24 mmol/L (ref 20–29)
Calcium: 9.6 mg/dL (ref 8.7–10.2)
Chloride: 102 mmol/L (ref 96–106)
Creatinine, Ser: 0.69 mg/dL (ref 0.57–1.00)
GFR calc Af Amer: 117 mL/min/{1.73_m2} (ref >59)
GFR calc non Af Amer: 102 mL/min/{1.73_m2} (ref >59)
Globulin, Total: 2.3 g/dL (ref 1.5–4.5)
Glucose: 96 mg/dL (ref 65–99)
Potassium: 4.3 mmol/L (ref 3.5–5.2)
Sodium: 140 mmol/L (ref 134–144)
Total Protein: 6.7 g/dL (ref 6.0–8.5)

## 2019-05-16 LAB — LIPID PANEL
Chol/HDL Ratio: 3.1 ratio (ref 0.0–4.4)
Cholesterol, Total: 199 mg/dL (ref 100–199)
HDL: 65 mg/dL (ref >39)
LDL Chol Calc (NIH): 117 mg/dL — ABNORMAL HIGH (ref 0–99)
Triglycerides: 94 mg/dL (ref 0–149)
VLDL Cholesterol Cal: 17 mg/dL (ref 5–40)

## 2019-06-11 HISTORY — PX: ABDOMINOPLASTY: SHX5355

## 2019-06-26 ENCOUNTER — Encounter: Admitting: Family Medicine

## 2019-06-26 IMAGING — MG MM CLIP PLACEMENT
2 series · 2 of 2 positions shown · non-contrast
Comparison: Previous exam(s).

CLINICAL DATA: Preoperative seed localization prior to right breast
excisional biopsy for complex sclerosing lesion.

EXAM:
ULTRASOUND GUIDED RADIOACTIVE SEED LOCALIZATION OF THE RIGHT BREAST

[R ML]
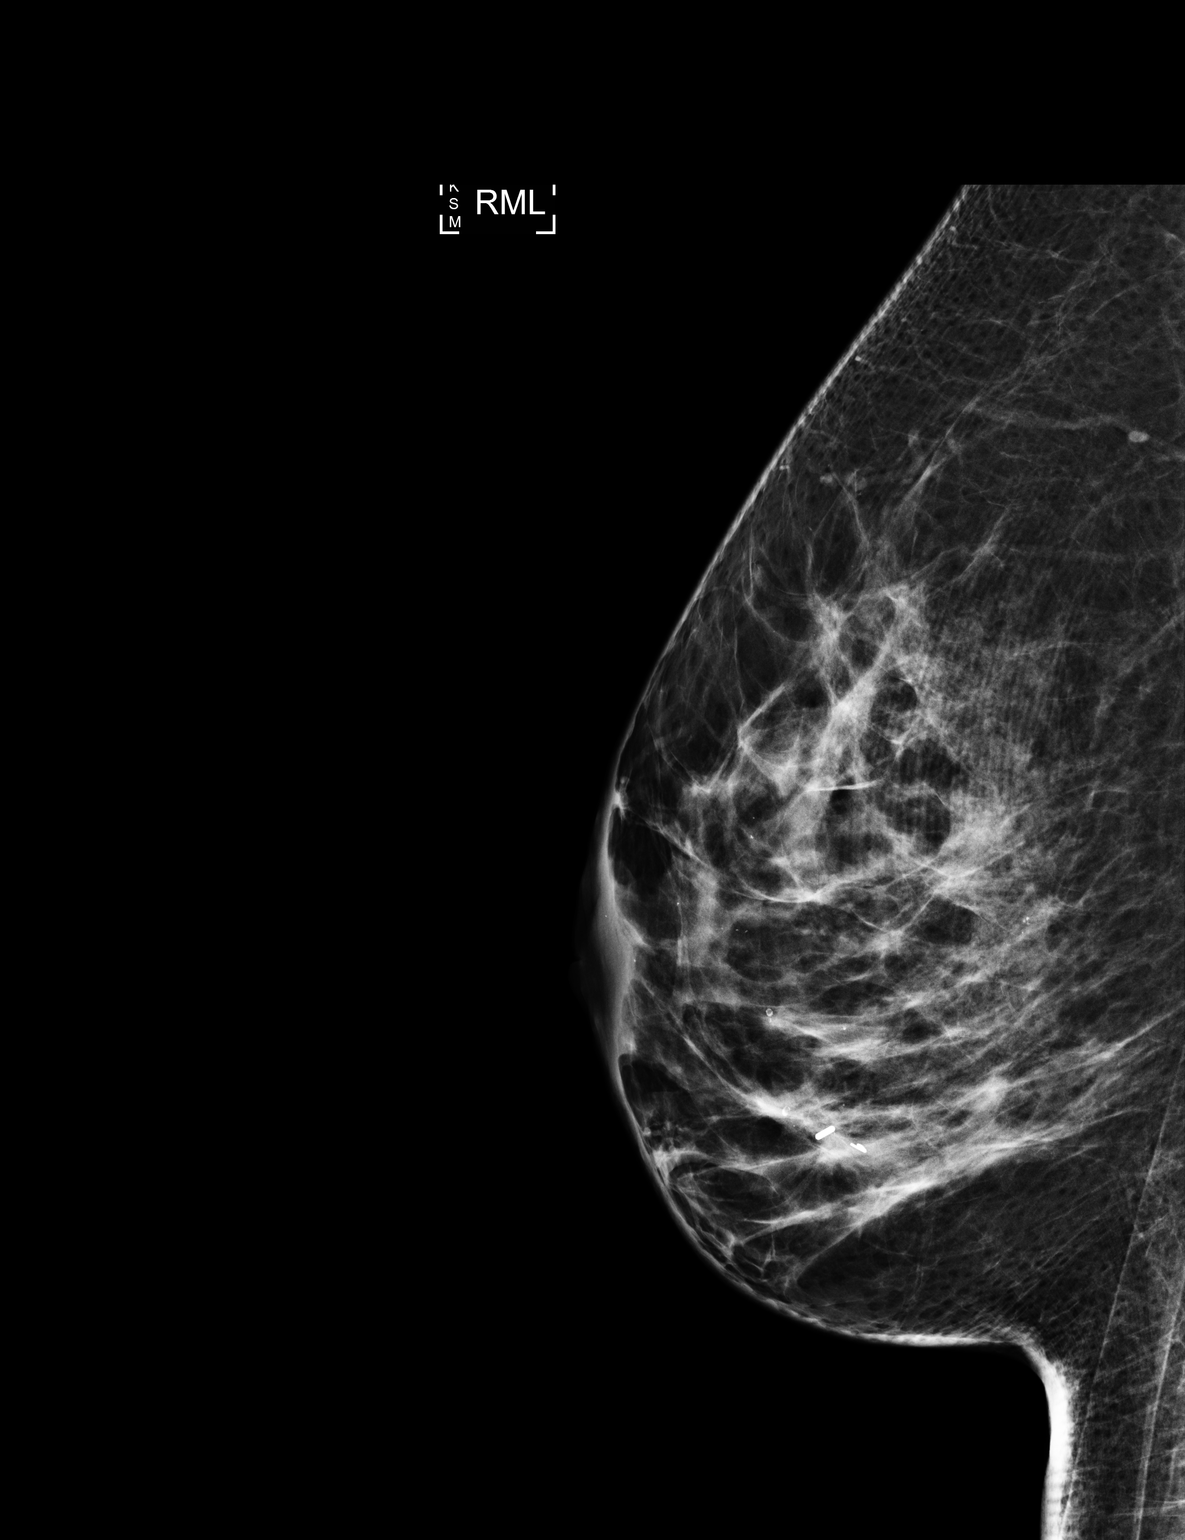

[R CC]
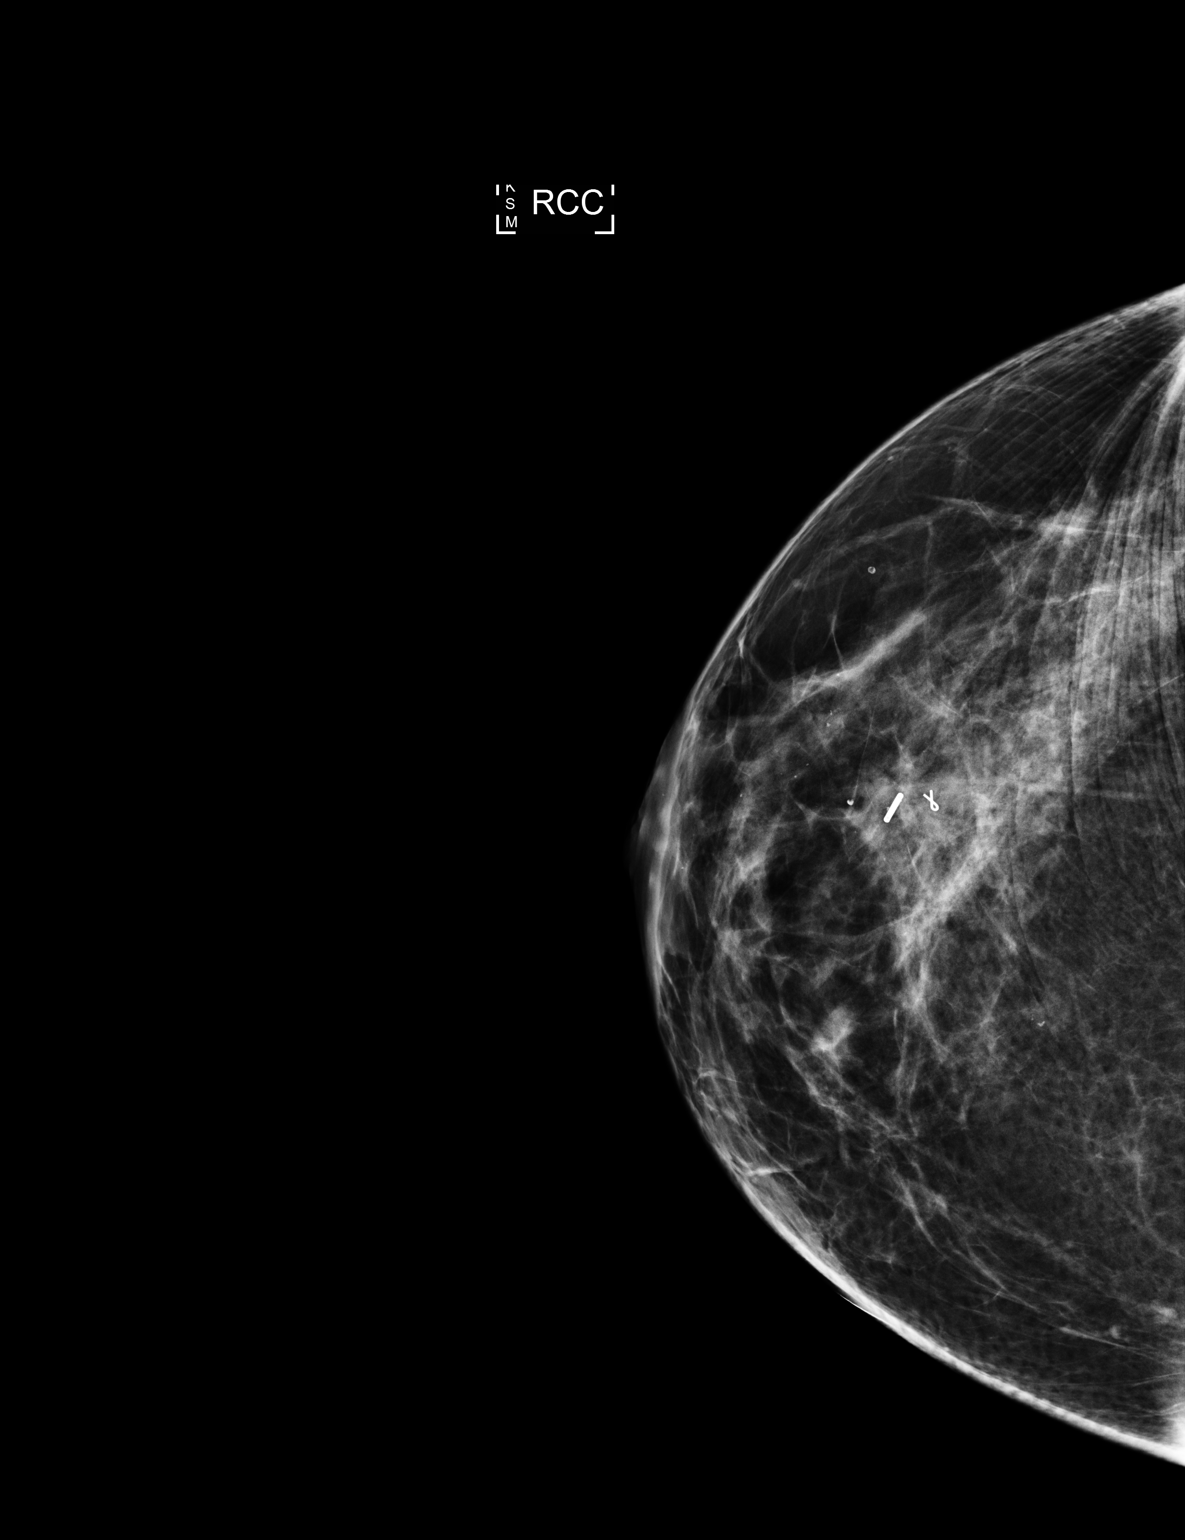

[2 of 2 positions shown; findings below may reference images not displayed]

FINDINGS: Patient presents for radioactive seed localization prior to right
breast excisional biopsy. I met with the patient and we discussed
the procedure of seed localization including benefits and
alternatives. We discussed the high likelihood of a successful
procedure. We discussed the risks of the procedure including
infection, bleeding, tissue injury and further surgery. We discussed
the low dose of radioactivity involved in the procedure. Informed,
written consent was given.

The usual time-out protocol was performed immediately prior to the
procedure.

Using ultrasound guidance, sterile technique, 1% lidocaine and an
K-MGT radioactive seed, right breast 7 o'clock mass was localized
using a medial approach. The follow-up mammogram images confirm the
seed in the expected location and were marked for Dr. Park.

Follow-up survey of the patient confirms presence of the radioactive
seed.

Order number of K-MGT seed:  [id number].

Total activity: 0.249 millicurie Reference Date: December 27, 2016

The patient tolerated the procedure well and was released from the
[REDACTED]. She was given instructions regarding seed removal.
IMPRESSION: Radioactive seed localization right breast. No apparent
complications.

## 2019-06-28 IMAGING — MG BREAST SURGICAL SPECIMEN
1 series · 1 of 1 positions shown · non-contrast
Comparison: Previous exam(s).

CLINICAL DATA: Specimen radiograph status post right breast
excisional biopsy.

EXAM:
SPECIMEN RADIOGRAPH OF THE RIGHT BREAST

[R]
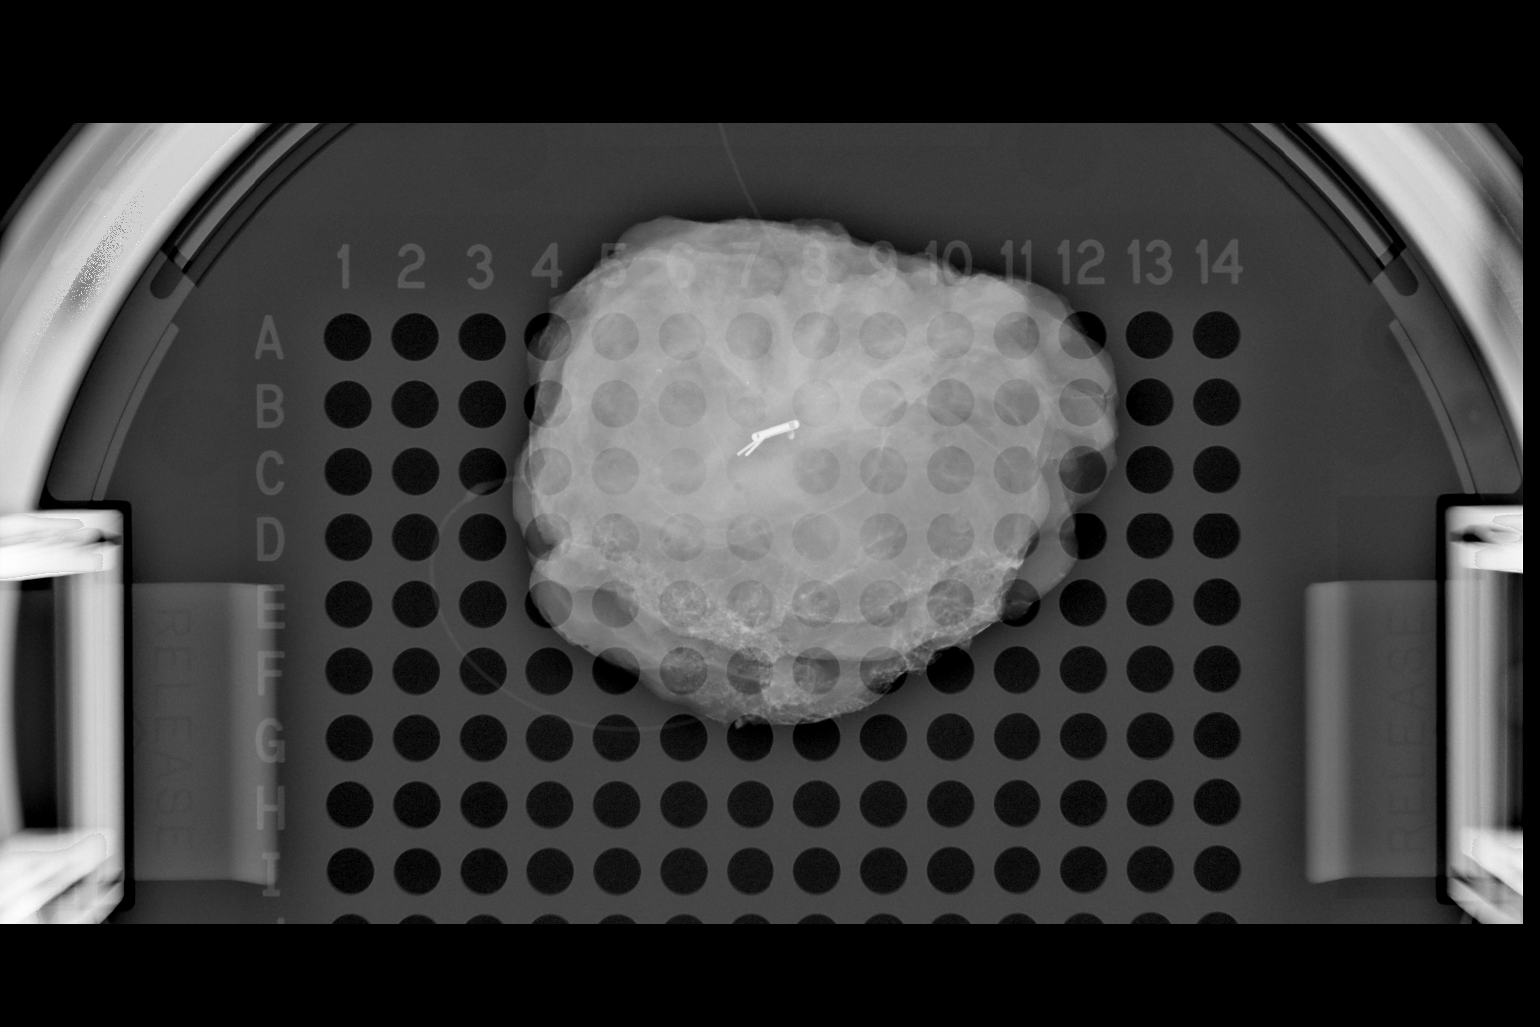

[1 of 1 positions shown; findings below may reference images not displayed]

FINDINGS: Status post excision of the right breast. The radioactive seed and
biopsy marker clip are present, completely intact, and were marked
for pathology. These findings were communicated with the OR at [DATE]
a.m..
IMPRESSION: Specimen radiograph of the right breast.

## 2019-07-12 ENCOUNTER — Other Ambulatory Visit: Payer: Self-pay | Admitting: Family Medicine

## 2019-07-12 DIAGNOSIS — K581 Irritable bowel syndrome with constipation: Secondary | ICD-10-CM

## 2019-08-14 ENCOUNTER — Encounter: Admitting: Family Medicine

## 2020-01-03 ENCOUNTER — Other Ambulatory Visit: Payer: Self-pay | Admitting: Family Medicine

## 2020-01-03 DIAGNOSIS — K581 Irritable bowel syndrome with constipation: Secondary | ICD-10-CM

## 2020-01-03 LAB — HM MAMMOGRAPHY

## 2020-01-06 NOTE — Telephone Encounter (Signed)
Is this okay to refill? 

## 2020-01-09 ENCOUNTER — Telehealth: Payer: Self-pay | Admitting: Family Medicine

## 2020-01-09 NOTE — Telephone Encounter (Signed)
Received requested records from [facility] 

## 2020-01-17 ENCOUNTER — Encounter: Payer: Self-pay | Admitting: Family Medicine

## 2020-02-07 ENCOUNTER — Telehealth: Payer: Self-pay | Admitting: Family Medicine

## 2020-02-07 DIAGNOSIS — Z8619 Personal history of other infectious and parasitic diseases: Secondary | ICD-10-CM

## 2020-02-07 NOTE — Telephone Encounter (Signed)
More info is needed to be ordered here (though she can get it done elsewhere on her own, without an order--I see it advertised at [employer] for $25) --Did she have COVID?  Has she had vaccine?  Antibody testing is NOT recommended to see if immune from the vaccine (not testing for the same antibody, does not always show up as immune on these tests).  If she had history of a viral illness and wants to know if she is immune, you can use that code to order it.  I'll be away and not see the results, but she should be able to through [facility]. Don't order if checking on immunity to vaccine. If she hasn't been vaccinated, please encourage her to get it, regardless of the antibody result, especially if her illness was more than 3 months ago (it is recommended to get as soon as 30 days after illness).

## 2020-02-07 NOTE — Telephone Encounter (Signed)
Order entered. Pt can schedule lab visit

## 2020-02-07 NOTE — Telephone Encounter (Signed)
Pt called and wants to see if she can get the antibody test done here, she is not having any issues but she just wants to know the results

## 2020-02-07 NOTE — Telephone Encounter (Signed)
Called pt and she stated that she was sick a few months ago and was never tested for covid and wants to see if she had it that's why she wanted the antibody test done. She has not been vaccinated but was encouraged to do so.

## 2020-02-10 ENCOUNTER — Other Ambulatory Visit

## 2020-02-10 ENCOUNTER — Other Ambulatory Visit: Payer: Self-pay

## 2020-02-10 DIAGNOSIS — Z8619 Personal history of other infectious and parasitic diseases: Secondary | ICD-10-CM

## 2020-02-11 LAB — SAR COV2 SEROLOGY (COVID19)AB(IGG),IA: DiaSorin SARS-CoV-2 Ab, IgG: NEGATIVE

## 2020-03-28 ENCOUNTER — Telehealth: Payer: Self-pay | Admitting: Family Medicine

## 2020-03-28 NOTE — Telephone Encounter (Signed)
P.A. AMITIZA 8MG 

## 2020-04-04 NOTE — Telephone Encounter (Signed)
P.A. approved til 03/30/21, pt informed

## 2020-06-06 NOTE — Progress Notes (Signed)
Chief Complaint  Patient presents with  . Annual Exam    fasting annual exam, no pap. Increase in stress. Left sided pain when she is under a lot of stress. States that she bowels seem to get slower when under a lot of stress.   . Immunizations    declined and has not had covid vaccines-has religious exemption.     Lauren Blanchard is a 49 y.o. female who presents for a complete physical.   She has the following concerns:  She declines COVID vaccine, has religious exemption. She is tested weekly at her job.  52 yo daughter with Turner's Syndrome recently has become very depressed (related to bullying, learning disorder, COVID making things wores).  Has been challenging navigating this, getting CBT, but she is frustrated that psych testing is not until Feb. She is very stressed about this, which is now affecting her stomach like stress did in the past (related to her ex-husband). She has been having more abdominal pain, which she relates to stress.  Stress "slows her bowels", but overall her constipation is being managed pretty well on her current regimen (see below).  H/o PMDD--She had weaned herself off of Zoloft last year when she started estradiol patch through her GYN.  Getting regular exercise helps with her moods. Doing well off SSRI, no recurrent depression.  Just the stress/anxiety related to her daughter as noted above.  Not interested in restarting medication at this time.  H/oBPV and h/o Meniere's, s/p treatment withneuro PTfor BPV flare in the past (1 session).  She gets mild vertigo just when sinuses flare, and when she gets "dried out" when the heat is turned on. Dizziness is very short-lived.  She has h/o allergies and sinuses infections. She has more problems with season/temperature changes.  She sees ENT (Dr. Laurance Flatten), and uses Flonase and Allegra-D daily, as well as saline sinus rinses daily. She uses gentamycin sinus rinses as needed (with symptoms of infection). Last seen  02/2019, has conversed with him through e-mail, and has gotten meds refilled from him.  IBS with constipation--taking 16 mcg of amitiza at bedtime, along with pericolace. She has been having more pain recently, with increased stress. Her bowels have been fairly controlled on this current regimen, things are slower .after traveling for work. Pain is worse after eating bread. She didn't like gluten-free bread. Corn products also affect her, broccoli.  GERD, h/o epigastric pain: She takes Prevacid (OTC) every day, and denies any recurrent epigastric pain, reflux, or heartburn (as long as she changed the timing of taking her Vitamin C).Denies any dysphagia with food or liquids. She is s/p abdominoplasty last year and still has some tightness/discomfort in her stomach related to this.  Vit D deficiency: She is compliant with taking daily D3 1000 IU. Vitamin D-OH level was normal 09/2017 and in 2017.  She continues on same supplements.  Hyperlipidemia: Borderline high LDL in the past, with LDL up to 179 in 2019.  Last year LDL was significantly improved at 117.  HDL has always been good.   She doesn't eat red meat or eggs, some cheese (not every day). Over the last week she had pepperoni pizza (not her usual, always eats after Thanksgiving).  Lab Results  Component Value Date   CHOL 199 05/15/2019   HDL 65 05/15/2019   LDLCALC 117 (H) 05/15/2019   TRIG 94 05/15/2019   CHOLHDL 3.1 05/15/2019   Cold sores--gets 1-2x/year. Uses Valtrex prn.  She takes it as soon as she  feels the tingle, and never actually gets a cold sore.   Immunization History  Administered Date(s) Administered  . Influenza Split 05/24/2012  . Influenza,inj,Quad PF,6+ Mos 05/01/2013, 05/15/2019  . MMR 06/07/2010, 07/08/2010  . Td 05/24/2010  . Tdap 09/03/2014   Last Pap smear: 10/2016, benign, no high risk HPV present; s/p hysterectomy Last mammogram:12/2019 Last colonoscopy: 1994 (as part of eval for IBS); referred  last year, not done. Last DEXA: never  Dentist: every 6 months  Ophtho:yearly Exercise:3x/week. 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   PMH, PSH, SH and FH were reviewed and updated  Outpatient Encounter Medications as of 06/08/2020  Medication Sig Note  . AMITIZA 8 MCG capsule TAKE 1 CAPSULE TWICE A DAY WITH MEALS 06/08/2020: Takes 2 at bedtime  . Ascorbic Acid (VITAMIN C) 1000 MG tablet Take 1,000 mg by mouth daily.   Marland Kitchen b complex vitamins capsule Take 1 capsule by mouth daily.   . cholecalciferol (VITAMIN D) 1000 UNITS tablet Take 1,000 Units by mouth daily.   Marland Kitchen conjugated estrogens (PREMARIN) vaginal cream Place 1 Applicatorful vaginally daily. 05/15/2019: Uses once a week  . estradiol (VIVELLE-DOT) 0.025 MG/24HR Place 1 patch onto the skin 2 (two) times a week.   . fexofenadine-pseudoephedrine (ALLEGRA-D 24) 180-240 MG per 24 hr tablet Take 1 tablet by mouth daily.   . fluticasone (FLONASE) 50 MCG/ACT nasal spray Place 2 sprays into the nose daily.   Marland Kitchen gentamicin (GARAMYCIN) 40 MG/ML injection Place 40 mg into the nose as needed. Reported on 09/09/2015 06/08/2020: Uses via irrigation prn purulent nasal drainage  . lansoprazole (PREVACID) 15 MG capsule Take 15 mg by mouth daily.   . sennosides-docusate sodium (SENOKOT-S) 8.6-50 MG tablet Take 1 tablet by mouth daily. 06/08/2020: Peri-colace  . tetrahydrozoline-zinc (VISINE-AC) 0.05-0.25 % ophthalmic solution Place 2 drops into both eyes 3 (three) times daily as needed.   . TURMERIC CURCUMIN PO Take by mouth.   . vitamin B-12 (CYANOCOBALAMIN) 1000 MCG tablet Take 1,000 mcg by mouth daily.   . vitamin E (VITAMIN E) 1000 UNIT capsule Take 1,000 Units by mouth daily.   Marland Kitchen zinc gluconate 50 MG tablet Take 50 mg by mouth daily.   . [DISCONTINUED] estradiol (VIVELLE-DOT) 0.0375 MG/24HR Place 1 patch onto the skin 2 (two) times a week.   Marland Kitchen ibuprofen (ADVIL,MOTRIN) 200 MG tablet Take  400-600 mg by mouth every 6 (six) hours as needed for pain. Reported on 09/09/2015 (Patient not taking: Reported on 06/08/2020)   . valACYclovir (VALTREX) 1000 MG tablet Take 2 tablets at onset of cold sore.  Repeat once in 12 hours (4 pills/course) (Patient not taking: Reported on 06/08/2020)   . [DISCONTINUED] pyridOXINE (VITAMIN B-6) 100 MG tablet Take 100 mg by mouth daily.    No facility-administered encounter medications on file as of 06/08/2020.   Allergies  Allergen Reactions  . Sulfa Antibiotics Rash    ROS: The patient denies anorexia, fever, headaches (just occasional sinus headaches), decreased hearing, breast concerns, chest pain, palpitations, dizziness, syncope, dyspnea on exertion, cough, swelling, vomiting, diarrhea, melena, hematochezia, hematuria, dysuria, vaginal discharge, odor or itch, genital lesions, numbness, tingling, weakness, tremor, suspicious skin lesions, abnormal bleeding/bruising, or enlarged lymph nodes.  Occasional nausea.  Mild urinary urgency, rare incontinence. + constipation, chronic, controlled Chronic tinnitus (both ears), intermittent Menopausal symptoms significantly improved with use of estrogen patch Some intermittent pain in her knees. Since sleeping on her back (with a  special pillow) after her stomach surgery, she no longer has any neck/shoulder pain. Some pain at L neck related to stress.   PHYSICAL EXAM:  BP 122/80   Pulse 84   Ht 5' 5"  (1.651 m)   Wt 148 lb 9.6 oz (67.4 kg)   LMP 01/10/2013   BMI 24.73 kg/m   Wt Readings from Last 3 Encounters:  06/08/20 148 lb 9.6 oz (67.4 kg)  05/15/19 143 lb 6.4 oz (65 kg)  09/20/17 179 lb 6.4 oz (81.4 kg)    General Appearance:  Alert, cooperative, no distress, appears stated age. Somewhat stressed/anxious today, about her daughter's care issues/frustrations  Head:  Normocephalic, without obvious abnormality, atraumatic   Eyes:  PERRL, conjunctiva/corneas clear, EOM's intact, fundi  benign   Ears:  Normal TM's and external ear canals   Nose:  Not examined, wearing mask due to COVID-19 pandemic  Throat:  Not examined, wearing mask due to COVID-19 pandemic  Neck:  Supple, no lymphadenopathy; thyroid: no enlargement/tenderness/nodules; no carotid bruit or JVD. No spinal tenderness. No spasm or muscle tenderness at trapezius currently  Back:  Spine nontender, no curvature, ROM normal, no CVA tenderness   Lungs:  Clear to auscultation bilaterally without wheezes, rales or ronchi; respirations unlabored   Chest Wall:  No tenderness or deformity   Heart:  Regular rate and rhythm, S1 and S2 normal, no murmur, rub or gallop   Breast Exam:  Deferred to GYN   Abdomen:  Soft, nondistended, normoactive bowel sounds, no hepatosplenomegaly. WHSS around umbilicus and across the entire lower abdomen.  Very mild tenderness superficially at epigastrium, and somewhat sensitive throughout. No rebound, guarding or masses.  Genitalia:  Deferred to GYN      Extremities:  No clubbing, cyanosis or edema   Pulses:  2+ and symmetric all extremities   Skin:  Skin color, texture, turgor normal, no rashes, though exam is limited, not undressed.  Lymph nodes:  Cervical, supraclavicular nodes normal   Neurologic:  Normal strength, sensation and gait; reflexes 2+ and symmetric throughout   Psych: Normal mood, affect, hygiene and grooming.    ASSESSMENT/PLAN:  Annual physical exam - Plan: Visual acuity screening, POCT Urinalysis DIP (Proadvantage Device), CBC with Differential/Platelet, Comprehensive metabolic panel, Lipid panel, Hepatitis C antibody  Hypercholesteremia - improved last year, controlled by diet. Due for recheck. Recent pepperoni pizza was out of her normal diet - Plan: Lipid panel  Herpes labialis - cont Valtrex prn  Allergic rhinitis, unspecified seasonality, unspecified trigger - controlled with  current regimen  Colon cancer screening - refer again to GI. Screening options discussed. She is concerned about prep--advised to d/w GI. Discussed Cologuard as another option - Plan: Ambulatory referral to Gastroenterology  Gastroesophageal reflux disease without esophagitis - controlled with OTC PPI.  She reports her mild tenderness has been since her stomach surgery.    Irritable bowel syndrome with constipation - bowels are fairly well controlled with Amitiza and pericolace.  Sone pain recently related to stress.  Discussed stress reduction, mindfulness, counseling  Need for hepatitis C screening test - Plan: Hepatitis C antibody  Vaccine counseling - encouraged flu, Shingrix  and COVID vaccines.  Advised of the need to test for sx early on, and seek treatment early on for any +COVID test  Counseled regarding diet, foods triggering her pain, and what she can try (Beano prior, vs lactaid, vs simethicone prn). May need to consider antispasmodics if increasing pain. Really hasn't worsened her constipation too much.  Counseled regarding  her stress, how it affects her.  Encouraged regular exercise, mindfulness/relaxation techniques, to consider counseling for herself, since she bottles everything up.   Discussed these issues at length. Total FTF time was over an hour.  Discussed monthly self breast exams and yearly mammograms; at least 30 minutes of aerobic activity at least 5 days/week, weight-bearing exercise 2-3x/week; proper sunscreen use reviewed; healthy diet, including goals of calcium and vitamin D intake and alcohol recommendations (less than or equal to 1 drink/day) reviewed; regular seatbelt use; changing batteries in smoke detectors. Immunization recommendations discussed-- Yearly flu shots are recommended. Shingrix recommended, risks/SE reviewed; to check insurance and schedule NV if/when desired. Colonoscopy is due, referred to GI last year. She would like to have colonoscopy, but  prefers to wait a bit due to her daughter's appts.  Also very concerned about how much she has to drink for prep (gets nauseated just thinking about drinking that much liquid, even if gatorade)--advised she needs to discuss prep concerns with GI, to see what her options are. If decides against colonoscopy, to let us know for Cologuard referral. Has hemorrhoids flaring now.  Will think about it  F/u 1 year, sooner prn

## 2020-06-07 NOTE — Patient Instructions (Addendum)
HEALTH MAINTENANCE RECOMMENDATIONS:  It is recommended that you get at least 30 minutes of aerobic exercise at least 5 days/week (for weight loss, you may need as much as 60-90 minutes). This can be any activity that gets your heart rate up. This can be divided in 10-15 minute intervals if needed, but try and build up your endurance at least once a week.  Weight bearing exercise is also recommended twice weekly.  Eat a healthy diet with lots of vegetables, fruits and fiber.  "Colorful" foods have a lot of vitamins (ie green vegetables, tomatoes, red peppers, etc).  Limit sweet tea, regular sodas and alcoholic beverages, all of which has a lot of calories and sugar.  Up to 1 alcoholic drink daily may be beneficial for women (unless trying to lose weight, watch sugars).  Drink a lot of water.  Calcium recommendations are 1200-1500 mg daily (1500 mg for postmenopausal women or women without ovaries), and vitamin D 1000 IU daily.  This should be obtained from diet and/or supplements (vitamins), and calcium should not be taken all at once, but in divided doses.  Monthly self breast exams and yearly mammograms for women over the age of 38 is recommended.  Sunscreen of at least SPF 30 should be used on all sun-exposed parts of the skin when outside between the hours of 10 am and 4 pm (not just when at beach or pool, but even with exercise, golf, tennis, and yard work!)  Use a sunscreen that says "broad spectrum" so it covers both UVA and UVB rays, and make sure to reapply every 1-2 hours.  Remember to change the batteries in your smoke detectors when changing your clock times in the spring and fall. Carbon monoxide detectors are recommended for your home.  Use your seat belt every time you are in a car, and please drive safely and not be distracted with cell phones and texting while driving.  We discussed colon cancer screening options, including Cologuard and colonoscopy.  I think it might be best to  talk about your prep concerns with the folks at [facility] (or other gastroenterologists), and then let me know if you prefer to do Cologuard instead.  I recommend getting the new shingles vaccine (Shingrix). If you have commercial insurance, check with your insurance to verify what your out of pocket cost may be (usually covered as preventative, but better to verify to avoid any surprises, as this vaccine is expensive), and then schedule a nurse visit at our office when convenient (based on the possible side effects as discussed).   This is a series of 2 injections, spaced 2 months apart.  It doesn't have to be exactly 2 months apart (but can't be under 2 months), if that isn't feasible for your schedule, but try and get them close to 2 months (and definitely within 6 months of each other, or else the efficacy of the vaccine drops off).  Consider getting counseling for yourself to help with your stress.  You can try simethicone (ie Gas-X) as needed for pain. You can consider trying Beano before eating (especially related to gas caused by certain vegetables).   Mindfulness-Based Stress Reduction Mindfulness-based stress reduction (MBSR) is a program that helps people learn to practice mindfulness. Mindfulness is the practice of intentionally paying attention to the present moment. It can be learned and practiced through techniques such as education, breathing exercises, meditation, and yoga. MBSR includes several mindfulness techniques in one program. MBSR works best when you understand the  treatment, are willing to try new things, and can commit to spending time practicing what you learn. MBSR training may include learning about:  How your emotions, thoughts, and reactions affect your body.  New ways to respond to things that cause negative thoughts to start (triggers).  How to notice your thoughts and let go of them.  Practicing awareness of everyday things that you normally do without  thinking.  The techniques and goals of different types of meditation. What are the benefits of MBSR? MBSR can have many benefits, which include helping you to:  Develop self-awareness. This refers to knowing and understanding yourself.  Learn skills and attitudes that help you to participate in your own health care.  Learn new ways to care for yourself.  Be more accepting about how things are, and let things go.  Be less judgmental and approach things with an open mind.  Be patient with yourself and trust yourself more. MBSR has also been shown to:  Reduce negative emotions, such as depression and anxiety.  Improve memory and focus.  Change how you sense and approach pain.  Boost your body's ability to fight infections.  Help you connect better with other people.  Improve your sense of well-being. Follow these instructions at home:   Find a local in-person or online MBSR program.  Set aside some time regularly for mindfulness practice.  Find a mindfulness practice that works best for you. This may include one or more of the following: ? Meditation. Meditation involves focusing your mind on a certain thought or activity. ? Breathing awareness exercises. These help you to stay present by focusing on your breath. ? Body scan. For this practice, you lie down and pay attention to each part of your body from head to toe. You can identify tension and soreness and intentionally relax parts of your body. ? Yoga. Yoga involves stretching and breathing, and it can improve your ability to move and be flexible. It can also provide an experience of testing your body's limits, which can help you release stress. ? Mindful eating. This way of eating involves focusing on the taste, texture, color, and smell of each bite of food. Because this slows down eating and helps you feel full sooner, it can be an important part of a weight-loss plan.  Find a podcast or recording that provides  guidance for breathing awareness, body scan, or meditation exercises. You can listen to these any time when you have a free moment to rest without distractions.  Follow your treatment plan as told by your health care provider. This may include taking regular medicines and making changes to your diet or lifestyle as recommended. How to practice mindfulness To do a basic awareness exercise:  Find a comfortable place to sit.  Pay attention to the present moment. Observe your thoughts, feelings, and surroundings just as they are.  Avoid placing judgment on yourself, your feelings, or your surroundings. Make note of any judgment that comes up, and let it go.  Your mind may wander, and that is okay. Make note of when your thoughts drift, and return your attention to the present moment. To do basic mindfulness meditation:  Find a comfortable place to sit. This may include a stable chair or a firm floor cushion. ? Sit upright with your back straight. Let your arms fall next to your side with your hands resting on your legs. ? If sitting in a chair, rest your feet flat on the floor. ? If sitting  on a cushion, cross your legs in front of you.  Keep your head in a neutral position with your chin dropped slightly. Relax your jaw and rest the tip of your tongue on the roof of your mouth. Drop your gaze to the floor. You can close your eyes if you like.  Breathe normally and pay attention to your breath. Feel the air moving in and out of your nose. Feel your belly expanding and relaxing with each breath.  Your mind may wander, and that is okay. Make note of when your thoughts drift, and return your attention to your breath.  Avoid placing judgment on yourself, your feelings, or your surroundings. Make note of any judgment or feelings that come up, let them go, and bring your attention back to your breath.  When you are ready, lift your gaze or open your eyes. Pay attention to how your body feels after  the meditation. Where to find more information You can find more information about MBSR from:  Your health care provider.  Community-based meditation centers or programs.  Programs offered near you. Summary  Mindfulness-based stress reduction (MBSR) is a program that teaches you how to intentionally pay attention to the present moment. It is used with other treatments to help you cope better with daily stress, emotions, and pain.  MBSR focuses on developing self-awareness, which allows you to respond to life stress without judgment or negative emotions.  MBSR programs may involve learning different mindfulness practices, such as breathing exercises, meditation, yoga, body scan, or mindful eating. Find a mindfulness practice that works best for you, and set aside time for it on a regular basis. This information is not intended to replace advice given to you by your health care provider. Make sure you discuss any questions you have with your health care provider. Document Revised: 06/09/2017 Document Reviewed: 11/03/2016 Elsevier Patient Education  [employer].

## 2020-06-08 ENCOUNTER — Encounter: Payer: Self-pay | Admitting: Family Medicine

## 2020-06-08 ENCOUNTER — Ambulatory Visit (INDEPENDENT_AMBULATORY_CARE_PROVIDER_SITE_OTHER): Admitting: Family Medicine

## 2020-06-08 ENCOUNTER — Other Ambulatory Visit: Payer: Self-pay

## 2020-06-08 VITALS — BP 122/80 | HR 84 | Ht 65.0 in | Wt 148.6 lb

## 2020-06-08 DIAGNOSIS — J309 Allergic rhinitis, unspecified: Secondary | ICD-10-CM

## 2020-06-08 DIAGNOSIS — B001 Herpesviral vesicular dermatitis: Secondary | ICD-10-CM | POA: Diagnosis not present

## 2020-06-08 DIAGNOSIS — K219 Gastro-esophageal reflux disease without esophagitis: Secondary | ICD-10-CM

## 2020-06-08 DIAGNOSIS — K581 Irritable bowel syndrome with constipation: Secondary | ICD-10-CM

## 2020-06-08 DIAGNOSIS — Z1159 Encounter for screening for other viral diseases: Secondary | ICD-10-CM

## 2020-06-08 DIAGNOSIS — Z0000 Encounter for general adult medical examination without abnormal findings: Secondary | ICD-10-CM

## 2020-06-08 DIAGNOSIS — Z7185 Encounter for immunization safety counseling: Secondary | ICD-10-CM | POA: Diagnosis not present

## 2020-06-08 DIAGNOSIS — E7800 Pure hypercholesterolemia, unspecified: Secondary | ICD-10-CM | POA: Diagnosis not present

## 2020-06-08 DIAGNOSIS — Z1211 Encounter for screening for malignant neoplasm of colon: Secondary | ICD-10-CM

## 2020-06-08 LAB — POCT URINALYSIS DIP (PROADVANTAGE DEVICE)
Bilirubin, UA: NEGATIVE
Blood, UA: NEGATIVE
Glucose, UA: NEGATIVE mg/dL
Leukocytes, UA: NEGATIVE
Nitrite, UA: NEGATIVE
Specific Gravity, Urine: 1.02
Urobilinogen, Ur: NEGATIVE
pH, UA: 6.5 (ref 5.0–8.0)

## 2020-06-09 LAB — COMPREHENSIVE METABOLIC PANEL
ALT: 20 IU/L (ref 0–32)
AST: 18 IU/L (ref 0–40)
Albumin/Globulin Ratio: 2 (ref 1.2–2.2)
Albumin: 4.9 g/dL (ref 3.8–4.9)
Alkaline Phosphatase: 60 IU/L (ref 44–121)
BUN/Creatinine Ratio: 27 — ABNORMAL HIGH (ref 9–23)
BUN: 17 mg/dL (ref 6–24)
Bilirubin Total: 0.3 mg/dL (ref 0.0–1.2)
CO2: 24 mmol/L (ref 20–29)
Calcium: 9.8 mg/dL (ref 8.7–10.2)
Chloride: 102 mmol/L (ref 96–106)
Creatinine, Ser: 0.63 mg/dL (ref 0.57–1.00)
GFR calc Af Amer: 120 mL/min/{1.73_m2} (ref >59)
GFR calc non Af Amer: 104 mL/min/{1.73_m2} (ref >59)
Globulin, Total: 2.5 g/dL (ref 1.5–4.5)
Glucose: 93 mg/dL (ref 65–99)
Potassium: 4.5 mmol/L (ref 3.5–5.2)
Sodium: 140 mmol/L (ref 134–144)
Total Protein: 7.4 g/dL (ref 6.0–8.5)

## 2020-06-09 LAB — CBC WITH DIFFERENTIAL/PLATELET
Basophils Absolute: 0 10*3/uL (ref 0.0–0.2)
Basos: 1 %
EOS (ABSOLUTE): 0.1 10*3/uL (ref 0.0–0.4)
Eos: 2 %
Hematocrit: 41.9 % (ref 34.0–46.6)
Hemoglobin: 13.9 g/dL (ref 11.1–15.9)
Immature Grans (Abs): 0 10*3/uL (ref 0.0–0.1)
Immature Granulocytes: 0 %
Lymphocytes Absolute: 2.4 10*3/uL (ref 0.7–3.1)
Lymphs: 43 %
MCH: 31.2 pg (ref 26.6–33.0)
MCHC: 33.2 g/dL (ref 31.5–35.7)
MCV: 94 fL (ref 79–97)
Monocytes Absolute: 0.5 10*3/uL (ref 0.1–0.9)
Monocytes: 9 %
Neutrophils Absolute: 2.5 10*3/uL (ref 1.4–7.0)
Neutrophils: 45 %
Platelets: 342 10*3/uL (ref 150–450)
RBC: 4.46 x10E6/uL (ref 3.77–5.28)
RDW: 12.2 % (ref 11.7–15.4)
WBC: 5.6 10*3/uL (ref 3.4–10.8)

## 2020-06-09 LAB — LIPID PANEL
Chol/HDL Ratio: 3.5 ratio (ref 0.0–4.4)
Cholesterol, Total: 260 mg/dL — ABNORMAL HIGH (ref 100–199)
HDL: 75 mg/dL (ref >39)
LDL Chol Calc (NIH): 161 mg/dL — ABNORMAL HIGH (ref 0–99)
Triglycerides: 136 mg/dL (ref 0–149)
VLDL Cholesterol Cal: 24 mg/dL (ref 5–40)

## 2020-06-09 LAB — HEPATITIS C ANTIBODY: Hep C Virus Ab: <0.1 s/co ratio (ref 0.0–0.9)

## 2020-06-16 ENCOUNTER — Other Ambulatory Visit: Payer: Self-pay | Admitting: Family Medicine

## 2020-06-16 DIAGNOSIS — K581 Irritable bowel syndrome with constipation: Secondary | ICD-10-CM

## 2020-08-26 ENCOUNTER — Ambulatory Visit: Admitting: Gastroenterology

## 2020-09-01 ENCOUNTER — Other Ambulatory Visit: Payer: Self-pay | Admitting: Family Medicine

## 2020-09-01 DIAGNOSIS — K581 Irritable bowel syndrome with constipation: Secondary | ICD-10-CM

## 2020-09-09 ENCOUNTER — Encounter: Payer: Self-pay | Admitting: Gastroenterology

## 2020-09-09 ENCOUNTER — Ambulatory Visit (INDEPENDENT_AMBULATORY_CARE_PROVIDER_SITE_OTHER): Admitting: Gastroenterology

## 2020-09-09 VITALS — BP 130/80 | HR 80 | Ht 65.0 in | Wt 150.2 lb

## 2020-09-09 DIAGNOSIS — K581 Irritable bowel syndrome with constipation: Secondary | ICD-10-CM | POA: Diagnosis not present

## 2020-09-09 DIAGNOSIS — R109 Unspecified abdominal pain: Secondary | ICD-10-CM | POA: Diagnosis not present

## 2020-09-09 DIAGNOSIS — R110 Nausea: Secondary | ICD-10-CM

## 2020-09-09 DIAGNOSIS — Z1211 Encounter for screening for malignant neoplasm of colon: Secondary | ICD-10-CM | POA: Diagnosis not present

## 2020-09-09 MED ORDER — ONDANSETRON HCL 4 MG PO TABS: 4.0000 mg | ORAL_TABLET | Freq: Three times a day (TID) | ORAL | 1 refills | Status: DC | PRN

## 2020-09-09 MED ORDER — DICYCLOMINE HCL 10 MG PO CAPS: 10.0000 mg | ORAL_CAPSULE | Freq: Three times a day (TID) | ORAL | 3 refills | Status: DC | PRN

## 2020-09-09 NOTE — Progress Notes (Signed)
Lauren Blanchard    [id number]    20-Apr-1971  Primary Care Physician:Knapp, Tera Helper, MD  Referring Physician: Rita Ohara, MD [address] [place],  [place] [address]   Chief complaint: Colorectal cancer screening, chronic constipation  HPI:  50 year old very pleasant female here to discuss colorectal cancer screening.  She has history of chronic constipation, currently takes senna and Amitiza.  She had colonoscopy many years ago and she had a traumatic experience and she is very hesitant to undergo repeat colonoscopy  Denies any nausea, vomiting, abdominal pain, melena or bright red blood per rectum  No family history of colon cancer   Outpatient Encounter Medications as of 09/09/2020  Medication Sig  . AMITIZA 8 MCG capsule TAKE 1 CAPSULE TWICE A DAY WITH MEALS (Patient taking differently: Takes 2 at bedtime instead of one BID)  . Ascorbic Acid (VITAMIN C) 1000 MG tablet Take 1,000 mg by mouth daily.  Marland Kitchen b complex vitamins capsule Take 1 capsule by mouth daily.  . cholecalciferol (VITAMIN D) 1000 UNITS tablet Take 1,000 Units by mouth daily.  Marland Kitchen conjugated estrogens (PREMARIN) vaginal cream Place 1 Applicatorful vaginally daily.  Marland Kitchen estradiol (VIVELLE-DOT) 0.05 MG/24HR patch Place 1 patch onto the skin 2 (two) times a week.  . fexofenadine-pseudoephedrine (ALLEGRA-D 24) 180-240 MG per 24 hr tablet Take 1 tablet by mouth daily.  . fluticasone (FLONASE) 50 MCG/ACT nasal spray Place 2 sprays into the nose daily.  Marland Kitchen gentamicin (GARAMYCIN) 40 MG/ML injection Place 40 mg into the nose as needed. Reported on 09/09/2015  . ibuprofen (ADVIL,MOTRIN) 200 MG tablet Take 400-600 mg by mouth every 6 (six) hours as needed for pain. Reported on 09/09/2015  . lansoprazole (PREVACID) 15 MG capsule Take 15 mg by mouth daily.  . sennosides-docusate sodium (SENOKOT-S) 8.6-50 MG tablet Take 1 tablet by mouth daily.  Marland Kitchen tetrahydrozoline-zinc (VISINE-AC) 0.05-0.25 % ophthalmic solution Place 2  drops into both eyes 3 (three) times daily as needed.  . TURMERIC CURCUMIN PO Take by mouth.  . valACYclovir (VALTREX) 1000 MG tablet Take 2 tablets at onset of cold sore.  Repeat once in 12 hours (4 pills/course) (Patient taking differently: Take 2 tablets at onset of cold sore.  Repeat once in 12 hours (4 pills/course))  . vitamin B-12 (CYANOCOBALAMIN) 1000 MCG tablet Take 1,000 mcg by mouth daily.  . vitamin E 1000 UNIT capsule Take 1,000 Units by mouth daily.  Marland Kitchen zinc gluconate 50 MG tablet Take 50 mg by mouth daily.  . [DISCONTINUED] estradiol (VIVELLE-DOT) 0.025 MG/24HR Place 1 patch onto the skin 2 (two) times a week.   No facility-administered encounter medications on file as of 09/09/2020.    Allergies as of 09/09/2020 - Review Complete 09/09/2020  Allergen Reaction Noted  . Sulfa antibiotics Rash 05/24/2012    Past Medical History:  Diagnosis Date  . Abnormal mammogram of right breast 01/06/2017  . Epigastric pain March,2014   Takes Prevacid (pain resolved)  . Hemorrhagic ovarian cyst 08/2015   right  . IBS (irritable bowel syndrome)    constipation predominant  . Iron deficiency anemia 2014  . Meniere disease 2001   chronic tinnitus; no vertigo since limiting sodium intake  . PMDD (premenstrual dysphoric disorder)    takes Zoloft for this  . PONV (postoperative nausea and vomiting)    has had PONV with most of her past surgeries  . Seasonal allergies     Past Surgical History:  Procedure Laterality Date  .  ABDOMINOPLASTY  06/2019  . BILATERAL SALPINGECTOMY Bilateral 01/17/2013   Procedure: BILATERAL SALPINGECTOMY;  Surgeon: Allyn Kenner, DO;  Location: [facility];  Service: Gynecology;  Laterality: Bilateral;  . BREAST LUMPECTOMY WITH RADIOACTIVE SEED LOCALIZATION Right 01/06/2017   Procedure: RIGHT BREAST LUMPECTOMY WITH RADIOACTIVE SEED LOCALIZATION;  Surgeon: Fanny Skates, MD;  Location: [facility];  Service: General;  Laterality: Right;  . CESAREAN  SECTION     x2: 2001 and 2004  . CYSTOSCOPY  01/17/2013   Procedure: CYSTOSCOPY;  Surgeon: Allyn Kenner, DO;  Location: [facility];  Service: Gynecology;;  . LEEP  1997  . MANDIBLE SURGERY  2007  . NASAL SINUS SURGERY     x3  . OTHER SURGICAL HISTORY  2006  . ROBOTIC ASSISTED TOTAL HYSTERECTOMY N/A 01/17/2013   Procedure: ROBOTIC ASSISTED TOTAL HYSTERECTOMY;  Surgeon: Allyn Kenner, DO;  Location: [facility];  Service: Gynecology;  Laterality: N/A;  . TUBAL LIGATION  2004  . WISDOM TOOTH EXTRACTION      Family History  Problem Relation Age of Onset  . Diabetes Mother   . Hypertension Mother   . Irritable bowel syndrome Mother   . Alzheimer's disease Father 84  . Hypertension Maternal Grandmother   . Hyperlipidemia Maternal Grandmother   . Stroke Maternal Grandfather   . Diabetes Maternal Grandfather   . Diabetes Paternal Grandmother   . Alzheimer's disease Paternal Grandmother   . Heart disease Paternal Grandfather 79       died of MI  . Turner syndrome Daughter   . Depression Daughter   . Colon cancer Other        mggf  . Cancer Neg Hx     Social History   Socioeconomic History  . Marital status: Married    Spouse name: Octavia Bruckner  . Number of children: 3  . Years of education: Not on file  . Highest education level: Not on file  Occupational History  . Occupation: Art therapist  Tobacco Use  . Smoking status: Never Smoker  . Smokeless tobacco: Never Used  Vaping Use  . Vaping Use: Never used  Substance and Sexual Activity  . Alcohol use: No  . Drug use: No  . Sexual activity: Yes    Partners: Male    Birth control/protection: Surgical  Other Topics Concern  . Not on file  Social History Narrative   Divorced and re-married.  Lives with 2/3 children, husband.  Oldest daughter is married and living in [place]. 1 dog, Malti-poo  (Ralphie 2018), turtle.  No tobacco exposure.   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, treating patients at  nursing homes).   Social Determinants of Health   Financial Resource Strain: Not on file  Food Insecurity: Not on file  Transportation Needs: Not on file  Physical Activity: Not on file  Stress: Not on file  Social Connections: Not on file  Intimate Partner Violence: Not on file      Review of systems: All other review of systems negative except as mentioned in the HPI.   Physical Exam: Vitals:   09/09/20 1018  BP: 130/80  Pulse: 80   Body mass index is 24.99 kg/m. Gen:      No acute distress HEENT:  sclera anicteric Abd:      soft, non-tender; no palpable masses, no distension Ext:    No edema Neuro: alert and oriented x 3 Psych: normal mood and affect  Data Reviewed:  Reviewed labs, radiology imaging, old records  and pertinent past GI work up   Assessment and Plan/Recommendations:  50 year old very pleasant female with history of chronic irritable bowel syndrome constipation here to discuss colorectal cancer screening  Patient had a bad experience with colonoscopy many years ago and she is hesitant to undergo colonoscopy. I discussed in detail the risks and benefits as well as alternatives to colonoscopy, answered all questions. Advised patient to call back when she is willing to schedule the procedure  GERD: continue antireflux measures and Prevacid   Nausea: use Zofran as needed  IBS with abdominal cramping: Use dicyclomine 6 to 8 hours as needed   IBS constipation: Continue Amitiza and senna Increase dietary fiber and water intake   The patient was provided an opportunity to ask questions and all were answered. The patient agreed with the plan and demonstrated an understanding of the instructions.  Damaris Hippo , MD    CC: Rita Ohara, MD

## 2020-09-09 NOTE — Patient Instructions (Addendum)
It has been recommended to you by your physician that you have a(n) Colonoscopy completed. Per your request, we did not schedule the procedure(s) today. Please contact our office at [phone] should you decide to have the procedure completed. You will be scheduled for a pre-visit and procedure at that time.  Continue Amitiza  Take Zofran 4 mg daily as needed, we will send to your pharmacy ([facility])  We will send Bentyl 10 mg to your pharmacy ([facility])       Colonoscopy, Adult A colonoscopy is a procedure to look at the entire large intestine. This procedure is done using a long, thin, flexible tube that has a camera on the end. You may have a colonoscopy:  As a part of normal colorectal screening.  If you have certain symptoms, such as: ? A low number of red blood cells in your blood (anemia). ? Diarrhea that does not go away. ? Pain in your abdomen. ? Blood in your stool. A colonoscopy can help screen for and diagnose medical problems, including:  Tumors.  Extra tissue that grows where mucus forms (polyps).  Inflammation.  Areas of bleeding. Tell your health care provider about:  Any allergies you have.  All medicines you are taking, including vitamins, herbs, eye drops, creams, and over-the-counter medicines.  Any problems you or family members have had with anesthetic medicines.  Any blood disorders you have.  Any surgeries you have had.  Any medical conditions you have.  Any problems you have had with having bowel movements.  Whether you are pregnant or may be pregnant. What are the risks? Generally, this is a safe procedure. However, problems may occur, including:  Bleeding.  Damage to your intestine.  Allergic reactions to medicines given during the procedure.  Infection. This is rare. What happens before the procedure? Eating and drinking restrictions Follow instructions from your health care provider about eating or drinking  restrictions, which may include:  A few days before the procedure: ? Follow a low-fiber diet. ? Avoid nuts, seeds, dried fruit, raw fruits, and vegetables.  1-3 days before the procedure: ? Eat only gelatin dessert or ice pops. ? Drink only clear liquids, such as water, clear juice, clear broth or bouillon, black coffee or tea, or clear soft drinks or sports drinks. ? Avoid liquids that contain red or purple dye.  The day of the procedure: ? Do not eat solid foods. You may continue to drink clear liquids until up to 2 hours before the procedure. ? Do not eat or drink anything starting 2 hours before the procedure, or within the time period that your health care provider recommends. Bowel prep If you were prescribed a bowel prep to take by mouth (orally) to clean out your colon:  Take it as told by your health care provider. Starting the day before your procedure, you will need to drink a large amount of liquid medicine. The liquid will cause you to have many bowel movements of loose stool until your stool becomes almost clear or light green.  If your skin or the opening between the buttocks (anus) gets irritated from diarrhea, you may relieve the irritation using: ? Wipes with medicine in them, such as adult wet wipes with aloe and vitamin E. ? A product to soothe skin, such as petroleum jelly.  If you vomit while drinking the bowel prep: ? Take a break for up to 60 minutes. ? Begin the bowel prep again. ? Call your health care provider if  you keep vomiting or you cannot take the bowel prep without vomiting.  To clean out your colon, you may also be given: ? Laxative medicines. These help you have a bowel movement. ? Instructions for enema use. An enema is liquid medicine injected into your rectum. Medicines Ask your health care provider about:  Changing or stopping your regular medicines or supplements. This is especially important if you are taking iron supplements, diabetes  medicines, or blood thinners.  Taking medicines such as aspirin and ibuprofen. These medicines can thin your blood. Do not take these medicines unless your health care provider tells you to take them.  Taking over-the-counter medicines, vitamins, herbs, and supplements. General instructions  Ask your health care provider what steps will be taken to help prevent infection. These may include washing skin with a germ-killing soap.  Plan to have someone take you home from the hospital or clinic. What happens during the procedure?  An IV will be inserted into one of your veins.  You may be given one or more of the following: ? A medicine to help you relax (sedative). ? A medicine to numb the area (local anesthetic). ? A medicine to make you fall asleep (general anesthetic). This is rarely needed.  You will lie on your side with your knees bent.  The tube will: ? Have oil or gel put on it (be lubricated). ? Be inserted into your anus. ? Be gently eased through all parts of your large intestine.  Air will be sent into your colon to keep it open. This may cause some pressure or cramping.  Images will be taken with the camera and will appear on a screen.  A small tissue sample may be removed to be looked at under a microscope (biopsy). The tissue may be sent to a lab for testing if any signs of problems are found.  If small polyps are found, they may be removed and checked for cancer cells.  When the procedure is finished, the tube will be removed. The procedure may vary among health care providers and hospitals.   What happens after the procedure?  Your blood pressure, heart rate, breathing rate, and blood oxygen level will be monitored until you leave the hospital or clinic.  You may have a small amount of blood in your stool.  You may pass gas and have mild cramping or bloating in your abdomen. This is caused by the air that was used to open your colon during the exam.  Do not  drive for 24 hours after the procedure.  It is up to you to get the results of your procedure. Ask your health care provider, or the department that is doing the procedure, when your results will be ready. Summary  A colonoscopy is a procedure to look at the entire large intestine.  Follow instructions from your health care provider about eating and drinking before the procedure.  If you were prescribed an oral bowel prep to clean out your colon, take it as told by your health care provider.  During the colonoscopy, a flexible tube with a camera on its end is inserted into the anus and then passed into the other parts of the large intestine. This information is not intended to replace advice given to you by your health care provider. Make sure you discuss any questions you have with your health care provider. Document Revised: 01/18/2019 Document Reviewed: 01/18/2019 Elsevier Patient Education  2021 [employer].   I appreciate the  opportunity to care for you  Thank You   Harl Bowie , MD

## 2020-09-11 ENCOUNTER — Encounter: Payer: Self-pay | Admitting: Gastroenterology

## 2020-10-02 ENCOUNTER — Encounter: Payer: Self-pay | Admitting: Gastroenterology

## 2020-10-28 ENCOUNTER — Encounter: Payer: Self-pay | Admitting: Family Medicine

## 2020-11-13 ENCOUNTER — Encounter

## 2020-11-27 ENCOUNTER — Encounter: Admitting: Gastroenterology

## 2020-12-03 ENCOUNTER — Encounter

## 2020-12-04 ENCOUNTER — Encounter

## 2020-12-11 ENCOUNTER — Other Ambulatory Visit: Payer: Self-pay | Admitting: Family Medicine

## 2020-12-11 DIAGNOSIS — K581 Irritable bowel syndrome with constipation: Secondary | ICD-10-CM

## 2020-12-11 NOTE — Telephone Encounter (Signed)
Ok to refill or does she need to get this from GI

## 2020-12-18 ENCOUNTER — Encounter: Admitting: Gastroenterology

## 2021-01-18 LAB — HM MAMMOGRAPHY

## 2021-01-25 ENCOUNTER — Encounter: Payer: Self-pay | Admitting: *Deleted

## 2021-02-08 HISTORY — PX: COLONOSCOPY: SHX174

## 2021-02-12 ENCOUNTER — Other Ambulatory Visit: Payer: Self-pay

## 2021-02-12 ENCOUNTER — Ambulatory Visit (AMBULATORY_SURGERY_CENTER): Admitting: *Deleted

## 2021-02-12 VITALS — Ht 65.0 in | Wt 150.0 lb

## 2021-02-12 DIAGNOSIS — Z1211 Encounter for screening for malignant neoplasm of colon: Secondary | ICD-10-CM

## 2021-02-12 MED ORDER — CLENPIQ 10-3.5-12 MG-GM -GM/160ML PO SOLN: 1.0000 | ORAL | 0 refills | Status: DC

## 2021-02-12 NOTE — Progress Notes (Signed)
Patient's pre-visit was done today over the phone with the patient due to COVID-19 pandemic. Name,DOB and address verified. Insurance verified. Patient denies any allergies to Eggs and Soy. Patient denies any problems with anesthesia/sedation. Patient denies taking diet pills or blood thinners. No home Oxygen. Packet of Prep instructions mailed to patient including a copy of a consent form-pt is aware. Patient understands to call us back with any questions or concerns. Patient is aware of our care-partner policy and 0000000 safety protocol.   EMMI education assigned to the patient for the procedure, sent to [facility].   Patient did not tolerate prep last colon-pt request low volume prep. I went over options. Pt request clenpiq-she will use Zofran at home with the prep also.

## 2021-02-26 ENCOUNTER — Ambulatory Visit (AMBULATORY_SURGERY_CENTER): Admitting: Gastroenterology

## 2021-02-26 ENCOUNTER — Telehealth: Payer: Self-pay | Admitting: Gastroenterology

## 2021-02-26 ENCOUNTER — Other Ambulatory Visit: Payer: Self-pay

## 2021-02-26 ENCOUNTER — Encounter: Payer: Self-pay | Admitting: Gastroenterology

## 2021-02-26 VITALS — BP 135/83 | HR 87 | Temp 97.7°F | Resp 11 | Ht 65.0 in | Wt 150.0 lb

## 2021-02-26 DIAGNOSIS — Z538 Procedure and treatment not carried out for other reasons: Secondary | ICD-10-CM

## 2021-02-26 DIAGNOSIS — Z1211 Encounter for screening for malignant neoplasm of colon: Secondary | ICD-10-CM

## 2021-02-26 MED ORDER — SODIUM CHLORIDE 0.9 % IV SOLN: 500.0000 mL | Freq: Once | INTRAVENOUS | Status: DC

## 2021-02-26 NOTE — Patient Instructions (Signed)
Resume previous diet Continue current medications Repeat colonoscopy at next available appt  YOU HAD AN ENDOSCOPIC PROCEDURE TODAY AT [facility]:   Refer to the procedure report that was given to you for any specific questions about what was found during the examination.  If the procedure report does not answer your questions, please call your gastroenterologist to clarify.  If you requested that your care partner not be given the details of your procedure findings, then the procedure report has been included in a sealed envelope for you to review at your convenience later.  YOU SHOULD EXPECT: Some feelings of bloating in the abdomen. Passage of more gas than usual.  Walking can help get rid of the air that was put into your GI tract during the procedure and reduce the bloating. If you had a lower endoscopy (such as a colonoscopy or flexible sigmoidoscopy) you may notice spotting of blood in your stool or on the toilet paper. If you underwent a bowel prep for your procedure, you may not have a normal bowel movement for a few days.  Please Note:  You might notice some irritation and congestion in your nose or some drainage.  This is from the oxygen used during your procedure.  There is no need for concern and it should clear up in a day or so.  SYMPTOMS TO REPORT IMMEDIATELY: Following lower endoscopy (colonoscopy or flexible sigmoidoscopy):  Excessive amounts of blood in the stool  Significant tenderness or worsening of abdominal pains  Swelling of the abdomen that is new, acute  Fever of 100F or higher  For urgent or emergent issues, a gastroenterologist can be reached at any hour by calling [phone]. Do not use MyChart messaging for urgent concerns.   DIET:  We do recommend a small meal at first, but then you may proceed to your regular diet.  Drink plenty of fluids but you should avoid alcoholic beverages for 24 hours.  ACTIVITY:  You should plan to take it easy for  the rest of today and you should NOT DRIVE or use heavy machinery until tomorrow (because of the sedation medicines used during the test).    FOLLOW UP: Our staff will call the number listed on your records 48-72 hours following your procedure to check on you and address any questions or concerns that you may have regarding the information given to you following your procedure. If we do not reach you, we will leave a message.  We will attempt to reach you two times.  During this call, we will ask if you have developed any symptoms of COVID 19. If you develop any symptoms (ie: fever, flu-like symptoms, shortness of breath, cough etc.) before then, please call [phone].  If you test positive for Covid 19 in the 2 weeks post procedure, please call and report this information to [place].    If any biopsies were taken you will be contacted by phone or by letter within the next 1-3 weeks.  Please call us at [phone] if you have not heard about the biopsies in 3 weeks.   SIGNATURES/CONFIDENTIALITY: You and/or your care partner have signed paperwork which will be entered into your electronic medical record.  These signatures attest to the fact that that the information above on your After Visit Summary has been reviewed and is understood.  Full responsibility of the confidentiality of this discharge information lies with you and/or your care-partner.

## 2021-02-26 NOTE — Progress Notes (Signed)
Report to PACU, RN, vss, BBS= Clear.  

## 2021-02-26 NOTE — Progress Notes (Signed)
VS-SM  Pt's states no medical or surgical changes since previsit or office visit.  

## 2021-02-26 NOTE — Op Note (Signed)
[facility] Patient Name: Lauren Blanchard Procedure Date: 02/26/2021 3:13 PM MRN: [id number] Endoscopist: Mauri Pole , MD Age: 50 Referring MD:  Date of Birth: 05-Nov-1970 Gender: Female Account #: [id number] Procedure:                Colonoscopy Indications:              Screening for colorectal malignant neoplasm Medicines:                Monitored Anesthesia Care Procedure:                Pre-Anesthesia Assessment:                           - Prior to the procedure, a History and Physical                            was performed, and patient medications and                            allergies were reviewed. The patient's tolerance of                            previous anesthesia was also reviewed. The risks                            and benefits of the procedure and the sedation                            options and risks were discussed with the patient.                            All questions were answered, and informed consent                            was obtained. Prior Anticoagulants: The patient has                            taken no previous anticoagulant or antiplatelet                            agents. ASA Grade Assessment: II - A patient with                            mild systemic disease. After reviewing the risks                            and benefits, the patient was deemed in                            satisfactory condition to undergo the procedure.                           After obtaining informed consent, the colonoscope  was passed under direct vision. Throughout the                            procedure, the patient's blood pressure, pulse, and                            oxygen saturations were monitored continuously. The                            Olympus [id number]-[id number]) Colonoscope was                            introduced through the anus with the intention of                            advancing to  the cecum. The scope was advanced to                            the transverse colon before the procedure was                            aborted. Medications were given. The colonoscopy                            was technically difficult and complex due to poor                            bowel prep with stool present. The patient                            tolerated the procedure well. The quality of the                            bowel preparation was poor. The rectum was                            photographed. Scope In: 3:29:30 PM Scope Out: 3:32:33 PM Total Procedure Duration: 0 hours 3 minutes 3 seconds  Findings:                 The perianal and digital rectal examinations were                            normal.                           A moderate amount of stool was found in the entire                            colon, making visualization difficult. Complications:            No immediate complications. Estimated Blood Loss:     Estimated blood loss: none. Impression:               - Preparation of the colon was poor.                           -  Stool in the entire examined colon.                           - No specimens collected. Recommendation:           - Resume previous diet.                           - Continue present medications.                           - Repeat colonoscopy at the next available                            appointment because the bowel preparation was                            suboptimal.                           - For future colonoscopy the patient will require                            an extended preparation. If there are any                            questions, please contact the gastroenterologist. Mauri Pole, MD 02/26/2021 3:38:09 PM This report has been signed electronically.

## 2021-02-26 NOTE — Telephone Encounter (Signed)
All questions answered regarding prep- reminded her orange juice is not a clear liquid, she must have nothing by mouth after the 1:00 pm time, and if she is irritated on her rectal area, she may use Vaseline as needed

## 2021-03-02 ENCOUNTER — Telehealth: Payer: Self-pay | Admitting: *Deleted

## 2021-03-02 NOTE — Telephone Encounter (Signed)
No answer for post procedure call back. Left VM. 

## 2021-03-03 NOTE — Telephone Encounter (Signed)
Inbound call from patient. Requesting a call back. Would not tell me the reason.

## 2021-03-03 NOTE — Telephone Encounter (Signed)
Phoned pt.  Gave her Dr. Woodward Ku instructions.  She verbalized understanding.

## 2021-03-03 NOTE — Telephone Encounter (Signed)
Pt has not had a BM since her procedure on Friday.  She routinely has problems with constipation and is concerned.   I explained that it is normal for her to not have had a BM due to prep but states that she is uncomfortable today.  Dr. Silverio Decamp please advise.

## 2021-03-03 NOTE — Telephone Encounter (Signed)
Agree that it may be few days before her bowel habits return to baseline after bowel prep. Please advise patient to use Miralax 1 capful BID with meals and increase water intake.Thanks

## 2021-03-17 ENCOUNTER — Other Ambulatory Visit: Payer: Self-pay

## 2021-03-17 ENCOUNTER — Encounter: Payer: Self-pay | Admitting: Emergency Medicine

## 2021-03-17 ENCOUNTER — Telehealth: Payer: Self-pay | Admitting: Emergency Medicine

## 2021-03-17 ENCOUNTER — Ambulatory Visit
Admission: EM | Admit: 2021-03-17 | Discharge: 2021-03-17 | Disposition: A | Discharge status: Home / Self Care | Attending: Emergency Medicine | Admitting: Emergency Medicine

## 2021-03-17 DIAGNOSIS — J0140 Acute pansinusitis, unspecified: Secondary | ICD-10-CM

## 2021-03-17 DIAGNOSIS — Z20822 Contact with and (suspected) exposure to covid-19: Secondary | ICD-10-CM

## 2021-03-17 DIAGNOSIS — H81399 Other peripheral vertigo, unspecified ear: Secondary | ICD-10-CM

## 2021-03-17 MED ORDER — IBUPROFEN 600 MG PO TABS: 600.0000 mg | ORAL_TABLET | Freq: Four times a day (QID) | ORAL | 0 refills | Status: DC | PRN

## 2021-03-17 MED ORDER — DOXYCYCLINE HYCLATE 100 MG PO CAPS: 100.0000 mg | ORAL_CAPSULE | Freq: Two times a day (BID) | ORAL | 0 refills | Status: AC

## 2021-03-17 MED ORDER — PREDNISONE 20 MG PO TABS: 40.0000 mg | ORAL_TABLET | Freq: Every day | ORAL | 0 refills | Status: AC

## 2021-03-17 MED ORDER — AMOXICILLIN-POT CLAVULANATE 875-125 MG PO TABS: 1.0000 | ORAL_TABLET | Freq: Two times a day (BID) | ORAL | 0 refills | Status: DC

## 2021-03-17 NOTE — ED Triage Notes (Signed)
Pt here with possible sinus infection. Dizziness and right ear pain since Sunday along with head and sinus pressure. Pt requests prednisone for pressure.

## 2021-03-17 NOTE — Discharge Instructions (Addendum)
I am sending you home with prednisone, Augmentin, 600 mg of ibuprofen combined with 1000 mg of Tylenol together 3-4 times a day as needed for pain, headache.   continue Flonase, saline nasal irrigation and erythromycin irrigation.  Your COVID test will be back in a day or 2.  We will contact you if it is positive.

## 2021-03-17 NOTE — ED Provider Notes (Signed)
HPI  SUBJECTIVE:  Lauren Blanchard is a 50 y.o. female who presents with 3 days of vertigo accompanied with nausea and vomiting, tinnitus, feeling feverish, but with no documented fever, right ear pressure, yellow rhinorrhea, right ear popping, jaw pain, frontal headache, sinus pain and pressure, upper dental pain.  She reports some phonophobia.  No change in hearing, otorrhea.  No ear pain.  No nasal congestion, facial swelling, slurred speech, facial arm weakness, visual changes, photophobia, stiff neck.  No antibiotics in the past month.  No antipyretic in the past 6 hours.  She has been doing saline nasal irrigation, 600 mg ibuprofen combined with 1000 mg of Tylenol twice a day, Flonase, and has started erythromycin saline irrigation yesterday.  Tylenol/ibuprofen, saline nasal irrigation help.  Vertigo is also better with keeping her head in 1 position, her vertigo is worse when she turns her head quickly, lies on her back and her sinus pain and pressure is worse with bending forward.  She states that she grinds her teeth, but has a bite guard.  She had a negative rapid COVID test yesterday.  She is also using Flonase.  No known exposure to COVID.  She did not get COVID-vaccine.  She is status post 3 sinus surgeries, has a history of frequent sinusitis, Mnire's disease, COVID in January 2022.  [id number], Eve, MD she has follow-up with ENT on 9/26   Past Medical History:  Diagnosis Date   Abnormal mammogram of right breast 01/06/2017   Epigastric pain March,2014   Takes Prevacid (pain resolved)   Hemorrhagic ovarian cyst 08/2015   right   IBS (irritable bowel syndrome)    constipation predominant   Iron deficiency anemia 2014   Meniere disease 2001   chronic tinnitus; no vertigo since limiting sodium intake   PMDD (premenstrual dysphoric disorder)    takes Zoloft for this   PONV (postoperative nausea and vomiting)    has had PONV with most of her past surgeries   Seasonal allergies     Past  Surgical History:  Procedure Laterality Date   ABDOMINOPLASTY  06/2019   BILATERAL SALPINGECTOMY Bilateral 01/17/2013   Procedure: BILATERAL SALPINGECTOMY;  Surgeon: Allyn Kenner, DO;  Location: [facility];  Service: Gynecology;  Laterality: Bilateral;   BREAST LUMPECTOMY WITH RADIOACTIVE SEED LOCALIZATION Right 01/06/2017   Procedure: RIGHT BREAST LUMPECTOMY WITH RADIOACTIVE SEED LOCALIZATION;  Surgeon: Fanny Skates, MD;  Location: [facility];  Service: General;  Laterality: Right;   CESAREAN SECTION     x2: 2001 and 2004   COLONOSCOPY     at age 38 per pt-very painful procedure and painful air afterwards   CYSTOSCOPY  01/17/2013   Procedure: CYSTOSCOPY;  Surgeon: Allyn Kenner, DO;  Location: [facility];  Service: Gynecology;;   LEEP  [place]  2007   NASAL SINUS SURGERY     x3   OTHER SURGICAL HISTORY  2006   ROBOTIC ASSISTED TOTAL HYSTERECTOMY N/A 01/17/2013   Procedure: ROBOTIC ASSISTED TOTAL HYSTERECTOMY;  Surgeon: Allyn Kenner, DO;  Location: [facility];  Service: Gynecology;  Laterality: N/A;   TUBAL LIGATION  2004   WISDOM TOOTH EXTRACTION      Family History  Problem Relation Age of Onset   Diabetes Mother    Hypertension Mother    Irritable bowel syndrome Mother    Alzheimer's disease Father 38   Hypertension Maternal Grandmother    Hyperlipidemia Maternal Grandmother    Stroke Maternal Grandfather    Diabetes Maternal  Grandfather    Diabetes Paternal Grandmother    Alzheimer's disease Paternal Grandmother    Heart disease Paternal Grandfather 69       died of MI   Turner syndrome Daughter    Depression Daughter    Colon cancer Other        mggf   Cancer Neg Hx    Esophageal cancer Neg Hx    Stomach cancer Neg Hx    Colon polyps Neg Hx     Social History   Tobacco Use   Smoking status: Never   Smokeless tobacco: Never  Vaping Use   Vaping Use: Never used  Substance Use Topics   Alcohol use: No   Drug use: No    No  current facility-administered medications for this encounter.  Current Outpatient Medications:    AMITIZA 8 MCG capsule, TAKE 1 CAPSULE TWICE A DAY WITH MEALS, Disp: 180 capsule, Rfl: 1   Ascorbic Acid (VITAMIN C) 1000 MG tablet, Take 1,000 mg by mouth daily., Disp: , Rfl:    b complex vitamins capsule, Take 1 capsule by mouth daily., Disp: , Rfl:    BIOTIN PO, Take by mouth., Disp: , Rfl:    cholecalciferol (VITAMIN D) 1000 UNITS tablet, Take 1,000 Units by mouth daily., Disp: , Rfl:    conjugated estrogens (PREMARIN) vaginal cream, Place 1 Applicatorful vaginally daily., Disp: , Rfl:    dicyclomine (BENTYL) 10 MG capsule, Take 1 capsule (10 mg total) by mouth every 8 (eight) hours as needed for spasms., Disp: 90 capsule, Rfl: 3   estradiol (VIVELLE-DOT) 0.075 MG/24HR, Place onto the skin., Disp: , Rfl:    EVENING PRIMROSE OIL PO, Take by mouth., Disp: , Rfl:    fexofenadine-pseudoephedrine (ALLEGRA-D 24) 180-240 MG per 24 hr tablet, Take 1 tablet by mouth daily., Disp: , Rfl:    fluticasone (FLONASE) 50 MCG/ACT nasal spray, Place 2 sprays into the nose daily., Disp: , Rfl:    gentamicin (GARAMYCIN) 40 MG/ML injection, Place 40 mg into the nose as needed. Reported on 09/09/2015, Disp: , Rfl:    ibuprofen (ADVIL,MOTRIN) 200 MG tablet, Take 400-600 mg by mouth every 6 (six) hours as needed for pain. Reported on 09/09/2015, Disp: , Rfl:    lansoprazole (PREVACID) 15 MG capsule, Take 15 mg by mouth daily., Disp: , Rfl:    ondansetron (ZOFRAN) 4 MG tablet, Take 1 tablet (4 mg total) by mouth every 8 (eight) hours as needed for nausea or vomiting., Disp: 90 tablet, Rfl: 1   sennosides-docusate sodium (SENOKOT-S) 8.6-50 MG tablet, Take 1 tablet by mouth daily., Disp: , Rfl:    tetrahydrozoline-zinc (VISINE-AC) 0.05-0.25 % ophthalmic solution, Place 2 drops into both eyes 3 (three) times daily as needed., Disp: , Rfl:    TURMERIC CURCUMIN PO, Take by mouth., Disp: , Rfl:    valACYclovir (VALTREX) 1000 MG  tablet, Take 2 tablets at onset of cold sore.  Repeat once in 12 hours (4 pills/course) (Patient taking differently: Take 2 tablets at onset of cold sore.  Repeat once in 12 hours (4 pills/course)), Disp: 20 tablet, Rfl: 3   vitamin B-12 (CYANOCOBALAMIN) 1000 MCG tablet, Take 1,000 mcg by mouth daily., Disp: , Rfl:    vitamin E 1000 UNIT capsule, Take 1,000 Units by mouth daily., Disp: , Rfl:    zinc gluconate 50 MG tablet, Take 50 mg by mouth daily., Disp: , Rfl:   Allergies  Allergen Reactions   Sulfa Antibiotics Rash     ROS  As noted in HPI.   Physical Exam  BP (!) 180/87   Pulse 81   Temp 98.2 F (36.8 C)   Resp 20   LMP 01/10/2013   SpO2 98%   Constitutional: Well developed, well nourished, no acute distress Eyes:  EOMI, conjunctiva normal bilaterally HENT: Normocephalic, atraumatic,mucus membranes moist.  Positive purulent nasal congestion.  Normal turbinates.  Positive maxillary, frontal sinusitis worse on the right.  TMs normal bilaterally.  No TMJ tenderness bilaterally.  No jaw crepitus. Neck: No cervical lymphadenopathy Respiratory: Normal inspiratory effort Cardiovascular: Normal rate, regular rhythm GI: nondistended skin: No rash, skin intact Musculoskeletal: no deformities Neurologic: Alert & oriented x 3, no focal neuro deficits.  Tandem gait steady.  Romberg negative. Psychiatric: Speech and behavior appropriate   ED Course   Medications - No data to display  Orders Placed This Encounter  Procedures   Novel Coronavirus, NAA ([facility])    Standing Status:   Standing    Number of Occurrences:   1    No results found for this or any previous visit (from the past 24 hour(s)). No results found.  ED Clinical Impression  1. Acute pansinusitis, recurrence not specified   2. Encounter for laboratory testing for COVID-19 virus   3. Peripheral vertigo, unspecified laterality      ED Assessment/Plan  Patient with a pansinusitis and peripheral vertigo.   Suspect the vertigo is coming from the nasal congestion.  Home with prednisone, Augmentin, Tylenol/ibuprofen.  She is to continue Flonase, saline nasal irrigation and erythromycin irrigation as well.  will check for COVID.  She will qualify for antiviral treatment because she is not vaccinated.  GFR from 05/2020 104.   Work note for 2 days.  COVID testing pending at the time of the signing this note.  Discussed labs, MDM, treatment plan, and plan for follow-up with patient. Discussed sn/sx that should prompt return to the ED. patient agrees with plan.   No orders of the defined types were placed in this encounter.     *This clinic note was created using Dragon dictation software. Therefore, there may be occasional mistakes despite careful proofreading.  ?    Melynda Ripple, MD 03/18/21 [phone]

## 2021-03-18 LAB — SARS-COV-2, NAA 2 DAY TAT

## 2021-03-18 LAB — NOVEL CORONAVIRUS, NAA: SARS-CoV-2, NAA: NOT DETECTED

## 2021-04-02 ENCOUNTER — Ambulatory Visit
Admission: EM | Admit: 2021-04-02 | Discharge: 2021-04-02 | Disposition: A | Discharge status: Home / Self Care | Attending: Emergency Medicine | Admitting: Emergency Medicine

## 2021-04-02 ENCOUNTER — Encounter: Payer: Self-pay | Admitting: Emergency Medicine

## 2021-04-02 ENCOUNTER — Other Ambulatory Visit: Payer: Self-pay

## 2021-04-02 DIAGNOSIS — H109 Unspecified conjunctivitis: Secondary | ICD-10-CM

## 2021-04-02 DIAGNOSIS — N898 Other specified noninflammatory disorders of vagina: Secondary | ICD-10-CM | POA: Diagnosis not present

## 2021-04-02 MED ORDER — FLUCONAZOLE 150 MG PO TABS: 150.0000 mg | ORAL_TABLET | Freq: Every day | ORAL | 0 refills | Status: DC

## 2021-04-02 MED ORDER — POLYMYXIN B-TRIMETHOPRIM 10000-0.1 UNIT/ML-% OP SOLN
1.0000 [drp] | Freq: Four times a day (QID) | OPHTHALMIC | 0 refills | Status: AC
Start: 2021-04-02 — End: 2021-04-09

## 2021-04-02 NOTE — Discharge Instructions (Addendum)
Use the antibiotic eyedrops as prescribed.  Follow-up with your eye doctor for a recheck in 1 to 2 days if your symptoms are not improving.  Go to the emergency department if you have acute eye pain, changes in your vision, or other concerning symptoms.    Take the Diflucan as directed.  Follow up with your primary care provider if your symptoms are not improving.

## 2021-04-02 NOTE — ED Provider Notes (Signed)
[facility] Pal[facility]    CSN: [id number] Arrival date & time: 04/02/21  0865      History   Chief Complaint Chief Complaint  Patient presents with   Eye Drainage    HPI Lauren Blanchard is a 50 y.o. female.  Patient presents with 2-week history of bilateral eye itching, redness, clear drainage.  She also reports crusting in her lashes in the mornings.  Treatment attempted at home with OTC allergy eyedrops and OTC pinkeye eyedrops.  She denies acute eye pain or changes in her vision.  She denies fever, chills, congestion, cough, or other symptoms.  Patient was seen here on 03/17/2021; diagnosed with acute pansinusitis, COVID testing, peripheral vertigo; treated with prednisone and Augmentin; COVID negative.  The pharmacy did not have the Augmentin so the patient was prescribed doxycycline; she took the doxycycline, then the pharmacy had the Augmentin so she took this also.  She states she needs a prescription for Diflucan today because she has a yeast infection from the antibiotics.  She reports mild vaginal irritation; no vaginal discharge at this time.  The history is provided by the patient and medical records.   Past Medical History:  Diagnosis Date   Abnormal mammogram of right breast 01/06/2017   Epigastric pain March,2014   Takes Prevacid (pain resolved)   Hemorrhagic ovarian cyst 08/2015   right   IBS (irritable bowel syndrome)    constipation predominant   Iron deficiency anemia 2014   Meniere disease 2001   chronic tinnitus; no vertigo since limiting sodium intake   PMDD (premenstrual dysphoric disorder)    takes Zoloft for this   PONV (postoperative nausea and vomiting)    has had PONV with most of her past surgeries   Seasonal allergies     Patient Active Problem List   Diagnosis Date Noted   Abnormal mammogram of right breast 01/06/2017   Hemorrhagic ovarian cyst 09/09/2015   IBS (irritable bowel syndrome) 10/17/2012   Herpes labialis 10/17/2012   Vitamin D  deficiency 10/17/2012   Urge urinary incontinence 08/28/2012   PMDD (premenstrual dysphoric disorder) 08/28/2012   Epigastric pain 05/24/2012    Past Surgical History:  Procedure Laterality Date   ABDOMINOPLASTY  06/2019   BILATERAL SALPINGECTOMY Bilateral 01/17/2013   Procedure: BILATERAL SALPINGECTOMY;  Surgeon: Allyn Kenner, DO;  Location: [facility];  Service: Gynecology;  Laterality: Bilateral;   BREAST LUMPECTOMY WITH RADIOACTIVE SEED LOCALIZATION Right 01/06/2017   Procedure: RIGHT BREAST LUMPECTOMY WITH RADIOACTIVE SEED LOCALIZATION;  Surgeon: Fanny Skates, MD;  Location: [facility];  Service: General;  Laterality: Right;   CESAREAN SECTION     x2: 2001 and 2004   COLONOSCOPY     at age 32 per pt-very painful procedure and painful air afterwards   CYSTOSCOPY  01/17/2013   Procedure: CYSTOSCOPY;  Surgeon: Allyn Kenner, DO;  Location: [facility];  Service: Gynecology;;   LEEP  [place]  2007   NASAL SINUS SURGERY     x3   OTHER SURGICAL HISTORY  2006   ROBOTIC ASSISTED TOTAL HYSTERECTOMY N/A 01/17/2013   Procedure: ROBOTIC ASSISTED TOTAL HYSTERECTOMY;  Surgeon: Allyn Kenner, DO;  Location: [facility];  Service: Gynecology;  Laterality: N/A;   TUBAL LIGATION  2004   WISDOM TOOTH EXTRACTION      OB History     Gravida  4   Para  3   Term      Preterm      AB  1  Living  3      SAB  1   IAB      Ectopic      Multiple      Live Births               Home Medications    Prior to Admission medications   Medication Sig Start Date End Date Taking? Authorizing Provider  AMITIZA 8 MCG capsule TAKE 1 CAPSULE TWICE A DAY WITH MEALS 12/11/20  Yes Rita Ohara, MD  Ascorbic Acid (VITAMIN C) 1000 MG tablet Take 1,000 mg by mouth daily.   Yes [provider]  b complex vitamins capsule Take 1 capsule by mouth daily.   Yes [provider]  BIOTIN PO Take by mouth.   Yes [provider]  cholecalciferol  (VITAMIN D) 1000 UNITS tablet Take 1,000 Units by mouth daily.   Yes [provider]  conjugated estrogens (PREMARIN) vaginal cream Place 1 Applicatorful vaginally daily.   Yes [provider]  dicyclomine (BENTYL) 10 MG capsule Take 1 capsule (10 mg total) by mouth every 8 (eight) hours as needed for spasms. 09/09/20  Yes Nandigam, Venia Minks, MD  estradiol (VIVELLE-DOT) 0.075 MG/24HR Place onto the skin. 01/18/21  Yes [provider]  EVENING PRIMROSE OIL PO Take by mouth.   Yes [provider]  fexofenadine-pseudoephedrine (ALLEGRA-D 24) 180-240 MG per 24 hr tablet Take 1 tablet by mouth daily.   Yes [provider]  fluconazole (DIFLUCAN) 150 MG tablet Take 1 tablet (150 mg total) by mouth daily. Take one tablet today.  May repeat in 3 days. 04/02/21  Yes Sharion Balloon, NP  fluticasone (FLONASE) 50 MCG/ACT nasal spray Place 2 sprays into the nose daily.   Yes [provider]  gentamicin (GARAMYCIN) 40 MG/ML injection Place 40 mg into the nose as needed. Reported on 09/09/2015 09/01/15  Yes [provider]  lansoprazole (PREVACID) 15 MG capsule Take 15 mg by mouth daily.   Yes [provider]  trimethoprim-polymyxin b (POLYTRIM) ophthalmic solution Place 1 drop into both eyes 4 (four) times daily for 7 days. 04/02/21 04/09/21 Yes Sharion Balloon, NP  TURMERIC CURCUMIN PO Take by mouth.   Yes [provider]  vitamin B-12 (CYANOCOBALAMIN) 1000 MCG tablet Take 1,000 mcg by mouth daily.   Yes [provider]  vitamin E 1000 UNIT capsule Take 1,000 Units by mouth daily.   Yes [provider]  zinc gluconate 50 MG tablet Take 50 mg by mouth daily.   Yes [provider]  amoxicillin-clavulanate (AUGMENTIN) 875-125 MG tablet Take 1 tablet by mouth 2 (two) times daily. X 7 days 03/17/21   Melynda Ripple, MD  ibuprofen (ADVIL) 600 MG tablet Take 1 tablet (600 mg total) by mouth every 6 (six) hours as needed.  03/17/21   Melynda Ripple, MD  ondansetron (ZOFRAN) 4 MG tablet Take 1 tablet (4 mg total) by mouth every 8 (eight) hours as needed for nausea or vomiting. 09/09/20   Mauri Pole, MD  sennosides-docusate sodium (SENOKOT-S) 8.6-50 MG tablet Take 1 tablet by mouth daily.    [provider]  tetrahydrozoline-zinc (VISINE-AC) 0.05-0.25 % ophthalmic solution Place 2 drops into both eyes 3 (three) times daily as needed.    [provider]  valACYclovir (VALTREX) 1000 MG tablet Take 2 tablets at onset of cold sore.  Repeat once in 12 hours (4 pills/course) Patient taking differently: Take 2 tablets at onset of cold sore.  Repeat  once in 12 hours (4 pills/course) 05/15/19   Rita Ohara, MD    Family History Family History  Problem Relation Age of Onset   Diabetes Mother    Hypertension Mother    Irritable bowel syndrome Mother    Alzheimer's disease Father 49   Hypertension Maternal Grandmother    Hyperlipidemia Maternal Grandmother    Stroke Maternal Grandfather    Diabetes Maternal Grandfather    Diabetes Paternal Grandmother    Alzheimer's disease Paternal Grandmother    Heart disease Paternal Grandfather 60       died of MI   Turner syndrome Daughter    Depression Daughter    Colon cancer Other        mggf   Cancer Neg Hx    Esophageal cancer Neg Hx    Stomach cancer Neg Hx    Colon polyps Neg Hx     Social History Social History   Tobacco Use   Smoking status: Never   Smokeless tobacco: Never  Vaping Use   Vaping Use: Never used  Substance Use Topics   Alcohol use: No   Drug use: No     Allergies   Sulfa antibiotics   Review of Systems Review of Systems  Constitutional:  Negative for chills and fever.  HENT:  Negative for ear pain and sore throat.   Eyes:  Positive for discharge, redness and itching. Negative for pain and visual disturbance.  Respiratory:  Negative for cough and shortness of breath.   Cardiovascular:  Negative for chest  pain and palpitations.  Gastrointestinal:  Negative for abdominal pain, diarrhea and vomiting.  Genitourinary:  Negative for dysuria, flank pain, hematuria, pelvic pain and vaginal discharge.  Skin:  Negative for color change and rash.  All other systems reviewed and are negative.   Physical Exam Triage Vital Signs ED Triage Vitals  Enc Vitals Group     BP 04/02/21 0839 140/87     Pulse Rate 04/02/21 0839 82     Resp --      Temp 04/02/21 0839 98.3 F (36.8 C)     Temp Source 04/02/21 0839 Oral     SpO2 04/02/21 0839 98 %     Weight --      Height --      Head Circumference --      Peak Flow --      Pain Score 04/02/21 0842 7     Pain Loc --      Pain Edu? --      Excl. in [place]? --    No data found.  Updated Vital Signs BP 140/87 (BP Location: Left Arm)   Pulse 82   Temp 98.3 F (36.8 C) (Oral)   LMP 01/10/2013   SpO2 98%   Visual Acuity Right Eye Distance:   Left Eye Distance:   Bilateral Distance:    Right Eye Near:   Left Eye Near:    Bilateral Near:     Physical Exam Vitals and nursing note reviewed.  Constitutional:      General: She is not in acute distress.    Appearance: She is well-developed. She is not ill-appearing.  HENT:     Head: Normocephalic and atraumatic.     Right Ear: Tympanic membrane normal.     Left Ear: Tympanic membrane normal.     Nose: Nose normal.     Mouth/Throat:     Mouth: Mucous membranes are moist.     Pharynx: Oropharynx is clear.  Eyes:     General: Lids are normal.     Extraocular Movements: Extraocular movements intact.     Conjunctiva/sclera:     Right eye: Right conjunctiva is injected.     Left eye: Left conjunctiva is injected.     Pupils: Pupils are equal, round, and reactive to light.  Cardiovascular:     Rate and Rhythm: Normal rate and regular rhythm.     Heart sounds: Normal heart sounds.  Pulmonary:     Effort: Pulmonary effort is normal. No respiratory distress.     Breath sounds: Normal breath  sounds.  Abdominal:     Palpations: Abdomen is soft.     Tenderness: There is no abdominal tenderness.  Musculoskeletal:     Cervical back: Neck supple.  Skin:    General: Skin is warm and dry.  Neurological:     General: No focal deficit present.     Mental Status: She is alert and oriented to person, place, and time.     Gait: Gait normal.  Psychiatric:        Mood and Affect: Mood normal.        Behavior: Behavior normal.     UC Treatments / Results  Labs (all labs ordered are listed, but only abnormal results are displayed) Labs Reviewed - No data to display  EKG   Radiology No results found.  Procedures Procedures (including critical care time)  Medications Ordered in UC Medications - No data to display  Initial Impression / Assessment and Plan / UC Course  I have reviewed the triage vital signs and the nursing notes.  Pertinent labs & imaging results that were available during my care of the patient were reviewed by me and considered in my medical decision making (see chart for details).  Bilateral conjunctivitis.  Vaginal irritation.  Treating with Polytrim eyedrops.  Instructed patient to follow-up with her eye care provider if her symptoms are not improving.  ED precautions discussed.  Education provided on conjunctivitis. Patient reports vaginal irritation from taking back-to-back antibiotics.  She declines vaginal swab today.  Treating with Diflucan.  Instructed her to follow-up with her PCP if her symptoms are not improving.  She agrees to plan of care.   Final Clinical Impressions(s) / UC Diagnoses   Final diagnoses:  Conjunctivitis of both eyes, unspecified conjunctivitis type  Vaginal irritation     Discharge Instructions      Use the antibiotic eyedrops as prescribed.  Follow-up with your eye doctor for a recheck in 1 to 2 days if your symptoms are not improving.  Go to the emergency department if you have acute eye pain, changes in your vision,  or other concerning symptoms.    Take the Diflucan as directed.  Follow up with your primary care provider if your symptoms are not improving.         ED Prescriptions     Medication Sig Dispense Auth. Provider   fluconazole (DIFLUCAN) 150 MG tablet Take 1 tablet (150 mg total) by mouth daily. Take one tablet today.  May repeat in 3 days. 2 tablet Sharion Balloon, NP   trimethoprim-polymyxin b (POLYTRIM) ophthalmic solution Place 1 drop into both eyes 4 (four) times daily for 7 days. 10 mL Sharion Balloon, NP      PDMP not reviewed this encounter.   Sharion Balloon, NP 04/02/21 [phone]

## 2021-04-02 NOTE — ED Triage Notes (Signed)
Pt was seen at the clinic for a sinus infection on 9/7. She continues to have watery eyes and HA.

## 2021-05-27 ENCOUNTER — Ambulatory Visit
Admission: EM | Admit: 2021-05-27 | Discharge: 2021-05-27 | Disposition: A | Discharge status: Home / Self Care | Attending: Emergency Medicine | Admitting: Emergency Medicine

## 2021-05-27 ENCOUNTER — Encounter: Payer: Self-pay | Admitting: Emergency Medicine

## 2021-05-27 DIAGNOSIS — J069 Acute upper respiratory infection, unspecified: Secondary | ICD-10-CM | POA: Diagnosis not present

## 2021-05-27 LAB — POCT RAPID STREP A (OFFICE): Rapid Strep A Screen: NEGATIVE

## 2021-05-27 MED ORDER — PREDNISONE 10 MG PO TABS: ORAL_TABLET | ORAL | 0 refills | Status: AC

## 2021-05-27 NOTE — ED Provider Notes (Signed)
CHIEF COMPLAINT:   Chief Complaint  Patient presents with   Sore Throat   Nasal Congestion   Cough   Headache     SUBJECTIVE/HPI:   Sore Throat Associated symptoms include headaches.  Cough Associated symptoms: headaches   Headache Associated symptoms: cough   A very pleasant 50 y.o.Female presents today with sore throat, body aches, cough, sinus pressure, runny nose, headache and chills for the last 5 days.. Patient does not report any shortness of breath, chest pain, palpitations, visual changes, weakness, tingling, nausea, vomiting, diarrhea, fever.   has a past medical history of Abnormal mammogram of right breast (01/06/2017), Epigastric pain ([facility],2725), Hemorrhagic ovarian cyst (08/2015), IBS (irritable bowel syndrome), Iron deficiency anemia (2014), Meniere disease (2001), PMDD (premenstrual dysphoric disorder), PONV (postoperative nausea and vomiting), and Seasonal allergies.  ROS:  Review of Systems  Respiratory:  Positive for cough.   Neurological:  Positive for headaches.  See Subjective/HPI Medications, Allergies and Problem List personally reviewed in Epic today OBJECTIVE:   Vitals:   05/27/21 0912  BP: 133/85  Pulse: 99  Resp: 16  Temp: 98.7 F (37.1 C)  SpO2: 98%    Physical Exam   General: Appears well-developed and well-nourished. No acute distress.  HEENT Head: Normocephalic and atraumatic. Ears: Hearing grossly intact, no drainage or visible deformity.  Nose: No nasal deviation.   Mouth/Throat: No stridor or tracheal deviation.  Non erythematous posterior pharynx noted with clear drainage present.  No white patchy exudate noted. Eyes: Conjunctivae and EOM are normal. No eye drainage or scleral icterus bilaterally.  Neck: Normal range of motion, neck is supple. Cardiovascular: Normal rate. Regular rhythm; no murmurs, gallops, or rubs.  Pulm/Chest: No respiratory distress. Breath sounds normal bilaterally without wheezes, rhonchi, or rales.  Intermittent forceful cough noted. Neurological: Alert and oriented to person, place, and time.  Skin: Skin is warm and dry.  No rashes, lesions, abrasions or bruising noted to skin.   Psychiatric: Normal mood, affect, behavior, and thought content.   Vital signs and nursing note reviewed.   Patient stable and cooperative with examination. PROCEDURES:    LABS/X-RAYS/EKG/MEDS:   Results for orders placed or performed during the hospital encounter of 05/27/21  POCT rapid strep A  Result Value Ref Range   Rapid Strep A Screen Negative Negative    MEDICAL DECISION MAKING:   Patient presents with sore throat, body aches, cough, sinus pressure, runny nose, headache and chills for the last 5 days.. Patient does not report any shortness of breath, chest pain, palpitations, visual changes, weakness, tingling, nausea, vomiting, diarrhea, fever.  Given symptoms along with assessment findings, likely viral URI with cough.  Rx prednisone to the patient's preferred pharmacy as she does report some sinus pressure.  Strep negative. Advised rest, fluids, Tylenol, ibuprofen, Robitussin or Nyquil.  Follow-up with PCP if not improving over the next 5 to 7 days.  Return for any new high fever not improved with medications, chest pain, difficulty breathing, nonstop vomiting or coughing up blood.   Patient verbalized understanding and agreed with treatment plan.  Patient stable upon discharge. ASSESSMENT/PLAN:  1. Viral URI with cough - predniSONE (DELTASONE) 10 MG tablet; Take 6 tablets (60 mg total) by mouth daily for 1 day, THEN 5 tablets (50 mg total) daily for 1 day, THEN 4 tablets (40 mg total) daily for 1 day, THEN 3 tablets (30 mg total) daily for 1 day, THEN 2 tablets (20 mg total) daily for 1 day, THEN 1 tablet (10 mg  total) daily for 1 day.  Dispense: 21 tablet; Refill: 0 Instructions about new medications and side effects provided.  Plan:   Discharge Instructions      Take prednisone as  prescribed.  For most people this is a self-limiting process and can take anywhere from 7 - 10 days to start feeling better. A cough can last up to 3 weeks. Pay special attention to handwashing as this can help prevent the spread of the virus.   Always read the labels of cough and cold medications as they may contain some of the ingredients below.  Rest, push lots of fluids (especially water), and utilize supportive care for symptoms. You may take acetaminophen (Tylenol) every 4-6 hours and ibuprofen every 6-8 hours for muscle pain, joint pain, headaches (you may also alternate these medications). Take Robitussin or Nyquil as directed for cough Flonase nasal spray can help alleviate congestion and sinus pressure. Many patients choose Afrin as a nasal decongestant; do not use for more than 3 days for risk of rebound (increased symptoms after stopping medication).  Saline nasal sprays or rinses can also help nasal congestion (use bottled or sterile water). Warm tea with lemon and honey can sooth sore throat and cough, as can cough drops.   Return to clinic for high fever not improving with medications, chest pain, difficulty breathing, non-stop vomiting, or coughing blood. Follow-up with your primary care provider if symptoms do not improve as expected in the next 5-7 days.          Lauren Blanchard, [place] 05/27/21 [phone]

## 2021-05-27 NOTE — Discharge Instructions (Addendum)
Take prednisone as prescribed.  For most people this is a self-limiting process and can take anywhere from 7 - 10 days to start feeling better. A cough can last up to 3 weeks. Pay special attention to handwashing as this can help prevent the spread of the virus.   Always read the labels of cough and cold medications as they may contain some of the ingredients below.  Rest, push lots of fluids (especially water), and utilize supportive care for symptoms. You may take acetaminophen (Tylenol) every 4-6 hours and ibuprofen every 6-8 hours for muscle pain, joint pain, headaches (you may also alternate these medications). Take Robitussin or Nyquil as directed for cough Flonase nasal spray can help alleviate congestion and sinus pressure. Many patients choose Afrin as a nasal decongestant; do not use for more than 3 days for risk of rebound (increased symptoms after stopping medication).  Saline nasal sprays or rinses can also help nasal congestion (use bottled or sterile water). Warm tea with lemon and honey can sooth sore throat and cough, as can cough drops.   Return to clinic for high fever not improving with medications, chest pain, difficulty breathing, non-stop vomiting, or coughing blood. Follow-up with your primary care provider if symptoms do not improve as expected in the next 5-7 days.

## 2021-05-27 NOTE — ED Triage Notes (Signed)
Pt c/o ST, cough, runny nose, HA and chills x 5 days.

## 2021-05-28 ENCOUNTER — Ambulatory Visit (AMBULATORY_SURGERY_CENTER)

## 2021-05-28 ENCOUNTER — Other Ambulatory Visit: Payer: Self-pay

## 2021-05-28 VITALS — Ht 66.0 in | Wt 140.0 lb

## 2021-05-28 DIAGNOSIS — Z1211 Encounter for screening for malignant neoplasm of colon: Secondary | ICD-10-CM

## 2021-05-28 MED ORDER — ONDANSETRON HCL 4 MG PO TABS: 4.0000 mg | ORAL_TABLET | Freq: Three times a day (TID) | ORAL | 0 refills | Status: DC | PRN

## 2021-05-28 NOTE — Progress Notes (Signed)
Pre visit completed via phone call; Patient verified name, DOB, and address; No egg or soy allergy known to patient  No issues known to pt with past sedation with any surgeries or procedures---other than PONV Patient denies ever being told they had issues or difficulty with intubation ----other than PONV No FH of Malignant Hyperthermia Pt is not on diet pills Pt is not on home 02  Pt is not on blood thinners  Pt reports issues with constipation - IBS-C- tries to exercise 3x weekly, increases water, takes Amitiza and stool softener daily No A fib or A flutter NO PA's for preps discussed with pt in PV today  Discussed with pt there will be an out-of-pocket cost for prep and that varies from $0 to 70 + dollars - pt verbalized understanding  Due to the COVID-19 pandemic we are asking patients to follow certain guidelines in PV and the [facility]   Pt aware of COVID protocols and [place] guidelines    Patient reports she feels like the Zofran "makes me more constipated and I don't know if I will take it or not"; RX sent in just in case patient needs/wants medication for nausea

## 2021-06-06 ENCOUNTER — Encounter: Payer: Self-pay | Admitting: Family Medicine

## 2021-06-07 MED ORDER — ALPRAZOLAM 0.25 MG PO TABS: 0.2500 mg | ORAL_TABLET | Freq: Three times a day (TID) | ORAL | 0 refills | Status: DC | PRN

## 2021-06-09 ENCOUNTER — Telehealth: Payer: Self-pay

## 2021-06-09 ENCOUNTER — Encounter: Payer: Self-pay | Admitting: Gastroenterology

## 2021-06-09 NOTE — Telephone Encounter (Signed)
Patient is advised.  

## 2021-06-09 NOTE — Telephone Encounter (Signed)
The patient will be taking Xanax prior to her colonoscopy due to her anxiety. Dr Tomi Bamberger has given her specific instructions for how she is to take it. The patient wants to check with you on this as well.  "Lauren Ohara, MD to Lauren Brownie "Cindy"    1:14 PM I sent in the 0.25mg  tablets.  Take 2 about half hour prior to procedure.  You may take an additional one, if needed.  I sent in 4 tablets total, but suspect you'll only take 2-3."  Okay to take as directed?

## 2021-06-09 NOTE — Telephone Encounter (Signed)
We do use Propofol which is also anxiolytic so she may not need to take additional meds, only use them if needed

## 2021-06-11 ENCOUNTER — Other Ambulatory Visit: Payer: Self-pay

## 2021-06-11 ENCOUNTER — Encounter

## 2021-06-11 ENCOUNTER — Encounter: Payer: Self-pay | Admitting: Gastroenterology

## 2021-06-11 ENCOUNTER — Ambulatory Visit (AMBULATORY_SURGERY_CENTER): Admitting: Gastroenterology

## 2021-06-11 VITALS — BP 123/79 | HR 70 | Temp 98.2°F | Resp 12 | Ht 66.0 in | Wt 140.0 lb

## 2021-06-11 DIAGNOSIS — D124 Benign neoplasm of descending colon: Secondary | ICD-10-CM

## 2021-06-11 DIAGNOSIS — D120 Benign neoplasm of cecum: Secondary | ICD-10-CM

## 2021-06-11 DIAGNOSIS — D122 Benign neoplasm of ascending colon: Secondary | ICD-10-CM

## 2021-06-11 DIAGNOSIS — Z1211 Encounter for screening for malignant neoplasm of colon: Secondary | ICD-10-CM | POA: Diagnosis present

## 2021-06-11 MED ORDER — SODIUM CHLORIDE 0.9 % IV SOLN: 500.0000 mL | Freq: Once | INTRAVENOUS | Status: DC

## 2021-06-11 NOTE — Op Note (Signed)
[facility] Patient Name: Lauren Blanchard Procedure Date: 06/11/2021 1:22 PM MRN: [id number] Endoscopist: Mauri Pole , MD Age: 50 Referring MD:  Date of Birth: 12/26/1970 Gender: Female Account #: [id number] Procedure:                Colonoscopy Indications:              Screening for colorectal malignant neoplasm Medicines:                Propofol per Anesthesia Procedure:                Pre-Anesthesia Assessment:                           - Prior to the procedure, a History and Physical                            was performed, and patient medications and                            allergies were reviewed. The patient's tolerance of                            previous anesthesia was also reviewed. The risks                            and benefits of the procedure and the sedation                            options and risks were discussed with the patient.                            All questions were answered, and informed consent                            was obtained. Prior Anticoagulants: The patient has                            taken no previous anticoagulant or antiplatelet                            agents. ASA Grade Assessment: II - A patient with                            mild systemic disease. After reviewing the risks                            and benefits, the patient was deemed in                            satisfactory condition to undergo the procedure.                           After obtaining informed consent, the colonoscope  was passed under direct vision. Throughout the                            procedure, the patient's blood pressure, pulse, and                            oxygen saturations were monitored continuously. The                            Olympus PCF-[id number] (#[id number]) Colonoscope was                            introduced through the anus and advanced to the the                            cecum,  identified by appendiceal orifice and                            ileocecal valve. The colonoscopy was performed                            without difficulty. The patient tolerated the                            procedure well. The quality of the bowel                            preparation was good. The ileocecal valve,                            appendiceal orifice, and rectum were photographed. Scope In: 1:33:20 PM Scope Out: 1:56:07 PM Scope Withdrawal Time: 0 hours 16 minutes 58 seconds  Total Procedure Duration: 0 hours 22 minutes 47 seconds  Findings:                 The perianal and digital rectal examinations were                            normal.                           Five sessile polyps were found in the descending                            colon X 1, ascending colon X2 and cecum X2 . The                            polyps were 4 to 7 mm in size. These polyps were                            removed with a cold snare. Resection and retrieval                            were complete.  A few small and large-mouthed diverticula were                            found in the sigmoid colon.                           Non-bleeding external and internal hemorrhoids were                            found during retroflexion. The hemorrhoids were                            large. Complications:            No immediate complications. Estimated Blood Loss:     Estimated blood loss was minimal. Impression:               - Five 4 to 7 mm polyps in the descending colon, in                            the ascending colon and in the cecum, removed with                            a cold snare. Resected and retrieved.                           - Diverticulosis in the sigmoid colon.                           - Non-bleeding external and internal hemorrhoids. Recommendation:           - Patient has a contact number available for                            emergencies. The  signs and symptoms of potential                            delayed complications were discussed with the                            patient. Return to normal activities tomorrow.                            Written discharge instructions were provided to the                            patient.                           - Resume previous diet.                           - Continue present medications.                           - Await pathology results.                           -  Repeat colonoscopy in 3 - 5 years for                            surveillance based on pathology results. Mauri Pole, MD 06/11/2021 2:02:42 PM This report has been signed electronically.

## 2021-06-11 NOTE — Progress Notes (Signed)
Transported to PACU, VSS 

## 2021-06-11 NOTE — Progress Notes (Signed)
[facility] History and Physical   Primary Care Physician:  Rita Ohara, MD   Reason for Procedure:  Colorectal cancer screening  Plan:    Screening colonoscopy with possible interventions as needed     HPI: Lauren Blanchard is a very pleasant 50 y.o. female here for screening colonoscopy. Denies any nausea, vomiting, abdominal pain, melena or bright red blood per rectum  The risks and benefits as well as alternatives of endoscopic procedure(s) have been discussed and reviewed. All questions answered. The patient agrees to proceed.  Last exam was incomplete due to inadequate poor bowel prep  Past Medical History:  Diagnosis Date   Abnormal mammogram of right breast 01/06/2017   Epigastric pain 09/2012   Takes Prevacid (pain resolved)   GERD (gastroesophageal reflux disease)    on meds   Hemorrhagic ovarian cyst 08/2015   right   IBS (irritable bowel syndrome)    constipation predominant   Iron deficiency anemia 2014   hx of   Meniere disease 2001   chronic tinnitus; no vertigo since limiting sodium intake   PMDD (premenstrual dysphoric disorder)    hx of   PONV (postoperative nausea and vomiting)    has had PONV with most of her past surgeries   Seasonal allergies     Past Surgical History:  Procedure Laterality Date   ABDOMINOPLASTY  06/2019   BILATERAL SALPINGECTOMY Bilateral 01/17/2013   Procedure: BILATERAL SALPINGECTOMY;  Surgeon: Allyn Kenner, DO;  Location: [facility];  Service: Gynecology;  Laterality: Bilateral;   BREAST LUMPECTOMY WITH RADIOACTIVE SEED LOCALIZATION Right 01/06/2017   Procedure: RIGHT BREAST LUMPECTOMY WITH RADIOACTIVE SEED LOCALIZATION;  Surgeon: Fanny Skates, MD;  Location: [facility];  Service: General;  Laterality: Right;   CESAREAN SECTION     x2: 2001 and 2004   COLONOSCOPY     at age 39 per pt-very painful procedure and painful air afterwards   COLONOSCOPY  02/2021   poor prep-constipation   CYSTOSCOPY   01/17/2013   Procedure: CYSTOSCOPY;  Surgeon: Allyn Kenner, DO;  Location: [facility];  Service: Gynecology;;   LEEP  [place]  2007   NASAL SINUS SURGERY     x3   OTHER SURGICAL HISTORY  2006   ROBOTIC ASSISTED TOTAL HYSTERECTOMY N/A 01/17/2013   Procedure: ROBOTIC ASSISTED TOTAL HYSTERECTOMY;  Surgeon: Allyn Kenner, DO;  Location: [facility];  Service: Gynecology;  Laterality: N/A;   TUBAL LIGATION  2004   WISDOM TOOTH EXTRACTION      Prior to Admission medications   Medication Sig Start Date End Date Taking? Authorizing Provider  ALPRAZolam (XANAX) 0.25 MG tablet Take 1-2 tablets (0.25-0.5 mg total) by mouth 3 (three) times daily as needed for anxiety. 06/07/21  Yes Rita Ohara, MD  AMITIZA 8 MCG capsule TAKE 1 CAPSULE TWICE A DAY WITH MEALS 12/11/20  Yes Rita Ohara, MD  Ascorbic Acid (VITAMIN C) 1000 MG tablet Take 4,000 mg by mouth daily.   Yes [provider]  azelastine (ASTELIN) 0.1 % nasal spray Place 1 spray into both nostrils daily at 6 (six) AM. 05/11/21  Yes [provider]  azelastine (OPTIVAR) 0.05 % ophthalmic solution Apply 1 drop to eye 2 (two) times daily. 05/02/21  Yes [provider]  b complex vitamins capsule Take 2 capsules by mouth daily.   Yes [provider]  BIOTIN PO Take 6,000 mg by mouth daily.   Yes [provider]  cholecalciferol (VITAMIN D) 1000 UNITS tablet Take  3,000 Units by mouth daily.   Yes [provider]  conjugated estrogens (PREMARIN) vaginal cream Place 1 Applicatorful vaginally once a week.   Yes [provider]  estradiol (VIVELLE-DOT) 0.075 MG/24HR Place 1 patch onto the skin 2 (two) times a week. 01/18/21  Yes [provider]  EVENING PRIMROSE OIL PO Take 300 mg by mouth daily at 6 (six) AM.   Yes [provider]  fexofenadine-pseudoephedrine (ALLEGRA-D 24) 180-240 MG per 24 hr tablet Take 1 tablet by mouth daily.   Yes [provider]   fluticasone (FLONASE) 50 MCG/ACT nasal spray Place 2 sprays into the nose daily.   Yes [provider]  ibuprofen (ADVIL) 600 MG tablet Take 1 tablet (600 mg total) by mouth every 6 (six) hours as needed. 03/17/21  Yes Melynda Ripple, MD  lansoprazole (PREVACID) 15 MG capsule Take 15 mg by mouth daily.   Yes [provider]  sennosides-docusate sodium (SENOKOT-S) 8.6-50 MG tablet Take 1 tablet by mouth daily.   Yes [provider]  TURMERIC CURCUMIN PO Take 3 capsules by mouth daily at 6 (six) AM.   Yes [provider]  vitamin B-12 (CYANOCOBALAMIN) 1000 MCG tablet Take 1,000 mcg by mouth daily.   Yes [provider]  vitamin E 1000 UNIT capsule Take 1,000 Units by mouth daily.   Yes [provider]  zinc gluconate 50 MG tablet Take 50 mg by mouth daily.   Yes [provider]  dicyclomine (BENTYL) 10 MG capsule Take 1 capsule (10 mg total) by mouth every 8 (eight) hours as needed for spasms. Patient not taking: Reported on 05/28/2021 09/09/20   Mauri Pole, MD  estradiol (ESTRACE) 0.1 MG/GM vaginal cream Place 1 Applicatorful vaginally once a week. Patient not taking: Reported on 06/11/2021 05/11/21   [provider]  fluconazole (DIFLUCAN) 150 MG tablet Take 1 tablet (150 mg total) by mouth daily. Take one tablet today.  May repeat in 3 days. Patient not taking: Reported on 06/11/2021 04/02/21   Sharion Balloon, NP  gentamicin (GARAMYCIN) 40 MG/ML injection Place 40 mg into the nose as needed. Reported on 09/09/2015 Patient not taking: Reported on 06/11/2021 09/01/15   [provider]  ondansetron (ZOFRAN) 4 MG tablet Take 1 tablet (4 mg total) by mouth every 8 (eight) hours as needed for nausea or vomiting. Take as directed on prep instructions Patient not taking: Reported on 06/11/2021 05/28/21   Mauri Pole, MD  sodium chloride (PF) 0.9 % SOLN 4 mL with gentamicin (PF) 10 MG/ML SOLN 10 mg daily as  needed. Patient not taking: Reported on 06/11/2021 05/10/21   [provider]  tetrahydrozoline-zinc (VISINE-AC) 0.05-0.25 % ophthalmic solution Place 2 drops into both eyes 3 (three) times daily as needed.    [provider]  trimethoprim-polymyxin b (POLYTRIM) ophthalmic solution Place 1 drop into both eyes daily at 6 (six) AM. Patient not taking: Reported on 06/11/2021 04/02/21   [provider]  valACYclovir (VALTREX) 1000 MG tablet Take 2 tablets at onset of cold sore.  Repeat once in 12 hours (4 pills/course) Patient not taking: Reported on 06/11/2021 05/15/19   Rita Ohara, MD    Current Outpatient Medications  Medication Sig Dispense Refill   ALPRAZolam (XANAX) 0.25 MG tablet Take 1-2 tablets (0.25-0.5 mg total) by mouth 3 (three) times daily as needed for anxiety. 4 tablet 0   AMITIZA 8 MCG capsule TAKE 1 CAPSULE TWICE A DAY WITH MEALS 180 capsule 1  Ascorbic Acid (VITAMIN C) 1000 MG tablet Take 4,000 mg by mouth daily.     azelastine (ASTELIN) 0.1 % nasal spray Place 1 spray into both nostrils daily at 6 (six) AM.     azelastine (OPTIVAR) 0.05 % ophthalmic solution Apply 1 drop to eye 2 (two) times daily.     b complex vitamins capsule Take 2 capsules by mouth daily.     BIOTIN PO Take 6,000 mg by mouth daily.     cholecalciferol (VITAMIN D) 1000 UNITS tablet Take 3,000 Units by mouth daily.     conjugated estrogens (PREMARIN) vaginal cream Place 1 Applicatorful vaginally once a week.     estradiol (VIVELLE-DOT) 0.075 MG/24HR Place 1 patch onto the skin 2 (two) times a week.     EVENING PRIMROSE OIL PO Take 300 mg by mouth daily at 6 (six) AM.     fexofenadine-pseudoephedrine (ALLEGRA-D 24) 180-240 MG per 24 hr tablet Take 1 tablet by mouth daily.     fluticasone (FLONASE) 50 MCG/ACT nasal spray Place 2 sprays into the nose daily.     ibuprofen (ADVIL) 600 MG tablet Take 1 tablet (600 mg total) by mouth every 6 (six) hours as needed. 30 tablet 0   lansoprazole  (PREVACID) 15 MG capsule Take 15 mg by mouth daily.     sennosides-docusate sodium (SENOKOT-S) 8.6-50 MG tablet Take 1 tablet by mouth daily.     TURMERIC CURCUMIN PO Take 3 capsules by mouth daily at 6 (six) AM.     vitamin B-12 (CYANOCOBALAMIN) 1000 MCG tablet Take 1,000 mcg by mouth daily.     vitamin E 1000 UNIT capsule Take 1,000 Units by mouth daily.     zinc gluconate 50 MG tablet Take 50 mg by mouth daily.     dicyclomine (BENTYL) 10 MG capsule Take 1 capsule (10 mg total) by mouth every 8 (eight) hours as needed for spasms. (Patient not taking: Reported on 05/28/2021) 90 capsule 3   estradiol (ESTRACE) 0.1 MG/GM vaginal cream Place 1 Applicatorful vaginally once a week. (Patient not taking: Reported on 06/11/2021)     fluconazole (DIFLUCAN) 150 MG tablet Take 1 tablet (150 mg total) by mouth daily. Take one tablet today.  May repeat in 3 days. (Patient not taking: Reported on 06/11/2021) 2 tablet 0   gentamicin (GARAMYCIN) 40 MG/ML injection Place 40 mg into the nose as needed. Reported on 09/09/2015 (Patient not taking: Reported on 06/11/2021)     ondansetron (ZOFRAN) 4 MG tablet Take 1 tablet (4 mg total) by mouth every 8 (eight) hours as needed for nausea or vomiting. Take as directed on prep instructions (Patient not taking: Reported on 06/11/2021) 2 tablet 0   sodium chloride (PF) 0.9 % SOLN 4 mL with gentamicin (PF) 10 MG/ML SOLN 10 mg daily as needed. (Patient not taking: Reported on 06/11/2021)     tetrahydrozoline-zinc (VISINE-AC) 0.05-0.25 % ophthalmic solution Place 2 drops into both eyes 3 (three) times daily as needed.     trimethoprim-polymyxin b (POLYTRIM) ophthalmic solution Place 1 drop into both eyes daily at 6 (six) AM. (Patient not taking: Reported on 06/11/2021)     valACYclovir (VALTREX) 1000 MG tablet Take 2 tablets at onset of cold sore.  Repeat once in 12 hours (4 pills/course) (Patient not taking: Reported on 06/11/2021) 20 tablet 3   Current Facility-Administered  Medications  Medication Dose Route Frequency Provider Last Rate Last Admin   0.9 %  sodium chloride infusion  500 mL Intravenous Once Marializ Ferrebee,  Venia Minks, MD        Allergies as of 06/11/2021 - Review Complete 06/11/2021  Allergen Reaction Noted   Sulfa antibiotics Rash 05/24/2012    Family History  Problem Relation Age of Onset   Diabetes Mother    Hypertension Mother    Irritable bowel syndrome Mother    Alzheimer's disease Father 74   Hypertension Maternal Grandmother    Hyperlipidemia Maternal Grandmother    Stroke Maternal Grandfather    Diabetes Maternal Grandfather    Diabetes Paternal Grandmother    Alzheimer's disease Paternal Grandmother    Heart disease Paternal Grandfather 77       died of MI   Turner syndrome Daughter    Depression Daughter    Colon cancer Other        mggf   Cancer Neg Hx    Esophageal cancer Neg Hx    Stomach cancer Neg Hx    Colon polyps Neg Hx    Rectal cancer Neg Hx     Social History   Socioeconomic History   Marital status: Married    Spouse name: Tim   Number of children: 3   Years of education: Not on file   Highest education level: Not on file  Occupational History   Occupation: Art therapist  Tobacco Use   Smoking status: Never   Smokeless tobacco: Never  Vaping Use   Vaping Use: Never used  Substance and Sexual Activity   Alcohol use: No   Drug use: No   Sexual activity: Yes    Partners: Male    Birth control/protection: Surgical  Other Topics Concern   Not on file  Social History Narrative   Divorced and re-married.  Lives with 2/3 children, husband.  Oldest daughter is married and living in [place]. 1 dog, Malti-poo  (Ralphie 2018), turtle.  No tobacco exposure.   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   Social Determinants of Health   Financial Resource Strain: Not on file  Food Insecurity: Not on file  Transportation Needs: Not on file   Physical Activity: Not on file  Stress: Not on file  Social Connections: Not on file  Intimate Partner Violence: Not on file    Review of Systems:  All other review of systems negative except as mentioned in the HPI.  Physical Exam: Vital signs in last 24 hours:  BP 128/82   Pulse 85   Temp 98.2 F (36.8 C) (Skin)   Ht 5\' 6"  (1.676 m)   Wt 140 lb (63.5 kg)   LMP 01/10/2013   SpO2 100%   BMI 22.60 kg/m    General:   Alert, NAD Lungs:  Clear .   Heart:  Regular rate and rhythm Abdomen:  Soft, nontender and nondistended. Neuro/Psych:  Alert and cooperative. Normal mood and affect. A and O x 3  Reviewed labs, radiology imaging, old records and pertinent past GI work up  Patient is appropriate for planned procedure(s) and anesthesia in an ambulatory setting   K. Denzil Magnuson , MD [phone]

## 2021-06-11 NOTE — Progress Notes (Signed)
Pt's states no medical or surgical changes since previsit or office visit. VS assessed by C.W 

## 2021-06-11 NOTE — Patient Instructions (Signed)
Handout on polyps and diverticulosis given    YOU HAD AN ENDOSCOPIC PROCEDURE TODAY AT THE [facility]:   Refer to the procedure report that was given to you for any specific questions about what was found during the examination.  If the procedure report does not answer your questions, please call your gastroenterologist to clarify.  If you requested that your care partner not be given the details of your procedure findings, then the procedure report has been included in a sealed envelope for you to review at your convenience later.  YOU SHOULD EXPECT: Some feelings of bloating in the abdomen. Passage of more gas than usual.  Walking can help get rid of the air that was put into your GI tract during the procedure and reduce the bloating. If you had a lower endoscopy (such as a colonoscopy or flexible sigmoidoscopy) you may notice spotting of blood in your stool or on the toilet paper. If you underwent a bowel prep for your procedure, you may not have a normal bowel movement for a few days.  Please Note:  You might notice some irritation and congestion in your nose or some drainage.  This is from the oxygen used during your procedure.  There is no need for concern and it should clear up in a day or so.  SYMPTOMS TO REPORT IMMEDIATELY:   Following lower endoscopy (colonoscopy or flexible sigmoidoscopy):  Excessive amounts of blood in the stool  Significant tenderness or worsening of abdominal pains  Swelling of the abdomen that is new, acute  Fever of 100F or higher   For urgent or emergent issues, a gastroenterologist can be reached at any hour by calling [phone]. Do not use MyChart messaging for urgent concerns.    DIET:  We do recommend a small meal at first, but then you may proceed to your regular diet.  Drink plenty of fluids but you should avoid alcoholic beverages for 24 hours.  ACTIVITY:  You should plan to take it easy for the rest of today and you should NOT  DRIVE or use heavy machinery until tomorrow (because of the sedation medicines used during the test).    FOLLOW UP: Our staff will call the number listed on your records 48-72 hours following your procedure to check on you and address any questions or concerns that you may have regarding the information given to you following your procedure. If we do not reach you, we will leave a message.  We will attempt to reach you two times.  During this call, we will ask if you have developed any symptoms of COVID 19. If you develop any symptoms (ie: fever, flu-like symptoms, shortness of breath, cough etc.) before then, please call [phone].  If you test positive for Covid 19 in the 2 weeks post procedure, please call and report this information to us.    If any biopsies were taken you will be contacted by phone or by letter within the next 1-3 weeks.  Please call us at [phone] if you have not heard about the biopsies in 3 weeks.    SIGNATURES/CONFIDENTIALITY: You and/or your care partner have signed paperwork which will be entered into your electronic medical record.  These signatures attest to the fact that that the information above on your After Visit Summary has been reviewed and is understood.  Full responsibility of the confidentiality of this discharge information lies with you and/or your care-partner. 

## 2021-06-11 NOTE — Progress Notes (Signed)
Called to room to assist during endoscopic procedure.  Patient ID and intended procedure confirmed with present staff. Received instructions for my participation in the procedure from the performing physician.  

## 2021-06-15 ENCOUNTER — Telehealth: Payer: Self-pay | Admitting: *Deleted

## 2021-06-15 NOTE — Patient Instructions (Addendum)
HEALTH MAINTENANCE RECOMMENDATIONS:  It is recommended that you get at least 30 minutes of aerobic exercise at least 5 days/week (for weight loss, you may need as much as 60-90 minutes). This can be any activity that gets your heart rate up. This can be divided in 10-15 minute intervals if needed, but try and build up your endurance at least once a week.  Weight bearing exercise is also recommended twice weekly.  Eat a healthy diet with lots of vegetables, fruits and fiber.  "Colorful" foods have a lot of vitamins (ie green vegetables, tomatoes, red peppers, etc).  Limit sweet tea, regular sodas and alcoholic beverages, all of which has a lot of calories and sugar.  Up to 1 alcoholic drink daily may be beneficial for women (unless trying to lose weight, watch sugars).  Drink a lot of water.  Calcium recommendations are 1200-1500 mg daily (1500 mg for postmenopausal women or women without ovaries), and vitamin D 1000 IU daily.  This should be obtained from diet and/or supplements (vitamins), and calcium should not be taken all at once, but in divided doses.  Monthly self breast exams and yearly mammograms for women over the age of 44 is recommended.  Sunscreen of at least SPF 30 should be used on all sun-exposed parts of the skin when outside between the hours of 10 am and 4 pm (not just when at beach or pool, but even with exercise, golf, tennis, and yard work!)  Use a sunscreen that says "broad spectrum" so it covers both UVA and UVB rays, and make sure to reapply every 1-2 hours.  Remember to change the batteries in your smoke detectors when changing your clock times in the spring and fall. Carbon monoxide detectors are recommended for your home.  Use your seat belt every time you are in a car, and please drive safely and not be distracted with cell phones and texting while driving.  I recommend getting the new shingles vaccine (Shingrix). You may want to check with your insurance to verify what  your out of pocket cost may be (usually covered as preventative, but better to verify to avoid any surprises, as this vaccine is expensive), and then schedule a nurse visit at our office when convenient (based on the possible side effects as discussed).   This is a series of 2 injections, spaced 2 months apart.  It doesn't have to be exactly 2 months apart (but can't be sooner), if that isn't feasible for your schedule, but try and get them close to 2 months (and definitely within 6 months of each other, or else the efficacy of the vaccine drops off). This should be separated from other vaccines by at least 2 weeks.  Keep a headache log--record when you have headaches, where in your head, the severity (scale of 1-10), any triggers (weather front, stress), what you took for it to resolve. If you have ongoing headaches, return for an office visit and bring this log with you to discuss. I would also schedule a routine eye exam (since headaches are at the end of the day, eye strain could contribute).  We are prescribing prednisone to take due to worsening headaches since the flu.  There seems to be a muscular component (whether from stress or clenching), so may come back.  Try and work on these other issues.  Consider looking into NAMI (ask your therapist about this). We may consider medications to help with headaches (pamelor) which can help with sleep and headache  prevention.  Meclizine is available over-the-counter.  Certain forms of Dramamine (N, or less-drowsy, they keep changing the names, read the label), or bonine are two brands.

## 2021-06-15 NOTE — Telephone Encounter (Signed)
  Follow up Call-  Call back number 06/11/2021 02/26/2021  Post procedure Call Back phone  # [phone]  Permission to leave phone message Yes Yes  Some recent data might be hidden     Patient questions:  Do you have a fever, pain , or abdominal swelling? No. Pain Score  0 *  Have you tolerated food without any problems? Yes.    Have you been able to return to your normal activities? Yes.    Do you have any questions about your discharge instructions: Diet   No. Medications  No. Follow up visit  No.  Do you have questions or concerns about your Care? No.  Actions: * If pain score is 4 or above: No action needed, pain <4.  Have you developed a fever since your procedure? no  2.   Have you had an respiratory symptoms (SOB or cough) since your procedure? no  3.   Have you tested positive for COVID 19 since your procedure no  4.   Have you had any family members/close contacts diagnosed with the COVID 19 since your procedure?  no   If yes to any of these questions please route to Joylene John, RN and Joella Prince, RN

## 2021-06-15 NOTE — Progress Notes (Signed)
Chief Complaint  Patient presents with   Annual Exam    Fasting annual exam no pap-sees Dr. Callahan. Had colonoscopy and was found to have diverticulosis and would like to discuss. Does not want flu, covid or shingles vaccines today. Has noticed she is more moody lately but she is not interested in medications. More frequent headaches. Thinks she had a migraine in May-saw ENT and spoke with about it and he didn't really say anything. Since she had the flu the week before Thanksgiving, she has been having sinus pressure related headaches each afternoon.  She   Medication Refill    Needs refill on valtrex.     Lauren Blanchard is a 50 y.o. female who presents for a complete physical.   She has the following concerns:  Going to [place] in July, asking for motion sickness patches.  She has used this in the past without problems.  She needs it for the plane, busses. She will be gone for 2 weeks. She will send a message when closer to get rx.  h/o allergies and sinus infections: she last saw ENT in 03/2021 for issues with watery eyes after having an eye infection (treated with ABX by UC).  She was prescribed Optivar eye drops, and treated with steroid injection. Nasal endoscopy was normal at that visit. She uses this eventually resolved. Flonase and Allegra-D daily, as well as saline sinus rinses daily. She uses gentamycin sinus rinses as needed (with symptoms of infection).  She had the flu before Thanksgiving, having some sinus pressure/headaches since.  She had some discolored mucus when she had the flu.  No longer sees any discoloration, but has a lot of pressure/headaches, worse at the end of the day.HA is across her forehead, extending to temples bilaterally. Sometimes it wraps around the whole head, which often precedes a migraine.  This happened once in the past week, and was relieved by ibuprofen.  Last full migraine was in May or June. Neti-pot has been helping some with her headaches, but the only  thing that makes it go away is ibuprofen and going to bed. She is taking ibuprofen every day recently.  H/o PMDD--She had weaned herself off of Zoloft when she started estradiol patch through her GYN.  Getting regular exercise helps with her moods. Doing well off SSRI. Moods are a little worse, related to stress from her daughter. She is getting regular therapy (every 2 weeks).  She is taking daughter to psych, therapist, equine therapy. Lots of med changes, causing emotional "mess". She is a little better (only a little), causing significant stress for pt.  IBS with constipation--She was doing very well until her recent colonoscopies, taking 16 mcg of amitiza at bedtime, along with pericolace. She has been dealing with some constipation since August (first colonoscopy). The stool softeners help, but admits she sometimes uses up to 4-5. Pain is worse with stress. She was prescribed Bentyl by GI in 09/2020. She hasn't been using it, she forgot she had it. Pain hasn't been too bad. She continues to avoid bread, corn, broccoli which cause more abdominal pain.  GERD, h/o epigastric pain:  She takes Prevacid (OTC) every day, and denies any recurrent epigastric pain, reflux, or heartburn. Denies any dysphagia with food or liquids.  H/o BPV and h/o Meniere's, s/p treatment with neuro PT for BPV flare in the past (1 session).  She had some dizziness in the last year, some after the migraine she had.  Missed work for a couple of   days. She has meclizine, but it is very old, didn't use.  Vit D deficiency:  She is compliant with taking daily D3 1000 IU.   Vitamin D-OH level was normal 09/2017 and in 2017.  She continues on same supplements.  Hyperlipidemia:  Borderline high LDL in the past, with LDL up to 179 in 2019.  In 2020, LDL was significantly improved at 117.  Last year LDL was up to 161.  She had reported having more pepperoni pizza (after Thanksgiving) than is typical for her.  HDL has always been good.    She doesn't eat red meat or eggs, some cheese (not every day). She is due for recheck. She had 5 very small pieces of pepperoni pizza 2 weeks prior to Thanksgiving. Uses 1% milk. Cut back on creamy soups, but still has occasionally. "I like butter"  Component Ref Range & Units 1 yr ago 2 yr ago 3 yr ago 4 yr ago 5 yr ago 6 yr ago 8 yr ago  Cholesterol, Total 100 - 199 mg/dL 260 High   199  283 High        Triglycerides 0 - 149 mg/dL 136  94  220 High   94 R  132 R  156 High  R  74 R   HDL >39 mg/dL 75  65  60  65 R  71 R  73 R, CM  76   VLDL Cholesterol Cal 5 - 40 mg/dL 24  17  44 High        LDL Chol Calc (NIH) 0 - 99 mg/dL 161 High   117 High         Chol/HDL Ratio 0.0 - 4.4 ratio 3.5  3.1 CM  4.7 High  CM  3.7 R  3.5 R       Cold sores--gets 1-2x/year. Uses Valtrex prn.  She takes it as soon as she feels the tingle, and never actually gets a cold sore.  Used it 3-4 times this past year.   Immunization History  Administered Date(s) Administered   Influenza Split 05/24/2012   Influenza,inj,Quad PF,6+ Mos 05/01/2013, 05/15/2019   MMR 06/07/2010, 07/08/2010   Td 05/24/2010   Tdap 09/03/2014   She declines COVID vaccine, has religious exemption.  Refuses flu shot. Last Pap smear: 10/2016, benign, no high risk HPV present; s/p hysterectomy Last mammogram: 01/2021 Last colonoscopy: 06/2021 5 polyps, pathology still pending.  Internal/external hemorrhoids and diverticulosis also noted. Last DEXA: never   Dentist: every 6 months   Ophtho:  not recently Exercise:  3x/week--30 mins of cardio, 30-40 minutes of weights.  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   PMH, PSH, SH and FH were reviewed and updated  Outpatient Encounter Medications as of 06/16/2021  Medication Sig Note   AMITIZA 8 MCG capsule TAKE 1 CAPSULE TWICE A DAY WITH MEALS    Ascorbic Acid (VITAMIN C) 1000 MG tablet Take 4,000 mg by mouth daily.    azelastine  (ASTELIN) 0.1 % nasal spray Place 1 spray into both nostrils daily at 6 (six) AM.    azelastine (OPTIVAR) 0.05 % ophthalmic solution Apply 1 drop to eye 2 (two) times daily.    b complex vitamins capsule Take 2 capsules by mouth daily.    BIOTIN PO Take 6,000 mg by mouth daily.    cholecalciferol (VITAMIN D) 1000 UNITS tablet Take 3,000 Units by mouth daily.    estradiol (ESTRACE)  0.1 MG/GM vaginal cream Place 1 Applicatorful vaginally once a week.    estradiol (VIVELLE-DOT) 0.075 MG/24HR Place 1 patch onto the skin 2 (two) times a week.    EVENING PRIMROSE OIL PO Take 300 mg by mouth daily at 6 (six) AM.    fexofenadine-pseudoephedrine (ALLEGRA-D 24) 180-240 MG per 24 hr tablet Take 1 tablet by mouth daily.    fluticasone (FLONASE) 50 MCG/ACT nasal spray Place 2 sprays into the nose daily.    ibuprofen (ADVIL) 600 MG tablet Take 1 tablet (600 mg total) by mouth every 6 (six) hours as needed. 06/16/2021: Took last night for HA   lansoprazole (PREVACID) 15 MG capsule Take 15 mg by mouth daily.    sennosides-docusate sodium (SENOKOT-S) 8.6-50 MG tablet Take 1 tablet by mouth daily. 06/16/2021: Peri-colace--usually takes 3/night (lately taking 4-5)   tetrahydrozoline-zinc (VISINE-AC) 0.05-0.25 % ophthalmic solution Place 2 drops into both eyes 3 (three) times daily as needed.    TURMERIC CURCUMIN PO Take 3 capsules by mouth daily at 6 (six) AM.    vitamin E 1000 UNIT capsule Take 1,000 Units by mouth daily.    zinc gluconate 50 MG tablet Take 50 mg by mouth daily.    [DISCONTINUED] vitamin B-12 (CYANOCOBALAMIN) 1000 MCG tablet Take 1,000 mcg by mouth daily.    dicyclomine (BENTYL) 10 MG capsule Take 1 capsule (10 mg total) by mouth every 8 (eight) hours as needed for spasms. (Patient not taking: Reported on 05/28/2021) 06/16/2021: As needed   gentamicin (GARAMYCIN) 40 MG/ML injection Place 40 mg into the nose as needed. Reported on 09/09/2015 (Patient not taking: Reported on 06/11/2021) 06/08/2020: Uses  via irrigation prn purulent nasal drainage   ondansetron (ZOFRAN) 4 MG tablet Take 1 tablet (4 mg total) by mouth every 8 (eight) hours as needed for nausea or vomiting. Take as directed on prep instructions (Patient not taking: Reported on 06/11/2021)    valACYclovir (VALTREX) 1000 MG tablet Take 2 tablets at onset of cold sore.  Repeat once in 12 hours (4 pills/course) (Patient not taking: Reported on 06/11/2021)    [DISCONTINUED] ALPRAZolam (XANAX) 0.25 MG tablet Take 1-2 tablets (0.25-0.5 mg total) by mouth 3 (three) times daily as needed for anxiety.    [DISCONTINUED] conjugated estrogens (PREMARIN) vaginal cream Place 1 Applicatorful vaginally once a week. 05/15/2019: Uses once a week   [DISCONTINUED] fluconazole (DIFLUCAN) 150 MG tablet Take 1 tablet (150 mg total) by mouth daily. Take one tablet today.  May repeat in 3 days. (Patient not taking: Reported on 06/11/2021)    [DISCONTINUED] sodium chloride (PF) 0.9 % SOLN 4 mL with gentamicin (PF) 10 MG/ML SOLN 10 mg daily as needed. (Patient not taking: Reported on 06/11/2021) 06/16/2021: Uses when she thinks she has a sinus infection w/Navage machine   [DISCONTINUED] trimethoprim-polymyxin b (POLYTRIM) ophthalmic solution Place 1 drop into both eyes daily at 6 (six) AM. (Patient not taking: Reported on 06/11/2021)    No facility-administered encounter medications on file as of 06/16/2021.   Allergies  Allergen Reactions   Sulfa Antibiotics Rash    ROS: The patient denies anorexia, fever, headaches (just occasional sinus headaches), decreased hearing, breast concerns, chest pain, palpitations, dizziness, syncope, dyspnea on exertion, cough, swelling, vomiting, diarrhea, melena, hematochezia, hematuria, dysuria, vaginal discharge, odor or itch, genital lesions, numbness, tingling, weakness, tremor, suspicious skin lesions, abnormal bleeding/bruising, or enlarged lymph nodes.   Occasional nausea Mild urinary urgency, rare incontinence. + constipation,  chronic, per HPI Chronic tinnitus (both ears), intermittent No hot  flashes, night sweats (on HRT) Some intermittent pain in her knees. Not having as much neck pain. Feeling a little more tired than usual Some cough from PND (a little better in the last few days), persisted since flu.    PHYSICAL EXAM:  BP 128/74   Pulse 76   Ht 5' 5" (1.651 m)   Wt 149 lb (67.6 kg)   LMP 01/10/2013   BMI 24.79 kg/m   Wt Readings from Last 3 Encounters:  06/16/21 149 lb (67.6 kg)  06/11/21 140 lb (63.5 kg)  05/28/21 140 lb (63.5 kg)    General Appearance:    Alert, cooperative, no distress, appears stated age.   Head:    Normocephalic, without obvious abnormality, atraumatic. Tender at temporalis muscles bilaterally    Eyes:    PERRL, conjunctiva/corneas clear, EOM's intact, fundi benign     Ears:    Normal TM's and EAC's  Nose:    Nasal mucosa with mild edema, pale, clear mucus. Mild tender at R frontal sinus  Throat:    Normal, no erythema or lesions  Neck:    Supple, no lymphadenopathy; thyroid: no enlargement/tenderness/nodules; no carotid bruit or JVD. No spinal tenderness.  Back:    Spine nontender, no curvature, ROM normal, no CVA tenderness     Lungs:    Clear to auscultation bilaterally without wheezes, rales or ronchi; respirations unlabored     Chest Wall:    No tenderness or deformity     Heart:    Regular rate and rhythm, S1 and S2 normal, no murmur, rub or gallop     Breast Exam:    Deferred to GYN     Abdomen:    Soft, nondistended, normoactive bowel sounds, no hepatosplenomegaly or mass.  Nontender.  Genitalia:    Deferred to GYN            Extremities:    No clubbing, cyanosis or edema     Pulses:    2+ and symmetric all extremities     Skin:    Skin color, texture, turgor normal, no rashes, though exam is limited, not undressed.  Lymph nodes:    Cervical, supraclavicular nodes normal     Neurologic:    Normal strength, sensation and gait; reflexes 2+ and symmetric  throughout                       Psych:   Normal mood, affect, hygiene and grooming.     Depression screen [facility] 2/9 06/16/2021 06/08/2020 05/15/2019 09/20/2017 09/20/2017  Decreased Interest 1 0 0 2 2  Down, Depressed, Hopeless 1 0 0 1 3  PHQ - 2 Score 2 0 0 3 5  Altered sleeping 1 - - 0 -  Tired, decreased energy 3 - - 3 -  Change in appetite 1 - - 1 -  Feeling bad or failure about yourself  3 - - 1 -  Trouble concentrating 0 - - 0 -  Moving slowly or fidgety/restless 0 - - 0 -  Suicidal thoughts 0 - - 0 -  PHQ-9 Score 10 - - 8 -  Difficult doing work/chores Somewhat difficult - - Not difficult at all -     ASSESSMENT/PLAN:  Annual physical exam - Plan: POCT Urinalysis DIP (Proadvantage Device), Visual acuity screening, CBC with Differential/Platelet, Comprehensive metabolic panel, Lipid panel, TSH  Hypercholesteremia - reviewed low cholesterol diet - Plan: Lipid panel  Polyp of colon, unspecified part of colon, unspecified type -  pathology pending from recent colonoscopy  Irritable bowel syndrome with constipation - Plan: lubiprostone (AMITIZA) 8 MCG capsule, TSH  Gastroesophageal reflux disease without esophagitis - controlled with PPI  Allergic rhinitis, unspecified seasonality, unspecified trigger - Flaring some. Cont current regimen, but will rx short course of steroids given worsening HA. Risks/SE reviewed  Herpes labialis - infrequent - Plan: valACYclovir (VALTREX) 1000 MG tablet  Sinus headache - no e/o infection.  HA's multifactorial--muscular and sinus.  Short course of prednisone - Plan: predniSONE (DELTASONE) 20 MG tablet  Tension headache - workon stress reduction, avoid clenching teeth. ice/heat prn.  Course of prednisone--no NSAID while taking - Plan: predniSONE (DELTASONE) 20 MG tablet  Fatigue, unspecified type - Plan: CBC with Differential/Platelet, Comprehensive metabolic panel, TSH  Stress due to illness of family member - daughter's mental health.  Continue  therapy, consider NAMI/support group  Will do a course of Prednisone for headache--allergic/sinus and muscular components (related to stress and clenching). Encouraged her to see eye doctor and keep HA journal. May need neuro referral and/or imaging if persists/worsens.  Ok for rx of Transderm scopolamine patch #4 when needed--will send message when she needs it (July).   Discussed monthly self breast exams and yearly mammograms; at least 30 minutes of aerobic activity at least 5 days/week, weight-bearing exercise 2-3x/week; proper sunscreen use reviewed; healthy diet, including goals of calcium and vitamin D intake and alcohol recommendations (less than or equal to 1 drink/day) reviewed; regular seatbelt use; changing batteries in smoke detectors. Immunization recommendations discussed-- Yearly flu shots are recommended, refused. Shingrix recommended, risks/SE reviewed; to check insurance and schedule NV if/when desired. COVID vaccine recommended, refused. Colonoscopy is UTD, pathology pending to know when next due.  F/u 1 year, sooner prn

## 2021-06-16 ENCOUNTER — Ambulatory Visit (INDEPENDENT_AMBULATORY_CARE_PROVIDER_SITE_OTHER): Admitting: Family Medicine

## 2021-06-16 ENCOUNTER — Encounter: Payer: Self-pay | Admitting: Family Medicine

## 2021-06-16 ENCOUNTER — Other Ambulatory Visit: Payer: Self-pay

## 2021-06-16 VITALS — BP 128/74 | HR 76 | Ht 65.0 in | Wt 149.0 lb

## 2021-06-16 DIAGNOSIS — E7800 Pure hypercholesterolemia, unspecified: Secondary | ICD-10-CM

## 2021-06-16 DIAGNOSIS — K219 Gastro-esophageal reflux disease without esophagitis: Secondary | ICD-10-CM

## 2021-06-16 DIAGNOSIS — K581 Irritable bowel syndrome with constipation: Secondary | ICD-10-CM | POA: Diagnosis not present

## 2021-06-16 DIAGNOSIS — B001 Herpesviral vesicular dermatitis: Secondary | ICD-10-CM

## 2021-06-16 DIAGNOSIS — Z6379 Other stressful life events affecting family and household: Secondary | ICD-10-CM

## 2021-06-16 DIAGNOSIS — R5383 Other fatigue: Secondary | ICD-10-CM

## 2021-06-16 DIAGNOSIS — Z0000 Encounter for general adult medical examination without abnormal findings: Secondary | ICD-10-CM

## 2021-06-16 DIAGNOSIS — K635 Polyp of colon: Secondary | ICD-10-CM

## 2021-06-16 DIAGNOSIS — G44209 Tension-type headache, unspecified, not intractable: Secondary | ICD-10-CM

## 2021-06-16 DIAGNOSIS — R519 Headache, unspecified: Secondary | ICD-10-CM

## 2021-06-16 DIAGNOSIS — J309 Allergic rhinitis, unspecified: Secondary | ICD-10-CM

## 2021-06-16 LAB — POCT URINALYSIS DIP (PROADVANTAGE DEVICE)
Bilirubin, UA: NEGATIVE
Blood, UA: NEGATIVE
Glucose, UA: NEGATIVE mg/dL
Ketones, POC UA: NEGATIVE mg/dL
Leukocytes, UA: NEGATIVE
Nitrite, UA: NEGATIVE
Protein Ur, POC: NEGATIVE mg/dL
Specific Gravity, Urine: 1.025
Urobilinogen, Ur: NEGATIVE
pH, UA: 6 (ref 5.0–8.0)

## 2021-06-16 MED ORDER — PREDNISONE 20 MG PO TABS: 20.0000 mg | ORAL_TABLET | Freq: Two times a day (BID) | ORAL | 0 refills | Status: DC

## 2021-06-16 MED ORDER — LUBIPROSTONE 8 MCG PO CAPS: ORAL_CAPSULE | ORAL | 3 refills | Status: DC

## 2021-06-16 MED ORDER — VALACYCLOVIR HCL 1 G PO TABS: ORAL_TABLET | ORAL | 3 refills | Status: DC

## 2021-06-17 LAB — CBC WITH DIFFERENTIAL/PLATELET
Basophils Absolute: 0 10*3/uL (ref 0.0–0.2)
Basos: 1 %
EOS (ABSOLUTE): 0.1 10*3/uL (ref 0.0–0.4)
Eos: 1 %
Hematocrit: 37.7 % (ref 34.0–46.6)
Hemoglobin: 12.6 g/dL (ref 11.1–15.9)
Immature Grans (Abs): 0 10*3/uL (ref 0.0–0.1)
Immature Granulocytes: 0 %
Lymphocytes Absolute: 2.2 10*3/uL (ref 0.7–3.1)
Lymphs: 43 %
MCH: 30.4 pg (ref 26.6–33.0)
MCHC: 33.4 g/dL (ref 31.5–35.7)
MCV: 91 fL (ref 79–97)
Monocytes Absolute: 0.6 10*3/uL (ref 0.1–0.9)
Monocytes: 11 %
Neutrophils Absolute: 2.3 10*3/uL (ref 1.4–7.0)
Neutrophils: 44 %
Platelets: 368 10*3/uL (ref 150–450)
RBC: 4.14 x10E6/uL (ref 3.77–5.28)
RDW: 12.5 % (ref 11.7–15.4)
WBC: 5.2 10*3/uL (ref 3.4–10.8)

## 2021-06-17 LAB — COMPREHENSIVE METABOLIC PANEL
ALT: 15 IU/L (ref 0–32)
AST: 15 IU/L (ref 0–40)
Albumin/Globulin Ratio: 1.9 (ref 1.2–2.2)
Albumin: 4.5 g/dL (ref 3.8–4.9)
Alkaline Phosphatase: 59 IU/L (ref 44–121)
BUN/Creatinine Ratio: 23 (ref 9–23)
BUN: 16 mg/dL (ref 6–24)
Bilirubin Total: 0.2 mg/dL (ref 0.0–1.2)
CO2: 27 mmol/L (ref 20–29)
Calcium: 9.7 mg/dL (ref 8.7–10.2)
Chloride: 102 mmol/L (ref 96–106)
Creatinine, Ser: 0.69 mg/dL (ref 0.57–1.00)
Globulin, Total: 2.4 g/dL (ref 1.5–4.5)
Glucose: 84 mg/dL (ref 70–99)
Potassium: 4.8 mmol/L (ref 3.5–5.2)
Sodium: 140 mmol/L (ref 134–144)
Total Protein: 6.9 g/dL (ref 6.0–8.5)
eGFR: 104 mL/min/{1.73_m2} (ref >59)

## 2021-06-17 LAB — LIPID PANEL
Chol/HDL Ratio: 3.6 ratio (ref 0.0–4.4)
Cholesterol, Total: 232 mg/dL — ABNORMAL HIGH (ref 100–199)
HDL: 64 mg/dL (ref >39)
LDL Chol Calc (NIH): 152 mg/dL — ABNORMAL HIGH (ref 0–99)
Triglycerides: 93 mg/dL (ref 0–149)
VLDL Cholesterol Cal: 16 mg/dL (ref 5–40)

## 2021-06-17 LAB — TSH: TSH: 0.771 u[IU]/mL (ref 0.450–4.500)

## 2021-06-22 ENCOUNTER — Encounter: Payer: Self-pay | Admitting: Gastroenterology

## 2021-07-09 ENCOUNTER — Encounter: Admitting: Gastroenterology

## 2021-07-28 ENCOUNTER — Other Ambulatory Visit: Payer: Self-pay | Admitting: *Deleted

## 2021-07-28 ENCOUNTER — Telehealth: Payer: Self-pay

## 2021-07-28 DIAGNOSIS — K581 Irritable bowel syndrome with constipation: Secondary | ICD-10-CM

## 2021-07-28 MED ORDER — LUBIPROSTONE 8 MCG PO CAPS: ORAL_CAPSULE | ORAL | 0 refills | Status: DC

## 2021-07-28 NOTE — Telephone Encounter (Signed)
Approvedtoday CaseId:[id number];Status:Approved;Review Type:Prior Auth;Coverage Start Date:06/28/2021;Coverage End Date:07/28/2022;  Patient notified

## 2021-07-28 NOTE — Telephone Encounter (Signed)
[facility] [place] request for Amitiza, submitted to [employer] via cover my meds. Waiting for coverage determination response

## 2021-07-28 NOTE — Telephone Encounter (Signed)
Patient's mail order is not here yet and she called in a panic asking for a #30 day supply of amitiza to please be called into [facility].

## 2021-10-20 ENCOUNTER — Telehealth: Payer: Self-pay | Admitting: Internal Medicine

## 2021-10-20 DIAGNOSIS — K581 Irritable bowel syndrome with constipation: Secondary | ICD-10-CM

## 2021-10-20 MED ORDER — LUBIPROSTONE 8 MCG PO CAPS: ORAL_CAPSULE | ORAL | 2 refills | Status: DC

## 2021-10-20 NOTE — Telephone Encounter (Signed)
[facility] came in for [facility] Blanchard [facility]. Pt has an appt  in December ?

## 2021-12-27 ENCOUNTER — Encounter: Payer: Self-pay | Admitting: Family Medicine

## 2021-12-27 MED ORDER — SCOPOLAMINE 1 MG/3DAYS TD PT72: 1.0000 | MEDICATED_PATCH | TRANSDERMAL | 0 refills | Status: DC

## 2022-02-07 LAB — HM MAMMOGRAPHY

## 2022-03-16 ENCOUNTER — Encounter: Payer: Self-pay | Admitting: Internal Medicine

## 2022-03-30 ENCOUNTER — Encounter: Payer: Self-pay | Admitting: *Deleted

## 2022-04-14 NOTE — Progress Notes (Signed)
Chief Complaint  Patient presents with   itching     Has been having itching of her skin x 1 month. Usually has itching of her skin every fall. This started way early this year and is much worse. She uses lotion daily. And when it worsens she uses benadryl creams. Also having blisters around her mouth, has used three rounds of valtrex.    She has always had sensitive skin (to detergents), always is careful with her products. She states itching started about 2 months ago, mostly on arms, legs, chest. Itching of the back comes/goes. She avoids hot showers.  She uses Nivea collagen serum/lotion at least 2x/d (when at home), benadryl cream/gel/lotion when is at work, during the day.  It calms it down.  Air blowing on her skin makes it itchier. Today she has Cetaphil lotion with her applying during visit, and frequently rubbing at her arms.  Denies any change in vitamins/supplements. She takes topamax for headaches since last June, no change in dose. No migraines since taking it.  Patient saw Dr. Rogue Bussing last month, reported significant ongoing stress/challenges regarding her daughter Maretta Bees (has Turner's syndrome, had psychotic break). She reported feeling more forgetful, tired, poor sleep, and full body itching. She had said higher doses of estrogen previously helped with some of these symptoms, and Dr. Rogue Bussing increased her estrogen dose.  She reports today that she tried 66m for a couple of weeks, it made her moodiness worse, didn't help as it did in the past, so she went back to the 0.7100mdose. She was asking her about hormonal testing, which was not recommended, but GYN recommended she have non-hormonal testing to evaluate her fatigue (pt preferred to get through PCP).  They had also discussed possibility of Effexor. Previously was too sleepy from zoloft. She reports she tried several meds prior to taking Zoloft. "I'm not willing to take anything". She doesn't really understand how pills can help  her.  At her December 2022 physcal she had reported she weaned herself off the zoloft when she had started the estradiol patch, and that regular exercise helped with her moods. Her moods at that time were worse, and stress has only increased since then.   PMH, PSH, SH reviewed  Outpatient Encounter Medications as of 04/15/2022  Medication Sig Note   Ascorbic Acid (VITAMIN C) 1000 MG tablet Take 2,000 mg by mouth daily.    azelastine (ASTELIN) 0.1 % nasal spray Place 1 spray into both nostrils daily at 6 (six) AM.    azelastine (OPTIVAR) 0.05 % ophthalmic solution Apply 1 drop to eye 2 (two) times daily.    b complex vitamins capsule Take 2 capsules by mouth daily.    BIOTIN PO Take 10,000 mg by mouth daily.    cholecalciferol (VITAMIN D) 1000 UNITS tablet Take 3,000 Units by mouth daily.    estradiol (ESTRACE) 0.1 MG/GM vaginal cream Place 1 Applicatorful vaginally once a week.    estradiol (VIVELLE-DOT) 0.075 MG/24HR Place 1 patch onto the skin 2 (two) times a week.    EVENING PRIMROSE OIL PO Take 300 mg by mouth daily at 6 (six) AM.    fexofenadine-pseudoephedrine (ALLEGRA-D 24) 180-240 MG per 24 hr tablet Take 1 tablet by mouth daily.    fluticasone (FLONASE) 50 MCG/ACT nasal spray Place 2 sprays into the nose daily.    ibuprofen (ADVIL) 600 MG tablet Take 1 tablet (600 mg total) by mouth every 6 (six) hours as needed. 04/15/2022: Took this am  lansoprazole (PREVACID) 15 MG capsule Take 15 mg by mouth daily.    lubiprostone (AMITIZA) 8 MCG capsule TAKE 1 CAPSULE TWICE A DAY WITH MEALS    NON FORMULARY 30 mg by Infiltration route as needed. Uses via irrigation prn purulent nasal drainage 04/15/2022: Capsule form now   sennosides-docusate sodium (SENOKOT-S) 8.6-50 MG tablet Take 1 tablet by mouth daily. 06/16/2021: Peri-colace--usually takes 3/night (lately taking 4-5)   TOPAMAX 25 MG tablet Take 25 mg by mouth daily.    TURMERIC CURCUMIN PO Take 3 capsules by mouth daily at 6 (six) AM.     vitamin E 1000 UNIT capsule Take 1,000 Units by mouth daily.    zinc gluconate 50 MG tablet Take 50 mg by mouth daily.    [DISCONTINUED] gentamicin (GARAMYCIN) 40 MG/ML injection Place 40 mg into the nose as needed. Reported on 09/09/2015 06/08/2020: Uses via irrigation prn purulent nasal drainage   dicyclomine (BENTYL) 10 MG capsule Take 1 capsule (10 mg total) by mouth every 8 (eight) hours as needed for spasms. (Patient not taking: Reported on 05/28/2021) 06/16/2021: As needed   ondansetron (ZOFRAN) 4 MG tablet Take 1 tablet (4 mg total) by mouth every 8 (eight) hours as needed for nausea or vomiting. Take as directed on prep instructions (Patient not taking: Reported on 06/11/2021)    valACYclovir (VALTREX) 1000 MG tablet Take 2 tablets at onset of cold sore.  Repeat once in 12 hours (4 pills/course) (Patient not taking: Reported on 04/15/2022) 04/15/2022: Last used last week   [DISCONTINUED] predniSONE (DELTASONE) 20 MG tablet Take 1 tablet (20 mg total) by mouth 2 (two) times daily with a meal.    [DISCONTINUED] scopolamine (TRANSDERM-SCOP) 1 MG/3DAYS Place 1 patch (1.5 mg total) onto the skin every 3 (three) days.    [DISCONTINUED] tetrahydrozoline-zinc (VISINE-AC) 0.05-0.25 % ophthalmic solution Place 2 drops into both eyes 3 (three) times daily as needed.    No facility-administered encounter medications on file as of 04/15/2022.   Allergies  Allergen Reactions   Sulfa Antibiotics Rash   ROS: itching (arms, legs, chest), stress, weight gain. She had some blisters at lips--these have peeled off and improved, just starting to get another bump. No fever, chills, URI symptoms or other complaints except as noted in HPI.   PHYSICAL EXAM:  BP 130/80   Pulse 80   Temp (!) 96.8 F (36 C) (Tympanic)   Ht 5' 5"  (1.651 m)   Wt 150 lb 3.2 oz (68.1 kg)   LMP 01/10/2013   BMI 24.99 kg/m   Wt Readings from Last 3 Encounters:  04/15/22 150 lb 3.2 oz (68.1 kg)  06/16/21 149 lb (67.6 kg)  06/11/21  140 lb (63.5 kg)   Somewhat anxious appearing female, seems frustrated. Frequently rubbing at her arms. HEENT: conjunctiva and sclera are clear, EOMI. Minimal (<14m) start of a papule (not fluid-filled) at left upper lip.  Remainder of lips/skin appears normal. Neck: no lymphadenopathy or mass Heart: regular rate and rhythm Lungs: clear bilaterally Abdomen: soft, nontender, no organomegaly or mass Extremities: no edema   ASSESSMENT/PLAN:  Fatigue, unspecified type - check labs, suspect related to anxiety/stress. To consider meds and f/u to discuss. Cont counseling. Consider NAMI - Plan: CBC with Differential/Platelet, Comprehensive metabolic panel, VITAMIN D 25 Hydroxy (Vit-D Deficiency, Fractures), TSH  Itchy skin - suspect related to stress; check labs. Discussed moisturizers and OTC meds - Plan: CBC with Differential/Platelet, Comprehensive metabolic panel, VITAMIN D 25 Hydroxy (Vit-D Deficiency, Fractures), TSH  Stress due to  illness of family member - she has significant anxiety; strongly encouraged to consider meds and f/u to discuss, vs seeing psych (pt declines)  Discussed how stress can affect [place] (physically, fatigue, itching, weight), and that counseling alone may not help get the physical symptoms in check. She previously thought a lot of her symptoms were related to menopause, but higher doses of estrogen made her worse, rather than better.  Advised that my recommendation is that if her labs are normal, she should return for PHQ-9, GAD-7 and discussion of medications (in addition to counseling, support groups). She hadn't looked into NAMI, looking for folks with Turner syndrome and mental health illness.  Advised that seeking support from others dealing with family members with mental health will help a lot, even if not the exact situation--it is challenging for all parents.  I spent 37 minutes dedicated to the care of this patient, including pre-visit review of records, face to  face time, post-visit ordering of testing and documentation.   Declines flu shot.  Cbc, c-met, TSH, D

## 2022-04-15 ENCOUNTER — Telehealth: Payer: Self-pay | Admitting: *Deleted

## 2022-04-15 ENCOUNTER — Ambulatory Visit (INDEPENDENT_AMBULATORY_CARE_PROVIDER_SITE_OTHER): Admitting: Family Medicine

## 2022-04-15 ENCOUNTER — Encounter: Payer: Self-pay | Admitting: Family Medicine

## 2022-04-15 VITALS — BP 130/80 | HR 80 | Temp 96.8°F | Ht 65.0 in | Wt 150.2 lb

## 2022-04-15 DIAGNOSIS — Z6379 Other stressful life events affecting family and household: Secondary | ICD-10-CM

## 2022-04-15 DIAGNOSIS — L299 Pruritus, unspecified: Secondary | ICD-10-CM

## 2022-04-15 DIAGNOSIS — R5383 Other fatigue: Secondary | ICD-10-CM | POA: Diagnosis not present

## 2022-04-15 NOTE — Telephone Encounter (Signed)
error 

## 2022-04-15 NOTE — Patient Instructions (Signed)
Use creams rather than lotions (cetaphl and cerevie are good ones). You can try Sarna anti-itch lotion. Continue your antihistamines as needed. Try not to scratch!!!  I do think that stress contributes to your fatigue, possibly itchy, and definitely your weight concerns. We discussed counseling, but that medications likely are needed in addition. Consider looking into Effexor, Pristiq, Trintellix, Lexapro.  Look into NAMI for support, as we discussed.  If your labs are all normal, I think you should consider medications (either scheduling a 30 min visit here to discuss, vs with a psychiatrist, as we discussed).

## 2022-04-16 LAB — CBC WITH DIFFERENTIAL/PLATELET
Basophils Absolute: 0 10*3/uL (ref 0.0–0.2)
Basos: 1 %
EOS (ABSOLUTE): 0.1 10*3/uL (ref 0.0–0.4)
Eos: 2 %
Hematocrit: 38.5 % (ref 34.0–46.6)
Hemoglobin: 12.9 g/dL (ref 11.1–15.9)
Immature Grans (Abs): 0 10*3/uL (ref 0.0–0.1)
Immature Granulocytes: 0 %
Lymphocytes Absolute: 2.5 10*3/uL (ref 0.7–3.1)
Lymphs: 45 %
MCH: 31.2 pg (ref 26.6–33.0)
MCHC: 33.5 g/dL (ref 31.5–35.7)
MCV: 93 fL (ref 79–97)
Monocytes Absolute: 0.7 10*3/uL (ref 0.1–0.9)
Monocytes: 12 %
Neutrophils Absolute: 2.2 10*3/uL (ref 1.4–7.0)
Neutrophils: 40 %
Platelets: 339 10*3/uL (ref 150–450)
RBC: 4.13 x10E6/uL (ref 3.77–5.28)
RDW: 12.1 % (ref 11.7–15.4)
WBC: 5.5 10*3/uL (ref 3.4–10.8)

## 2022-04-16 LAB — COMPREHENSIVE METABOLIC PANEL
ALT: 15 IU/L (ref 0–32)
AST: 16 IU/L (ref 0–40)
Albumin/Globulin Ratio: 2.6 — ABNORMAL HIGH (ref 1.2–2.2)
Albumin: 4.6 g/dL (ref 3.8–4.9)
Alkaline Phosphatase: 53 IU/L (ref 44–121)
BUN/Creatinine Ratio: 26 — ABNORMAL HIGH (ref 9–23)
BUN: 20 mg/dL (ref 6–24)
Bilirubin Total: 0.2 mg/dL (ref 0.0–1.2)
CO2: 22 mmol/L (ref 20–29)
Calcium: 9.7 mg/dL (ref 8.7–10.2)
Chloride: 103 mmol/L (ref 96–106)
Creatinine, Ser: 0.77 mg/dL (ref 0.57–1.00)
Globulin, Total: 1.8 g/dL (ref 1.5–4.5)
Glucose: 84 mg/dL (ref 70–99)
Potassium: 4.7 mmol/L (ref 3.5–5.2)
Sodium: 139 mmol/L (ref 134–144)
Total Protein: 6.4 g/dL (ref 6.0–8.5)
eGFR: 93 mL/min/{1.73_m2} (ref >59)

## 2022-04-16 LAB — VITAMIN D 25 HYDROXY (VIT D DEFICIENCY, FRACTURES): Vit D, 25-Hydroxy: 71 ng/mL (ref 30.0–100.0)

## 2022-04-16 LAB — TSH: TSH: 1.27 u[IU]/mL (ref 0.450–4.500)

## 2022-04-19 ENCOUNTER — Encounter: Payer: Self-pay | Admitting: Internal Medicine

## 2022-04-21 ENCOUNTER — Encounter: Payer: Self-pay | Admitting: Family Medicine

## 2022-05-23 ENCOUNTER — Ambulatory Visit (INDEPENDENT_AMBULATORY_CARE_PROVIDER_SITE_OTHER): Admitting: Family Medicine

## 2022-05-23 ENCOUNTER — Telehealth: Payer: Self-pay | Admitting: *Deleted

## 2022-05-23 ENCOUNTER — Encounter: Payer: Self-pay | Admitting: Family Medicine

## 2022-05-23 VITALS — BP 136/74 | HR 72 | Ht 65.0 in | Wt 149.0 lb

## 2022-05-23 DIAGNOSIS — K219 Gastro-esophageal reflux disease without esophagitis: Secondary | ICD-10-CM

## 2022-05-23 DIAGNOSIS — F419 Anxiety disorder, unspecified: Secondary | ICD-10-CM

## 2022-05-23 DIAGNOSIS — T753XXD Motion sickness, subsequent encounter: Secondary | ICD-10-CM | POA: Diagnosis not present

## 2022-05-23 DIAGNOSIS — R1013 Epigastric pain: Secondary | ICD-10-CM | POA: Diagnosis not present

## 2022-05-23 DIAGNOSIS — F32A Depression, unspecified: Secondary | ICD-10-CM

## 2022-05-23 DIAGNOSIS — R110 Nausea: Secondary | ICD-10-CM

## 2022-05-23 MED ORDER — ONDANSETRON 4 MG PO TBDP: 4.0000 mg | ORAL_TABLET | Freq: Three times a day (TID) | ORAL | 0 refills | Status: DC | PRN

## 2022-05-23 MED ORDER — LANSOPRAZOLE 30 MG PO CPDR: 30.0000 mg | DELAYED_RELEASE_CAPSULE | Freq: Every day | ORAL | 0 refills | Status: DC

## 2022-05-23 MED ORDER — SCOPOLAMINE 1 MG/3DAYS TD PT72: 1.0000 | MEDICATED_PATCH | TRANSDERMAL | 0 refills | Status: DC

## 2022-05-23 MED ORDER — DESVENLAFAXINE SUCCINATE ER 50 MG PO TB24: 50.0000 mg | ORAL_TABLET | Freq: Every day | ORAL | 0 refills | Status: DC

## 2022-05-23 MED ORDER — DESVENLAFAXINE SUCCINATE ER 25 MG PO TB24: ORAL_TABLET | ORAL | 0 refills | Status: DC

## 2022-05-23 MED ORDER — ESOMEPRAZOLE MAGNESIUM 40 MG PO CPDR: 40.0000 mg | DELAYED_RELEASE_CAPSULE | Freq: Every day | ORAL | 0 refills | Status: DC

## 2022-05-23 NOTE — Patient Instructions (Signed)
Avoid ibuprofen (motrin/advil, also avoid aleve/naproxen). Stop the over-the-counter prevacid and take the prescription strength once daily instead. If you abdominal pain isn't improving within 2-3 weeks, you may need to see your GI doctor for further evaluation (and possible endoscopy).  I'm sending in prescriptions for the motion sickness patch as well as a refill on the zofran to help with your nausea.  Do not start the Pristiq until your nausea has improved. Start at '25mg'$ .  Increase to the '50mg'$  in 1 week, or you can take longer on the '25mg'$  if you need to for side effects.  Ultimately, the goal would be to get to '50mg'$ . We will discuss this further at your physical, but please contact us sooner if you're having issues with side effects, tolerability, letting us know what dose you're taking, if you need the '25mg'$  refilled (ie that you're not going to increase to the '50mg'$  strength that is waiting at the pharmacy for when you finish the '25mg'$ ).

## 2022-05-23 NOTE — Telephone Encounter (Signed)
Patient advised.

## 2022-05-23 NOTE — Telephone Encounter (Signed)
I didn't hear back from Mickel Baas or anyone about a preferred medication, so I went ahead and sent in for nexium '40mg'$ . Hopefully that will go through.

## 2022-05-23 NOTE — Progress Notes (Signed)
Chief Complaint  Patient presents with   Nausea    Has had some nausea on and off-missed two days of work last week. Got sick in work truck last week. Has sinus infection which makes things worse. Stopped taking IBU which made stomach feel worse. Wonders if she could have peptic ulcer(has had before). Not able to eat much-toast,bread, crackers and soup. Also wanting to follow up on depression-tried [place] thinks it worsened stomach issues.    Asking for patch for motion sickness. She has had a lot of ongoing nausea, which is worse when driving. She travels for her job, and on a 1.5 drive back from [place], got sick. Wants to try wearing patch for work. Doesn't recall sedation when she used it in the past. She can't find her zofran. Seeing ENT soon--has had some ongoing sinus issues.  GERD, h/o epigastric pain:  She takes Prevacid (OTC) every day. Today she reports that over the past couple of months has had nausea on and off. This got much worse last week. She is having recurrent epigastric pain, also having belching. No change in diet. Denies dysphagia. She does report she has been taking more ibuprofen for her sinus headaches. She stopped taking ibuprofen last week, as she noted that it made the nausea and pain worse. The pain eased off some, but it is not better. She is worried about a recurrent peptic ulcer.  She denies any black BM's, no coffee ground emesis. Pain is a little worse on an empty stomach. Toast, crackers eases the discomfort.  Depression/anxiety. She discussed her moods/concerns with her GYN Octaviano Glow in September. She reported feeling more forgetful, tired, poor sleep, and full body itching. She had said higher doses of estrogen previously helped with some of these symptoms, and Dr. Rogue Bussing increased her estrogen dose. She felt like the '1mg'$  dose made her moodiness worse, so went back to the 0.'75mg'$ . They had also discussed possibility of Effexor. Previously  was too sleepy from zoloft. She reports she tried several meds prior to taking Zoloft. At her last visit here, she admitted not being willing to take anything. She didn't really understand how pills can help her, and also is very anxious about side effects. She reports she cries a lot. She is seeing a therapist, who suggested [facility] John's Wort.  Bothered her stomach, so stopped it.  At her visit last month, she was asked to look into Effexor, Pristiq, Trintellix, Lexapro, as possible medications, as well as looking into [facility]AMI for support. She contacted [place] 10/12 wanting Pristiq. She was asked to schedule a visit (since the original discussion of these meds were during a short visit for "itching" of her skin).   PMH, PSH, SH reviewed  Outpatient Encounter Medications as of 05/23/2022  Medication Sig Note   Ascorbic Acid (VITAMIN C) 1000 MG tablet Take 2,000 mg by mouth daily.    azelastine (ASTELIN) 0.1 % nasal spray Place 1 spray into both nostrils daily at 6 (six) AM.    azelastine (OPTIVAR) 0.05 % ophthalmic solution Apply 1 drop to eye 2 (two) times daily.    b complex vitamins capsule Take 2 capsules by mouth daily.    BIOTIN PO Take 10,000 mg by mouth daily.    cholecalciferol (VITAMIN D) 1000 UNITS tablet Take 3,000 Units by mouth daily.    estradiol (ESTRACE) 0.1 MG/GM vaginal cream Place 1 Applicatorful vaginally once a week.    estradiol (VIVELLE-DOT) 0.075 MG/24HR Place 1 patch onto the  skin 2 (two) times a week.    EVENING PRIMROSE OIL PO Take 300 mg by mouth daily at 6 (six) AM.    fexofenadine-pseudoephedrine (ALLEGRA-D 24) 180-240 MG per 24 hr tablet Take 1 tablet by mouth daily.    fluticasone (FLONASE) 50 MCG/ACT nasal spray Place 2 sprays into the nose daily.    lansoprazole (PREVACID) 15 MG capsule Take 15 mg by mouth daily.    lubiprostone (AMITIZA) 8 MCG capsule TAKE 1 CAPSULE TWICE A DAY WITH MEALS    sennosides-docusate sodium (SENOKOT-S) 8.6-50 MG tablet Take 1 tablet by  mouth daily. 06/16/2021: Peri-colace--usually takes 3/night (lately taking 4-5)   TOPAMAX 25 MG tablet Take 25 mg by mouth daily.    TURMERIC CURCUMIN PO Take 3 capsules by mouth daily at 6 (six) AM.    vitamin E 1000 UNIT capsule Take 1,000 Units by mouth daily.    zinc gluconate 50 MG tablet Take 50 mg by mouth daily.    ibuprofen (ADVIL) 600 MG tablet Take 1 tablet (600 mg total) by mouth every 6 (six) hours as needed. (Patient not taking: Reported on 05/23/2022)    NON FORMULARY 30 mg by Infiltration route as needed. Uses via irrigation prn purulent nasal drainage (Patient not taking: Reported on 05/23/2022) 04/15/2022: Capsule form now   ondansetron (ZOFRAN) 4 MG tablet Take 1 tablet (4 mg total) by mouth every 8 (eight) hours as needed for nausea or vomiting. Take as directed on prep instructions (Patient not taking: Reported on 06/11/2021)    valACYclovir (VALTREX) 1000 MG tablet Take 2 tablets at onset of cold sore.  Repeat once in 12 hours (4 pills/course) (Patient not taking: Reported on 04/15/2022) 05/23/2022: prn   [DISCONTINUED] dicyclomine (BENTYL) 10 MG capsule Take 1 capsule (10 mg total) by mouth every 8 (eight) hours as needed for spasms. (Patient not taking: Reported on 05/28/2021) 06/16/2021: As needed   No facility-administered encounter medications on file as of 05/23/2022.   Allergies  Allergen Reactions   Sulfa Antibiotics Rash   ROS: nausea, sinus problems, epigastric pain, depression and anxiety per HPI. +fatigue. She denies any libido or sexual issues prior to start of meds. See HPI.   PHYSICAL EXAM:  BP 136/74   Pulse 72   Ht '5\' 5"'$  (1.651 m)   Wt 149 lb (67.6 kg)   LMP 01/10/2013   BMI 24.79 kg/m   Wt Readings from Last 3 Encounters:  05/23/22 149 lb (67.6 kg)  04/15/22 150 lb 3.2 oz (68.1 kg)  06/16/21 149 lb (67.6 kg)   Well-appearing, pleasant female, in no distress HEENT: conjunctiva and sclera are clear, EOMI Neck: no lymphadenopathy or mass Heart:  regular rate and rhythm Lungs: clear bilaterally Back: no CVA tenderness Abdomen: soft, +epigastric tenderness, no rebound or guarding. No organomegaly or mass, negative Murphy Psych: anxious. Normal eye contact, speech, affect, hygiene and grooming.     05/23/2022   11:44 AM  GAD 7 : Generalized Anxiety Score  Nervous, Anxious, on Edge 1  Control/stop worrying 1  Worry too much - different things 3  Trouble relaxing 3  Restless 0  Easily annoyed or irritable 3  Afraid - awful might happen 3  Total GAD 7 Score 14  Anxiety Difficulty Somewhat difficult       05/23/2022   11:46 AM 06/16/2021   10:03 AM 06/08/2020    9:41 AM 05/15/2019   10:16 AM 09/20/2017    9:27 AM  Depression screen PHQ 2/9  Decreased Interest  3 1 0 0 2  Down, Depressed, Hopeless 3 1 0 0 1  PHQ - 2 Score 6 2 0 0 3  Altered sleeping 3 1   0  Tired, decreased energy '3 3   3  '$ Change in appetite '3 1   1  '$ Feeling bad or failure about yourself  '1 3   1  '$ Trouble concentrating 3 0   0  Moving slowly or fidgety/restless 0 0   0  Suicidal thoughts 0 0   0  PHQ-9 Score '19 10   8  '$ Difficult doing work/chores Very difficult Somewhat difficult   Not difficult at all     ASSESSMENT/PLAN:   Epigastric pain - suspect mild gastritis related to ibuprofen. Stop NSAID. Change to Rx PPI x 1 month, and then can decrease dose back to OTC. To GI if not improving - Plan: DISCONTINUED: lansoprazole (PREVACID) 30 MG capsule  Gastroesophageal reflux disease without esophagitis - cont proper diet/precautions.  Increase PPI to rx strength x 1 month due to new epigastric pain. - Plan: DISCONTINUED: lansoprazole (PREVACID) 30 MG capsule  Nausea - may be related to GERD/gastritis, vs sinus issues, vs stress, and worsened with motion. RF zofran - Plan: ondansetron (ZOFRAN-ODT) 4 MG disintegrating tablet  Motion sickness, subsequent encounter - refill patches--to use prn if ongoing issues with nausea exacerbated with motion. Risks/SE  reviewed - Plan: scopolamine (TRANSDERM SCOP, 1.5 MG,) 1 MG/3DAYS  Anxiety and depression - Risks/SE reviewed in detail; to start at '25mg'$  and increase to '50mg'$  if/when tolerated. All questions answered. f/u 6 weeks - Plan: desvenlafaxine (PRISTIQ) 25 MG 24 hr tablet, desvenlafaxine (PRISTIQ) 50 MG 24 hr tablet  Avoid ibuprofen (motrin/advil, also avoid aleve/naproxen). Stop the over-the-counter prevacid and take the prescription strength once daily instead. If you abdominal pain isn't improving within 2-3 weeks, you may need to see your GI doctor for further evaluation (and possible endoscopy).  I'm sending in prescriptions for the motion sickness patch as well as a refill on the zofran to help with your nausea.  Do not start the Pristiq until your nausea has improved. Start at '25mg'$ .  Increase to the '50mg'$  in 1 week, or you can take longer on the '25mg'$  if you need to for side effects.  Ultimately, the goal would be to get to '50mg'$ . We will discuss this further at your physical, but please contact us sooner if you're having issues with side effects, tolerability, letting us know what dose you're taking, if you need the '25mg'$  refilled (ie that you're not going to increase to the '50mg'$  strength that is waiting at the pharmacy for when you finish the '25mg'$ ).  F/u at [facility] as scheduled next month, sooner prn.

## 2022-05-23 NOTE — Telephone Encounter (Signed)
PA came over for Lansoprazole '30mg'$ , called patient and she is okay changing to another medication. I told her I would call her once change was made.

## 2022-05-24 ENCOUNTER — Encounter: Payer: Self-pay | Admitting: Family Medicine

## 2022-05-24 ENCOUNTER — Telehealth: Payer: Self-pay | Admitting: Family Medicine

## 2022-05-24 NOTE — Telephone Encounter (Signed)
Spoke with patient and she will take two '15mg'$  OTC prevacid in the meantime.

## 2022-05-24 NOTE — Telephone Encounter (Signed)
P.A. LANSOPRAZOLE completed,  preferred meds are Omeprazole & Pantoprazole.  Called pt and she has not tried Omeprazole.

## 2022-05-25 MED ORDER — OMEPRAZOLE 40 MG PO CPDR: 40.0000 mg | DELAYED_RELEASE_CAPSULE | Freq: Every day | ORAL | 0 refills | Status: DC

## 2022-05-25 NOTE — Telephone Encounter (Signed)
Still haven't heard back from P.A. I will be out of the office til Monday, the preferred meds are Omeprazole & Pantoprazole. Do you want to switch to one of these as she will probably be required to have trial and failure of both ?

## 2022-05-25 NOTE — Telephone Encounter (Signed)
She never needed a prior auth.  I just needed to know which meds were preferred so that I could send it in.  I already guessed wrong twice. I have now sent in for omeprazole.

## 2022-05-27 NOTE — Telephone Encounter (Signed)
P.A. denied, pt has been switched

## 2022-05-30 ENCOUNTER — Encounter: Payer: Self-pay | Admitting: Family Medicine

## 2022-05-30 DIAGNOSIS — F419 Anxiety disorder, unspecified: Secondary | ICD-10-CM

## 2022-06-01 MED ORDER — DULOXETINE HCL 20 MG PO CPEP: 20.0000 mg | ORAL_CAPSULE | Freq: Every day | ORAL | 1 refills | Status: DC

## 2022-06-14 ENCOUNTER — Encounter: Payer: Self-pay | Admitting: Family Medicine

## 2022-06-15 ENCOUNTER — Ambulatory Visit (INDEPENDENT_AMBULATORY_CARE_PROVIDER_SITE_OTHER): Admitting: Family Medicine

## 2022-06-15 ENCOUNTER — Encounter: Payer: Self-pay | Admitting: Family Medicine

## 2022-06-15 VITALS — BP 130/80 | HR 72 | Temp 96.7°F | Ht 65.0 in | Wt 149.0 lb

## 2022-06-15 DIAGNOSIS — T753XXD Motion sickness, subsequent encounter: Secondary | ICD-10-CM

## 2022-06-15 DIAGNOSIS — N3000 Acute cystitis without hematuria: Secondary | ICD-10-CM

## 2022-06-15 DIAGNOSIS — R300 Dysuria: Secondary | ICD-10-CM | POA: Diagnosis not present

## 2022-06-15 DIAGNOSIS — R350 Frequency of micturition: Secondary | ICD-10-CM

## 2022-06-15 LAB — POCT URINALYSIS DIP (PROADVANTAGE DEVICE)
Bilirubin, UA: NEGATIVE
Blood, UA: NEGATIVE
Glucose, UA: NEGATIVE mg/dL
Ketones, POC UA: NEGATIVE mg/dL
Nitrite, UA: NEGATIVE
Protein Ur, POC: NEGATIVE mg/dL
Specific Gravity, Urine: 1.025
Urobilinogen, Ur: 0.2
pH, UA: 6 (ref 5.0–8.0)

## 2022-06-15 MED ORDER — SCOPOLAMINE 1 MG/3DAYS TD PT72: 1.0000 | MEDICATED_PATCH | TRANSDERMAL | 0 refills | Status: DC

## 2022-06-15 MED ORDER — NITROFURANTOIN MONOHYD MACRO 100 MG PO CAPS: 100.0000 mg | ORAL_CAPSULE | Freq: Two times a day (BID) | ORAL | 0 refills | Status: DC

## 2022-06-15 NOTE — Progress Notes (Signed)
Chief Complaint  Patient presents with   Urinary Frequency    Having burning and pain as well frequency with urination x 2 days. Says her urine is cloudy, also had an accident. DId not take any Uristat.    Medication Refill    Needs refill on transcopolomine patches.    3-4 days ago she noticed her urine was cloudy. The next day she had incontinence. Has issues with urge incontinence (borderline) since menopausal issues, but has been worse the last few days.  She is having urgency, frequency, dysuria. "All my nerves are activated", reports pain is everywhere.  Some chills, no fever. Has some chronic abdominal pain from IBS, has noted some intermittent lower abdominal pain, worse today (since stopped Urestat--she took [employer] yesterday, which helped some.)  She has some back pain, but moved a heavy box yesterday.  No prior h/o bladder infections. She just finished amoxil for a sinus infection. This gave her bad diarrhea. Wondering if that's related to her UTI.  Also took diflucan.  Currently denies vaginal discharge or itching.   Motion sickness--This has been a problem related to work. She travels to [place] 2x/month for work, 1.5 hour drive from [place].  Trouble with nausea, dizziness on the way back.  The last few times she tried the patches and they have helped. Meclizine made her too sleepy. She is requesting refill on scopalamine patches. Has used zofran as a back-up, if needed for nausea.   PMH, PSH, SH reviewed  Outpatient Encounter Medications as of 06/15/2022  Medication Sig Note   acetaminophen (TYLENOL) 500 MG tablet Take 2,000 mg by mouth every 6 (six) hours as needed. 06/15/2022: Took at 6:30am   Ascorbic Acid (VITAMIN C) 1000 MG tablet Take 2,000 mg by mouth daily.    azelastine (ASTELIN) 0.1 % nasal spray Place 1 spray into both nostrils daily at 6 (six) AM.    azelastine (OPTIVAR) 0.05 % ophthalmic solution Apply 1 drop to eye 2 (two) times daily.    b complex  vitamins capsule Take 2 capsules by mouth daily.    BIOTIN PO Take 10,000 mg by mouth daily.    cholecalciferol (VITAMIN D) 1000 UNITS tablet Take 3,000 Units by mouth daily.    estradiol (ESTRACE) 0.1 MG/GM vaginal cream Place 1 Applicatorful vaginally once a week.    estradiol (VIVELLE-DOT) 0.075 MG/24HR Place 1 patch onto the skin 2 (two) times a week.    EVENING PRIMROSE OIL PO Take 300 mg by mouth daily at 6 (six) AM.    fexofenadine-pseudoephedrine (ALLEGRA-D 24) 180-240 MG per 24 hr tablet Take 1 tablet by mouth daily.    fluticasone (FLONASE) 50 MCG/ACT nasal spray Place 2 sprays into the nose daily.    lansoprazole (PREVACID) 15 MG capsule Take 15 mg by mouth daily.    lubiprostone (AMITIZA) 8 MCG capsule TAKE 1 CAPSULE TWICE A DAY WITH MEALS    sennosides-docusate sodium (SENOKOT-S) 8.6-50 MG tablet Take 1 tablet by mouth daily. 06/16/2021: Peri-colace--usually takes 3/night (lately taking 4-5)   TOPAMAX 25 MG tablet Take 50 mg by mouth daily.    TURMERIC CURCUMIN PO Take 3 capsules by mouth daily at 6 (six) AM.    vitamin E 1000 UNIT capsule Take 1,000 Units by mouth daily.    zinc gluconate 50 MG tablet Take 50 mg by mouth daily.    DULoxetine (CYMBALTA) 20 MG capsule Take 1 capsule (20 mg total) by mouth daily. (Patient not taking: Reported on 06/15/2022) 06/15/2022: Did not  start yet   ibuprofen (ADVIL) 600 MG tablet Take 1 tablet (600 mg total) by mouth every 6 (six) hours as needed. (Patient not taking: Reported on 05/23/2022) 06/15/2022: prn   NON FORMULARY 30 mg by Infiltration route as needed. Uses via irrigation prn purulent nasal drainage (Patient not taking: Reported on 06/15/2022) 06/15/2022: Thinks she is allergic to   omeprazole (PRILOSEC) 40 MG capsule Take 1 capsule (40 mg total) by mouth daily. (Patient not taking: Reported on 06/15/2022)    ondansetron (ZOFRAN-ODT) 4 MG disintegrating tablet Take 1 tablet (4 mg total) by mouth every 8 (eight) hours as needed for nausea or  vomiting. (Patient not taking: Reported on 06/15/2022)    scopolamine (TRANSDERM SCOP, 1.5 MG,) 1 MG/3DAYS Place 1 patch (1.5 mg total) onto the skin every 3 (three) days. (Patient not taking: Reported on 06/15/2022)    valACYclovir (VALTREX) 1000 MG tablet Take 2 tablets at onset of cold sore.  Repeat once in 12 hours (4 pills/course) (Patient not taking: Reported on 04/15/2022) 05/23/2022: prn   No facility-administered encounter medications on file as of 06/15/2022.   Allergies  Allergen Reactions   Sulfa Antibiotics Rash   ROS:  no fever, chills, URI symptoms (improved after recent course of ABX). No vaginal discharge, odor or itching. Took a diflucan after recent antibiotics. No flank pain. Nausea only related to long car rides.  Denies vertigo. No rash or other concerns. See HPI   PHYSICAL EXAM:  BP 130/80   Pulse 72   Temp (!) 96.7 F (35.9 C) (Tympanic)   Ht '5\' 5"'$  (1.651 m)   Wt 149 lb (67.6 kg)   LMP 01/10/2013   BMI 24.79 kg/m   Well-appearing female in no distress HEENT: conjunctiva and sclera are clear, EOMI. Neck: no lymphadenopathy,  or mass Heart: regular rate and rhythm Lungs: clear bilaterally Abdomen: soft, mildly tender in suprapubic area. No organomegaly or mass Back: No flank pain Extremities: no edema  Urine: SG 1.025, 1+ leuks, otherwise normal.   ASSESSMENT/PLAN:  Acute cystitis without hematuria - answered pts many questions re: why she got, discussed proper hygiene, s/sx worsening infection. To limit pyridium, discussed why - Plan: nitrofurantoin, macrocrystal-monohydrate, (MACROBID) 100 MG capsule, Urine Culture  Motion sickness, subsequent encounter - refill patches--to use prn if ongoing issues with nausea exacerbated with motion. Risks/SE reviewed. Meclizine too sedating - Plan: scopolamine (TRANSDERM SCOP, 1.5 MG,) 1 MG/3DAYS  Urinary frequency - Plan: POCT Urinalysis DIP (Proadvantage Device)  Burning with urination - Plan: POCT Urinalysis  DIP (Proadvantage Device)   Please stay well hydrated. Take the antibiotics twice daily for 5 days.  We are sending the urine for culture. Let us know if your symptoms don't completely resolve. Contact us if you develop fever, vomiting, flank pain

## 2022-06-15 NOTE — Patient Instructions (Signed)
  Please stay well hydrated. Take the antibiotics twice daily for 5 days.  We are sending the urine for culture. Let us know if your symptoms don't completely resolve. Contact us if you develop fever, vomiting, flank pain

## 2022-06-19 LAB — URINE CULTURE

## 2022-06-21 ENCOUNTER — Other Ambulatory Visit: Payer: Self-pay | Admitting: Family Medicine

## 2022-06-21 DIAGNOSIS — B001 Herpesviral vesicular dermatitis: Secondary | ICD-10-CM

## 2022-06-21 NOTE — Progress Notes (Unsigned)
No chief complaint on file.   Lauren Blanchard is a 51 y.o. female who presents for a complete physical.  She sees Dr. Rogue Bussing for GYN care.   She was seen last week with a UTI, treated with macrobid. Culture showed E.coli was sensitive to the prescribed ABX.  She reports that her symptoms have improved.  Depression/anxiety: at her GYN visit in September she reported feeling more forgetful, tired, poor sleep, and full body itching. She tried higher estrogen dose (38m), but this made moodiness worse.  She continues to see a therapist.  She didn't tolerate SMadison(bothered her stomach). She wasn't able to tolerate Pristiq--caused insomnia if taken at night, and caused drowsiness and nausea when taken during the day. She felt miserable, and stopped it.  We discussed switching instead to low dose duloxetine (271m. Started??  Skin itching better?  Frontal headaches: She is s/p sinus surgeries in the past. She saw ENT (Dr. MoLaurance Flattenlast month with increased frontal/sinus headaches. Endoscopy was clear, no e/o acute infection. She was treated with steroids, and referred to neuro, thinking headaches a variant of migraine. She subsequently was treated with amoxil a few days later when R sided nasal drainage changed. She is also on topamax for headaches, which have helped some. Currently   GERD, h/o epigastric pain:  In November she had reported worsening nausea, recurrent epigastric pain, also having belching, despite taking daily OTC Prevacid. She had been taking more ibuprofen for headaches.  She stopped ibuprofen, and was prescribed 4069mmeprazole to treat possible gastritis related to NSAIDs. Currently she reports.  She denies any black BM's, no coffee ground emesis, dysphagia. Nausea ??improved?  IBS with constipation--She uses peri-colace nightly. Amitiza??   Vit D deficiency:  Last level was 71 in 04/2022 when taking 3000 IU daily.  Hyperlipidemia:  Borderline high LDL in the  past, with LDL up to 179 in 2019.  In 2020, LDL was significantly improved at 117.  LDL was up to 161 in 2021, 152 last year.  HDL has always been good.   She doesn't eat red meat or eggs, has some cheese (not every day). Uses 1% milk. Has occasional creamy soups (less than previous). Admits "I like butter". She is due for recheck.   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 Uses Valtrex prn.  She takes it as soon as she feels the tingle, and never actually gets a cold sore.  Used it 3-4 times this past year. This was refilled yesterday.   Immunization History  Administered Date(s) Administered   Influenza Split 05/24/2012   Influenza,inj,Quad PF,6+ Mos 05/01/2013, 05/15/2019   MMR 06/07/2010, 07/08/2010   Td 05/24/2010  Tdap 09/03/2014   She declines COVID vaccine, has religious exemption.  Refuses flu shot. Last Pap smear: 10/2016, benign, no high risk HPV present; s/p hysterectomy Last mammogram: 01/2022 Last colonoscopy: 06/2021 5 polyps--tubular adenomas, SSP (and hyperplastic).  Internal/external hemorrhoids and diverticulosis also noted. 3 yr f/u recommended. Last DEXA: never   Dentist: every 6 months   Ophtho:  not recently Exercise:   3x/week--30 mins of cardio, 30-40 minutes of weights.  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   PMH, PSH, SH and FH were reviewed and updated   ROS: The patient denies anorexia, fever, headaches (just occasional  sinus headaches), decreased hearing, breast concerns, chest pain, palpitations, dizziness, syncope, dyspnea on exertion, cough, swelling, vomiting, diarrhea, melena, hematochezia, hematuria, dysuria, vaginal discharge, odor or itch, genital lesions, numbness, tingling, weakness, tremor, suspicious skin lesions, abnormal bleeding/bruising, or enlarged lymph nodes.    Mild urinary urgency, rare incontinence. + constipation, chronic, per HPI Chronic tinnitus (both ears), intermittent No hot flashes, night sweats (on HRT) Some intermittent pain in her knees.  Frontal headaches Nausea Heartburn Itching Depression/anxiety per HPI    PHYSICAL EXAM:  LMP 01/10/2013   Wt Readings from Last 3 Encounters:  06/15/22 149 lb (67.6 kg)  05/23/22 149 lb (67.6 kg)  04/15/22 150 lb 3.2 oz (68.1 kg)    General Appearance:    Alert, cooperative, no distress, appears stated age.   Head:    Normocephalic, without obvious abnormality, atraumatic. Tender at temporalis muscles bilaterally    Eyes:    PERRL, conjunctiva/corneas clear, EOM's intact, fundi benign     Ears:    Normal TM's and EAC's  Nose:    Nasal mucosa with mild edema, pale, clear mucus.   Throat:    Normal, no erythema or lesions  Neck:    Supple, no lymphadenopathy; thyroid: no enlargement/tenderness/nodules; no carotid bruit or JVD. No spinal tenderness.  Back:    Spine nontender, no curvature, ROM normal, no CVA tenderness     Lungs:    Clear to auscultation bilaterally without wheezes, rales or ronchi; respirations unlabored     Chest Wall:    No tenderness or deformity     Heart:    Regular rate and rhythm, S1 and S2 normal, no murmur, rub or gallop     Breast Exam:    Deferred to GYN     Abdomen:    Soft, nondistended, normoactive bowel sounds, no hepatosplenomegaly or mass.  Nontender.  Genitalia:    Deferred to GYN            Extremities:    No clubbing, cyanosis or edema     Pulses:    2+ and symmetric all extremities      Skin:    Skin color, texture, turgor normal, no rashes, though exam is limited, not undressed.  Lymph nodes:    Cervical, supraclavicular nodes normal     Neurologic:    Normal strength, sensation and gait; reflexes 2+ and symmetric throughout                       Psych:   Normal mood, affect, hygiene and grooming.    ***UPDATE--?tender at temporalis m? Frontal? Nose mild edema/pale?     ASSESSMENT/PLAN:  Recheck u/a today ?not taking omeprazole 54m? Did she ever? Is stomach better? No longer taking amitiza?  Offer shingrix (vs returning for NV) Decline COVID  She had c-met, cbc, Vit D and TSH in October. Only lipids needed today.  Reviewed low cholesterol diet in detail.   Discussed monthly self breast exams and yearly mammograms; at least 30 minutes of aerobic activity at least 5 days/week, weight-bearing exercise 2-3x/week; proper sunscreen use reviewed; healthy diet, including goals of calcium and vitamin D intake and alcohol recommendations (less than or equal to 1 drink/day) reviewed; regular seatbelt use; changing batteries in smoke detectors. Immunization recommendations discussed-- Yearly flu shots are recommended, refused. Declines COVID boosters. Shingrix recommended, risks/SE reviewed, to schedule NV if/when desired.  Colonoscopy is UTD, due again 06/2024.  F/u 1 year for CPE To f/u 4-6 weeks after starting 44m duloxetine.

## 2022-06-21 NOTE — Patient Instructions (Incomplete)
  HEALTH MAINTENANCE RECOMMENDATIONS:  It is recommended that you get at least 30 minutes of aerobic exercise at least 5 days/week (for weight loss, you may need as much as 60-90 minutes). This can be any activity that gets your heart rate up. This can be divided in 10-15 minute intervals if needed, but try and build up your endurance at least once a week.  Weight bearing exercise is also recommended twice weekly.  Eat a healthy diet with lots of vegetables, fruits and fiber.  "Colorful" foods have a lot of vitamins (ie green vegetables, tomatoes, red peppers, etc).  Limit sweet tea, regular sodas and alcoholic beverages, all of which has a lot of calories and sugar.  Up to 1 alcoholic drink daily may be beneficial for women (unless trying to lose weight, watch sugars).  Drink a lot of water.  Calcium recommendations are 1200-1500 mg daily (1500 mg for postmenopausal women or women without ovaries), and vitamin D 1000 IU daily.  This should be obtained from diet and/or supplements (vitamins), and calcium should not be taken all at once, but in divided doses.  Monthly self breast exams and yearly mammograms for women over the age of 58 is recommended.  Sunscreen of at least SPF 30 should be used on all sun-exposed parts of the skin when outside between the hours of 10 am and 4 pm (not just when at beach or pool, but even with exercise, golf, tennis, and yard work!)  Use a sunscreen that says "broad spectrum" so it covers both UVA and UVB rays, and make sure to reapply every 1-2 hours.  Remember to change the batteries in your smoke detectors when changing your clock times in the spring and fall. Carbon monoxide detectors are recommended for your home.  Use your seat belt every time you are in a car, and please drive safely and not be distracted with cell phones and texting while driving.  I recommend getting the new shingles vaccine (Shingrix). Schedule a nurse visit at our office when convenient  (based on the possible side effects as discussed).   This is a series of 2 injections, spaced 2 months apart.  It doesn't have to be exactly 2 months apart (but can't be sooner), if that isn't feasible for your schedule, but try and get them close to 2 months (and definitely within 6 months of each other, or else the efficacy of the vaccine drops off). This should be separated from other vaccines by at least 2 weeks.  You can consider re-trying the duloxetine dosing it either in the morning, or when you get home from work. The nausea is usually short-lived (a few days, up to a week). If you need to take some zofran to get through those first few days, that's an option (and ramp up your bowel regimen--miralax daily, fiber).  Be sure that you drink plenty of water--your urine today was very concentrated.  Consider changing to the omeprazole when you restart the duloxetine, in case reflux is contributing to the nausea.  Routine eye exam is recommended.

## 2022-06-21 NOTE — Telephone Encounter (Signed)
Is this okay to refill? 

## 2022-06-22 ENCOUNTER — Encounter: Payer: Self-pay | Admitting: Family Medicine

## 2022-06-22 ENCOUNTER — Ambulatory Visit (INDEPENDENT_AMBULATORY_CARE_PROVIDER_SITE_OTHER): Admitting: Family Medicine

## 2022-06-22 VITALS — BP 134/84 | HR 80 | Ht 64.0 in | Wt 149.0 lb

## 2022-06-22 DIAGNOSIS — R350 Frequency of micturition: Secondary | ICD-10-CM | POA: Diagnosis not present

## 2022-06-22 DIAGNOSIS — K581 Irritable bowel syndrome with constipation: Secondary | ICD-10-CM

## 2022-06-22 DIAGNOSIS — Z0000 Encounter for general adult medical examination without abnormal findings: Secondary | ICD-10-CM | POA: Diagnosis not present

## 2022-06-22 DIAGNOSIS — R3915 Urgency of urination: Secondary | ICD-10-CM

## 2022-06-22 DIAGNOSIS — G43909 Migraine, unspecified, not intractable, without status migrainosus: Secondary | ICD-10-CM

## 2022-06-22 DIAGNOSIS — E7800 Pure hypercholesterolemia, unspecified: Secondary | ICD-10-CM

## 2022-06-22 LAB — POCT URINALYSIS DIP (PROADVANTAGE DEVICE)
Bilirubin, UA: NEGATIVE
Blood, UA: NEGATIVE
Glucose, UA: NEGATIVE mg/dL
Ketones, POC UA: NEGATIVE mg/dL
Leukocytes, UA: NEGATIVE
Nitrite, UA: NEGATIVE
Protein Ur, POC: NEGATIVE mg/dL
Specific Gravity, Urine: 1.02
Urobilinogen, Ur: 0.2
pH, UA: 6 (ref 5.0–8.0)

## 2022-06-22 MED ORDER — LUBIPROSTONE 8 MCG PO CAPS: ORAL_CAPSULE | ORAL | 3 refills | Status: DC

## 2022-06-23 ENCOUNTER — Other Ambulatory Visit: Payer: Self-pay | Admitting: Family Medicine

## 2022-06-23 LAB — LIPID PANEL
Chol/HDL Ratio: 3.1 ratio (ref 0.0–4.4)
Cholesterol, Total: 205 mg/dL — ABNORMAL HIGH (ref 100–199)
HDL: 66 mg/dL (ref >39)
LDL Chol Calc (NIH): 117 mg/dL — ABNORMAL HIGH (ref 0–99)
Triglycerides: 126 mg/dL (ref 0–149)
VLDL Cholesterol Cal: 22 mg/dL (ref 5–40)

## 2022-06-26 ENCOUNTER — Encounter: Payer: Self-pay | Admitting: Family Medicine

## 2022-06-27 ENCOUNTER — Encounter: Payer: Self-pay | Admitting: Internal Medicine

## 2022-10-17 ENCOUNTER — Encounter: Payer: Self-pay | Admitting: Family Medicine

## 2022-10-21 ENCOUNTER — Encounter: Payer: Self-pay | Admitting: Family Medicine

## 2022-11-01 ENCOUNTER — Telehealth: Payer: Self-pay | Admitting: Family Medicine

## 2022-11-01 NOTE — Telephone Encounter (Unsigned)
P.A. AMITIZA 

## 2022-11-01 NOTE — Telephone Encounter (Signed)
P.A. was done, waiting response

## 2022-11-02 NOTE — Telephone Encounter (Signed)
P.A. approved til 07/10/98, sent mychart message  

## 2022-11-25 ENCOUNTER — Telehealth: Payer: Self-pay | Admitting: Family Medicine

## 2022-11-25 DIAGNOSIS — K581 Irritable bowel syndrome with constipation: Secondary | ICD-10-CM

## 2022-11-25 MED ORDER — LUBIPROSTONE 8 MCG PO CAPS: ORAL_CAPSULE | ORAL | 0 refills | Status: DC

## 2022-11-25 NOTE — Telephone Encounter (Signed)
Pt called and states that her medication Amitiza has been delayed from the mail order pharmacy. She has not taken it in two days and will not be in for another several days. She is requested a weeks worth be sent into a local pharmacy. Please send to [facility] in [place]. Sending to [facility] as Dr. Lynelle Doctor not in.

## 2022-11-25 NOTE — Telephone Encounter (Signed)
Prescription sent

## 2023-01-23 ENCOUNTER — Ambulatory Visit: Admitting: Family Medicine

## 2023-01-23 ENCOUNTER — Encounter: Payer: Self-pay | Admitting: Family Medicine

## 2023-01-23 VITALS — BP 124/80 | HR 72 | Temp 97.9°F | Ht 64.0 in | Wt 154.0 lb

## 2023-01-23 DIAGNOSIS — G4700 Insomnia, unspecified: Secondary | ICD-10-CM | POA: Diagnosis not present

## 2023-01-23 DIAGNOSIS — R350 Frequency of micturition: Secondary | ICD-10-CM

## 2023-01-23 DIAGNOSIS — F419 Anxiety disorder, unspecified: Secondary | ICD-10-CM

## 2023-01-23 DIAGNOSIS — Z6379 Other stressful life events affecting family and household: Secondary | ICD-10-CM | POA: Diagnosis not present

## 2023-01-23 LAB — POCT URINALYSIS DIP (PROADVANTAGE DEVICE)
Bilirubin, UA: NEGATIVE
Blood, UA: NEGATIVE
Glucose, UA: NEGATIVE mg/dL
Ketones, POC UA: NEGATIVE mg/dL
Leukocytes, UA: NEGATIVE
Nitrite, UA: NEGATIVE
Protein Ur, POC: NEGATIVE mg/dL
Specific Gravity, Urine: 1.015
Urobilinogen, Ur: 0.2
pH, UA: 6 (ref 5.0–8.0)

## 2023-01-23 MED ORDER — ALPRAZOLAM 0.5 MG PO TABS
0.2500 mg | ORAL_TABLET | Freq: Three times a day (TID) | ORAL | 0 refills | Status: DC | PRN
Start: 2023-01-23 — End: 2023-05-22

## 2023-01-23 NOTE — Progress Notes (Unsigned)
Chief Complaint  Patient presents with   Urinary Frequency    Urinary frequency and when she goes she feel like she is not going a lot. Feels run down. Has no been sleeping well. Has been going on for a couple weeks.    Other    Asking if you would rx her alprazolam to have on hand for sleep. (I told her she may need to schedule sleep visit)   2 weeks of urinary symptoms--frequency and urgency, sometimes just small amounts. No odor or cloudiness to the urine. Some intermittent slight discomfort with urination.  Not "bad" like UTI's in the past.  Feels a little run down. No f/c, vomiting, abdominal or flank pain Occ nausea  She quit her job in order to help her daughter. She quit in April, but helps out once a week in the office. She is taking her 2x/week to [facility] for ECT, weekly to psychiatrist She isn't sleeping well. Sometimes has a hard time getting to sleep, wakes up every 2-3 hours, sometimes can get back tosleep, sometimes she can't. Some nights she can't sleep at all. She has tried lots of things--praying, etc, doesn't work. Sometimes focusing on one thing can help. Sleeps better the nights she works out. Seeing therapist q3 weeks.  Last prescription for alprazolam --0.25 mg #4 pills 06/07/21--used the last 2 before a dentist appointment recently, and slept very well. She is asking for some alprazolam to help with slep.    PMH, PSH, SH reviewed/updated  Outpatient Encounter Medications as of 01/23/2023  Medication Sig Note   acetaminophen (TYLENOL) 325 MG tablet Take 650 mg by mouth every 6 (six) hours as needed. 01/23/2023: Took 2 this am   ALPRAZolam (XANAX) 0.5 MG tablet Take 0.5-1 tablets (0.25-0.5 mg total) by mouth 3 (three) times daily as needed for anxiety or sleep.    Ascorbic Acid (VITAMIN C) 1000 MG tablet Take 2,000 mg by mouth daily.    azelastine (ASTELIN) 0.1 % nasal spray Place 1 spray into both nostrils daily at 6 (six) AM.    azelastine (OPTIVAR) 0.05 %  ophthalmic solution Apply 1 drop to eye 2 (two) times daily.    b complex vitamins capsule Take 2 capsules by mouth daily.    BIOTIN PO Take 30,000 mg by mouth daily.    cholecalciferol (VITAMIN D) 1000 UNITS tablet Take 3,000 Units by mouth daily.    estradiol (ESTRACE) 0.1 MG/GM vaginal cream Place 1 Applicatorful vaginally once a week.    estradiol (VIVELLE-DOT) 0.075 MG/24HR Place 1 patch onto the skin 2 (two) times a week.    EVENING PRIMROSE OIL PO Take 300 mg by mouth daily at 6 (six) AM.    fexofenadine-pseudoephedrine (ALLEGRA-D 24) 180-240 MG per 24 hr tablet Take 1 tablet by mouth daily.    fluticasone (FLONASE) 50 MCG/ACT nasal spray Place 2 sprays into the nose daily.    lansoprazole (PREVACID) 15 MG capsule Take 15 mg by mouth daily.    lubiprostone (AMITIZA) 8 MCG capsule TAKE 1 CAPSULE TWICE A DAY WITH MEALS    NON FORMULARY 30 mg by Infiltration route as needed. Uses via irrigation prn purulent nasal drainage 01/23/2023: saline   NONFORMULARY OR COMPOUNDED ITEM Take 6 capsules by mouth daily. 01/23/2023: Balance of Nature Fruits and Veggie Vitamins (3 fruits and 3 Veggies per daily)   sennosides-docusate sodium (SENOKOT-S) 8.6-50 MG tablet Take 1 tablet by mouth daily. 01/23/2023: Takes 3 each night, some nights 4   TOPAMAX 25 MG  tablet Take 50 mg by mouth daily.    TURMERIC CURCUMIN PO Take 3 capsules by mouth daily at 6 (six) AM.    vitamin E 1000 UNIT capsule Take 1,000 Units by mouth daily.    zinc gluconate 50 MG tablet Take 50 mg by mouth daily.    [DISCONTINUED] acetaminophen (TYLENOL) 500 MG tablet Take 2,000 mg by mouth every 6 (six) hours as needed.    ibuprofen (ADVIL) 600 MG tablet Take 1 tablet (600 mg total) by mouth every 6 (six) hours as needed. (Patient not taking: Reported on 05/23/2022) 01/23/2023: As needed   omeprazole (PRILOSEC) 40 MG capsule Take 1 capsule (40 mg total) by mouth daily. (Patient not taking: Reported on 06/15/2022)    ondansetron (ZOFRAN-ODT) 4  MG disintegrating tablet Take 1 tablet (4 mg total) by mouth every 8 (eight) hours as needed for nausea or vomiting. (Patient not taking: Reported on 01/23/2023) 01/23/2023: As needed   scopolamine (TRANSDERM SCOP, 1.5 MG,) 1 MG/3DAYS Place 1 patch (1.5 mg total) onto the skin every 3 (three) days. (Patient not taking: Reported on 06/22/2022)    valACYclovir (VALTREX) 1000 MG tablet TAKE 2 TABLETS AT ONSET OF COLD SORE, REPEAT ONCE IN 12 HOURS (4 TABLETS A COURSE) (Patient not taking: Reported on 06/22/2022) 01/23/2023: As needed   [DISCONTINUED] DULoxetine (CYMBALTA) 20 MG capsule Take 1 capsule (20 mg total) by mouth daily. (Patient not taking: Reported on 06/15/2022) 06/22/2022: stopped   No facility-administered encounter medications on file as of 01/23/2023.   Allergies  Allergen Reactions   Nitrofurantoin     rash   Sulfa Antibiotics Rash   ROS:  no fever, chills, URI symptoms, headache, dizziness, chest pain.  Urinary symptoms per HPI. No change to bowels. Stress/anxiety and insomnia per HPI.  No vaginal discharge, odor, itch or bleeding. See HPI   PHYSICAL EXAM:  BP 124/80   Pulse 72   Temp 97.9 F (36.6 C) (Tympanic)   Ht 5\' 4"  (1.626 m)   Wt 154 lb (69.9 kg)   LMP 01/10/2013   BMI 26.43 kg/m   Wt Readings from Last 3 Encounters:  01/23/23 154 lb (69.9 kg)  06/22/22 149 lb (67.6 kg)  06/15/22 149 lb (67.6 kg)   Pleasant, well-appearing female in no distress HEENT: conjunctiva and sclera are clear, EOMI Neck: no lymphadenopathy Heart: regular rate and rhythm Lungs: clear Abdomen: nontender, no suprapubic tenderness Back: no spinal or CVA tenderness Extremities: no edema Psych: mildly anxious, full range of affect. Normal eye contact, speech, hygiene and grooming Neuro: alert and oriented, cranial nerves grossly intact, normal gait  Urine--normal, SG 1.015   ASSESSMENT/PLAN:  Urinary frequency - urgency and frequency, normal u/a.  Will send for culture. Ddx  discussed.  If sx worsen, can contact us for presumptive tx rather than waiting for cx - Plan: POCT Urinalysis DIP (Proadvantage Device), Urine Culture  Anxiety - cont counseling. Discussed risks/SE of alprazolam, to use sparingly - Plan: ALPRAZolam (XANAX) 0.5 MG tablet  Insomnia, unspecified type - related to anxiety. Encouraged more regular exercise (sleeps better). Risks of xanax reviewed. Disc relaxation techniques, OTC's - Plan: ALPRAZolam (XANAX) 0.5 MG tablet  Stress due to illness of family member   We are sending the urine for culture and will contact you if you need an antibiotic. If you feel worse prior to getting your results, please let us know--fever, blood in the urine, abdominal or flank pain, vomiting.  Stay well hydrated. LImit caffeine and soda.  Continue  to work on sleep hygiene techniques. Since exercise seems to help, try and get some daily (just not within 2-3 hours of sleep). Consider things like melatonin, or CBD oil. I'm prescribing some alprazolam to have on hand.  Use these very sparingly, when you can't shut your brian down.  Don't mix with alcohol or other sedating medications. Do not use alprazolam regularly for sleep, as this can ultimately make things worse.

## 2023-01-23 NOTE — Patient Instructions (Signed)
  We are sending the urine for culture and will contact you if you need an antibiotic. If you feel worse prior to getting your results, please let us know--fever, blood in the urine, abdominal or flank pain, vomiting.  Stay well hydrated. LImit caffeine and soda.  Continue to work on sleep hygiene techniques. Since exercise seems to help, try and get some daily (just not within 2-3 hours of sleep). Consider things like melatonin, or CBD oil. I'm prescribing some alprazolam to have on hand.  Use these very sparingly, when you can't shut your brian down.  Don't mix with alcohol or other sedating medications. Do not use alprazolam regularly for sleep, as this can ultimately make things worse.

## 2023-01-24 ENCOUNTER — Encounter: Payer: Self-pay | Admitting: Family Medicine

## 2023-01-24 LAB — URINE CULTURE: Organism ID, Bacteria: NO GROWTH

## 2023-02-13 LAB — HM MAMMOGRAPHY

## 2023-02-16 ENCOUNTER — Encounter: Payer: Self-pay | Admitting: *Deleted

## 2023-05-22 ENCOUNTER — Encounter: Payer: Self-pay | Admitting: Family Medicine

## 2023-05-22 DIAGNOSIS — G4700 Insomnia, unspecified: Secondary | ICD-10-CM

## 2023-05-22 DIAGNOSIS — F419 Anxiety disorder, unspecified: Secondary | ICD-10-CM

## 2023-05-22 MED ORDER — ALPRAZOLAM 0.5 MG PO TABS
0.2500 mg | ORAL_TABLET | Freq: Three times a day (TID) | ORAL | 0 refills | Status: DC | PRN
Start: 2023-05-22 — End: 2023-09-01

## 2023-06-27 NOTE — Progress Notes (Unsigned)
Start time: End time:  Virtual Visit via Video Note  I connected with Lauren Blanchard on 06/27/23 by a video enabled telemedicine application and verified that I am speaking with the correct person using two identifiers.  Location: Patient: *** Provider: office   I discussed the limitations of evaluation and management by telemedicine and the availability of in person appointments. The patient expressed understanding and agreed to proceed.  History of Present Illness:  No chief complaint on file.  Patient originally was scheduled for a physical, but when confirming appointment, was noted that she was sick. She rescheduled her physical to 07/10/23.     Observations/Objective:  LMP 01/10/2013    Assessment and Plan:   Follow Up Instructions:    I discussed the assessment and treatment plan with the patient. The patient was provided an opportunity to ask questions and all were answered. The patient agreed with the plan and demonstrated an understanding of the instructions.   The patient was advised to call back or seek an in-person evaluation if the symptoms worsen or if the condition fails to improve as anticipated.  I spent *** minutes dedicated to the care of this patient, including pre-visit review of records, face to face time, post-visit ordering of testing and documentation.    Lavonda Jumbo, MD

## 2023-06-28 ENCOUNTER — Encounter: Payer: Self-pay | Admitting: Family Medicine

## 2023-06-28 ENCOUNTER — Encounter: Admitting: Family Medicine

## 2023-06-28 ENCOUNTER — Telehealth (INDEPENDENT_AMBULATORY_CARE_PROVIDER_SITE_OTHER): Admitting: Family Medicine

## 2023-06-28 VITALS — Temp 97.6°F | Ht 64.0 in | Wt 154.0 lb

## 2023-06-28 DIAGNOSIS — J069 Acute upper respiratory infection, unspecified: Secondary | ICD-10-CM | POA: Diagnosis not present

## 2023-06-28 DIAGNOSIS — R051 Acute cough: Secondary | ICD-10-CM

## 2023-06-28 MED ORDER — BENZONATATE 200 MG PO CAPS: 200.0000 mg | ORAL_CAPSULE | Freq: Three times a day (TID) | ORAL | 0 refills | Status: DC | PRN

## 2023-06-28 NOTE — Patient Instructions (Addendum)
Stay well hydrated. Add in Mucinex 12 hour (plain), and take this twice daily. You can continue the Delsym night-time, and Delsym during the day, if helping. You shouldn't be taking any Dayquil or other sudafed, due to you taking Allegra-D 24 hour daily. Continue the Navage twice daily. Use the benzonatate as needed for cough. Contact us if you have persistent fever, if your mucus or phlegm is consistently discolored, or other concerns. These could indicate the need for an antibiotic.  Right now this sounds like a bad virus, which can take up to 2 weeks to completely resolve, but usually starts improving after 5-7 days

## 2023-07-09 NOTE — Progress Notes (Unsigned)
No chief complaint on file.   Lauren Blanchard is a 52 y.o. female who presents for a complete physical.  She sees Dr. Claiborne Billings for GYN care, last seen in 02/2023.    She was seen 12/18 for virtual visit for URI after 3-4 days of illness. She had negative COVID test, and husband had the same illness. Supportive measures were reviewed, for viral illness, and tessalon was prescribed.  She called her ENT the following day to request RF on mupirocin ointment 2% to put in her lavage, and requested prednisone (requested by denied by me at virtual visit). She was prescribed prednisone 10mg  daily x 7d by ENT.  She was seen in July with urinary symptoms. Culture was negative. She saw GYN a couple of weeks later. She continues on estrace cream and estradiol patches.  Anxiety, insomnia: She quit her job in 10/2022 in order to help her daughter. She still helps out once a week in the office. In July she reported taking her 2x/week to [facility] for ECT, weekly to psychiatrist In July she reported having trouble sleeping (sometimes has a hard time getting to sleep, or wakes up every 2-3 hours, sometimes can get back to sleep, sometimes can't, some nights not sleeping at all). Working out helps. At that time she noted that she sleeps better the nights she works out. She was seeing a therapist q3 weeks. She had asked for alprazolam to using sparingly, when needed. Last refilled in 05/2023--at that time she reported using melatonin most of the time, with good results. Per PDMP, filled #10 on 05/22/23, prior fill was 01/23/23.  She previously wasn't able to tolerate Pristiq--caused insomnia if taken at night, and caused drowsiness and nausea when taken during the day. She felt miserable, and stopped it. 06/2022 we switched to low dose duloxetine (20mg ). Wasn't able to handle the nausea (started on day 3, though she did report feeling better, felt "the fog lifting"), along with working and dealing with her daughter.      Frontal headaches: She is s/p sinus surgeries in the past, and sees ENT (Dr. Christell Constant) regularly.  She was referred to neuro, thinking headaches a variant of migraine. She takes topamax 50mg  daily, and headaches resolved. ***UPDATE  GERD, h/o epigastric pain:  She takes OTC prevacid daily, and is doing well. In the past, when taking a lot of ibuprofen related to headaches, she had worsening symptoms (nausea, epigastric pain, belching), and required prescription course. She denies any black BM's, no coffee ground emesis, dysphagia.  IBS with constipation--She uses peri-colace nightly, and Amitiza. Bowels are controlled, for the most-part.     Vit D deficiency:  Last level was 71 in 04/2022 when taking 3000 IU daily.  Hyperlipidemia:  Cholesterol has been up and down, always with good HDL.  LDL was improved last year. She denies changes to her diet-- She doesn't eat red meat or eggs, has some cheese (not every day). Uses 1% milk. Has occasional creamy soups (less than previous). Eats chicken and [place], no beef, pork or fish. Puts butter on toast, brocolli, cut back on it. Uses Canola oil She is due for recheck.  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 Uses Valtrex prn.  She takes it as soon as she feels the tingle, and never actually gets a cold sore.   *** how often? Need RF?    Immunization History  Administered Date(s) Administered   Influenza Split 05/24/2012   Influenza,inj,Quad PF,6+ Mos 05/01/2013, 05/15/2019   MMR 06/07/2010, 07/08/2010   Td 05/24/2010   Tdap  09/03/2014   She declines COVID vaccine, has religious exemption.  Refuses flu shot. Last Pap smear: 10/2016, benign, no high risk HPV present; s/p hysterectomy Last mammogram: 02/2023 Last colonoscopy: 06/2021 5 polyps--tubular adenomas, SSP (and hyperplastic).  Internal/external hemorrhoids and diverticulosis also noted. 3 yr f/u recommended. Last DEXA: never   Dentist: every 6 months   Ophtho:  not recently *** Exercise:    OLD_-3x/week--30 mins of cardio, 30-40 minutes of weights.  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   PMH, PSH, SH and FH were reviewed and updated     ROS: The patient denies anorexia, fever, headaches, decreased hearing, breast concerns, chest pain, palpitations, dizziness, syncope, dyspnea on exertion, cough, swelling, vomiting, diarrhea, melena, hematochezia, hematuria, dysuria, vaginal discharge, odor or itch, genital lesions, numbness, tingling, weakness, tremor, suspicious skin lesions, abnormal bleeding/bruising, or enlarged lymph nodes.    Urinary symptoms?? Frequency/incont??  + constipation, chronic, per HPI Chronic tinnitus (both ears), intermittent.  Occasional mild vertigo when she wakes up. Rare hot flashes, night sweats (on HRT) Some intermittent pain in her knees. Heartburn resolved, on OTC prevacid Depression/anxiety overall seems a bit better, but comes in waves, constant up and down.    PHYSICAL EXAM:  LMP 01/10/2013   Wt Readings from Last 3 Encounters:  06/28/23 154 lb (69.9 kg)  01/23/23 154 lb (69.9 kg)  06/22/22 149 lb (67.6 kg)    General Appearance:    Alert, cooperative, no distress, appears stated age.   Head:    Normocephalic, without obvious abnormality, atraumatic.   Eyes:    PERRL, conjunctiva/corneas clear, EOM's intact, fundi benign     Ears:    Normal TM's and EAC's  Nose:    No drainage or sinus tenderness  Throat:    Normal, no erythema or lesions  Neck:     Supple, no lymphadenopathy; thyroid: no enlargement/tenderness/nodules; no carotid bruit or JVD. No spinal tenderness.  Back:    Spine nontender, no curvature, ROM normal, no CVA tenderness     Lungs:    Clear to auscultation bilaterally without wheezes, rales or ronchi; respirations unlabored     Chest Wall:    No tenderness or deformity     Heart:    Regular rate and rhythm, S1 and S2 normal, no murmur, rub or gallop     Breast Exam:    Deferred to GYN     Abdomen:    Soft, nondistended, normoactive bowel sounds, no hepatosplenomegaly or mass.  Nontender.  Genitalia:    Deferred to GYN            Extremities:    No clubbing, cyanosis or edema     Pulses:  2+ and symmetric all extremities     Skin:    Skin color, texture, turgor normal, no rashes, though exam is limited, not undressed.  Lymph nodes:    Cervical, supraclavicular nodes normal     Neurologic:    Normal strength, sensation and gait; reflexes 2+ and symmetric throughout                       Psych:   Normal mood, affect, hygiene and grooming.      ASSESSMENT/PLAN:  Offer/decline flu and COVID  Shingrix? Valtrex RF?  Phq-9, gad-7  Consider restart 20mg  duloxetine, if needed, since not working??  Discussed monthly self breast exams and yearly mammograms; at least 30 minutes of aerobic activity at least 5 days/week, weight-bearing exercise 2-3x/week; proper sunscreen use reviewed; healthy diet, including goals of calcium and vitamin D intake and alcohol recommendations (less than or equal to 1 drink/day) reviewed; regular seatbelt use; changing batteries in smoke detectors. Immunization recommendations discussed-- she declines flu and COVID vaccines. Shingrix recommended, risks/SE reviewed, to schedule NV if/when desired.  Colonoscopy is UTD, due again 06/2024.  F/u 1 year for CPE

## 2023-07-09 NOTE — Patient Instructions (Signed)
  HEALTH MAINTENANCE RECOMMENDATIONS:  It is recommended that you get at least 30 minutes of aerobic exercise at least 5 days/week (for weight loss, you may need as much as 60-90 minutes). This can be any activity that gets your heart rate up. This can be divided in 10-15 minute intervals if needed, but try and build up your endurance at least once a week.  Weight bearing exercise is also recommended twice weekly.  Eat a healthy diet with lots of vegetables, fruits and fiber.  "Colorful" foods have a lot of vitamins (ie green vegetables, tomatoes, red peppers, etc).  Limit sweet tea, regular sodas and alcoholic beverages, all of which has a lot of calories and sugar.  Up to 1 alcoholic drink daily may be beneficial for women (unless trying to lose weight, watch sugars).  Drink a lot of water.  Calcium recommendations are 1200-1500 mg daily (1500 mg for postmenopausal women or women without ovaries), and vitamin D 1000 IU daily.  This should be obtained from diet and/or supplements (vitamins), and calcium should not be taken all at once, but in divided doses.  Monthly self breast exams and yearly mammograms for women over the age of 20 is recommended.  Sunscreen of at least SPF 30 should be used on all sun-exposed parts of the skin when outside between the hours of 10 am and 4 pm (not just when at beach or pool, but even with exercise, golf, tennis, and yard work!)  Use a sunscreen that says "broad spectrum" so it covers both UVA and UVB rays, and make sure to reapply every 1-2 hours.  Remember to change the batteries in your smoke detectors when changing your clock times in the spring and fall. Carbon monoxide detectors are recommended for your home.  Use your seat belt every time you are in a car, and please drive safely and not be distracted with cell phones and texting while driving.  I recommend getting the new shingles vaccine (Shingrix).  This is a series of 2 injections, spaced 2 months  apart.  It doesn't have to be exactly 2 months apart (but can't be sooner), if that isn't feasible for your schedule, but try and get them close to 2 months (and definitely within 6 months of each other, or else the efficacy of the vaccine drops off).

## 2023-07-10 ENCOUNTER — Encounter: Payer: Self-pay | Admitting: *Deleted

## 2023-07-10 ENCOUNTER — Ambulatory Visit (INDEPENDENT_AMBULATORY_CARE_PROVIDER_SITE_OTHER): Admitting: Family Medicine

## 2023-07-10 ENCOUNTER — Encounter: Payer: Self-pay | Admitting: Family Medicine

## 2023-07-10 VITALS — BP 122/72 | HR 84 | Ht 64.0 in | Wt 148.0 lb

## 2023-07-10 DIAGNOSIS — G4700 Insomnia, unspecified: Secondary | ICD-10-CM | POA: Diagnosis not present

## 2023-07-10 DIAGNOSIS — E7800 Pure hypercholesterolemia, unspecified: Secondary | ICD-10-CM

## 2023-07-10 DIAGNOSIS — G43909 Migraine, unspecified, not intractable, without status migrainosus: Secondary | ICD-10-CM | POA: Diagnosis not present

## 2023-07-10 DIAGNOSIS — Z6379 Other stressful life events affecting family and household: Secondary | ICD-10-CM

## 2023-07-10 DIAGNOSIS — J069 Acute upper respiratory infection, unspecified: Secondary | ICD-10-CM

## 2023-07-10 DIAGNOSIS — F419 Anxiety disorder, unspecified: Secondary | ICD-10-CM

## 2023-07-10 DIAGNOSIS — K219 Gastro-esophageal reflux disease without esophagitis: Secondary | ICD-10-CM

## 2023-07-10 DIAGNOSIS — Z0000 Encounter for general adult medical examination without abnormal findings: Secondary | ICD-10-CM

## 2023-07-10 DIAGNOSIS — K581 Irritable bowel syndrome with constipation: Secondary | ICD-10-CM

## 2023-07-10 DIAGNOSIS — Z5181 Encounter for therapeutic drug level monitoring: Secondary | ICD-10-CM

## 2023-07-10 DIAGNOSIS — R233 Spontaneous ecchymoses: Secondary | ICD-10-CM

## 2023-07-10 DIAGNOSIS — F32A Depression, unspecified: Secondary | ICD-10-CM

## 2023-07-10 LAB — LIPID PANEL

## 2023-07-11 LAB — CMP14+EGFR
ALT: 30 IU/L (ref 0–32)
AST: 20 IU/L (ref 0–40)
Albumin: 4.4 g/dL (ref 3.8–4.9)
Alkaline Phosphatase: 60 IU/L (ref 44–121)
BUN/Creatinine Ratio: 16 (ref 9–23)
BUN: 12 mg/dL (ref 6–24)
Bilirubin Total: 0.3 mg/dL (ref 0.0–1.2)
CO2: 23 mmol/L (ref 20–29)
Calcium: 9.7 mg/dL (ref 8.7–10.2)
Chloride: 104 mmol/L (ref 96–106)
Creatinine, Ser: 0.76 mg/dL (ref 0.57–1.00)
Globulin, Total: 2.2 g/dL (ref 1.5–4.5)
Glucose: 92 mg/dL (ref 70–99)
Potassium: 4.2 mmol/L (ref 3.5–5.2)
Sodium: 139 mmol/L (ref 134–144)
Total Protein: 6.6 g/dL (ref 6.0–8.5)
eGFR: 93 mL/min/{1.73_m2} (ref >59)

## 2023-07-11 LAB — LIPID PANEL
Cholesterol, Total: 252 mg/dL — ABNORMAL HIGH (ref 100–199)
HDL: 64 mg/dL (ref >39)
LDL CALC COMMENT:: 3.9 ratio (ref 0.0–4.4)
LDL Chol Calc (NIH): 149 mg/dL — ABNORMAL HIGH (ref 0–99)
Triglycerides: 218 mg/dL — ABNORMAL HIGH (ref 0–149)
VLDL Cholesterol Cal: 39 mg/dL (ref 5–40)

## 2023-07-11 LAB — CBC WITH DIFFERENTIAL/PLATELET
Basophils Absolute: 0 10*3/uL (ref 0.0–0.2)
Basos: 0 %
EOS (ABSOLUTE): 0.1 10*3/uL (ref 0.0–0.4)
Eos: 1 %
Hematocrit: 38.7 % (ref 34.0–46.6)
Hemoglobin: 12.8 g/dL (ref 11.1–15.9)
Immature Grans (Abs): 0 10*3/uL (ref 0.0–0.1)
Immature Granulocytes: 0 %
Lymphocytes Absolute: 2.6 10*3/uL (ref 0.7–3.1)
Lymphs: 26 %
MCH: 31.4 pg (ref 26.6–33.0)
MCHC: 33.1 g/dL (ref 31.5–35.7)
MCV: 95 fL (ref 79–97)
Monocytes Absolute: 0.7 10*3/uL (ref 0.1–0.9)
Monocytes: 7 %
Neutrophils Absolute: 6.4 10*3/uL (ref 1.4–7.0)
Neutrophils: 66 %
Platelets: 407 10*3/uL (ref 150–450)
RBC: 4.08 x10E6/uL (ref 3.77–5.28)
RDW: 12.9 % (ref 11.7–15.4)
WBC: 9.8 10*3/uL (ref 3.4–10.8)

## 2023-08-09 ENCOUNTER — Other Ambulatory Visit: Payer: Self-pay | Admitting: Family Medicine

## 2023-08-09 DIAGNOSIS — K581 Irritable bowel syndrome with constipation: Secondary | ICD-10-CM

## 2023-08-09 NOTE — Telephone Encounter (Signed)
Is this okay to refill?

## 2023-08-25 ENCOUNTER — Other Ambulatory Visit: Payer: Self-pay | Admitting: Family Medicine

## 2023-08-25 DIAGNOSIS — B001 Herpesviral vesicular dermatitis: Secondary | ICD-10-CM

## 2023-08-25 NOTE — Telephone Encounter (Signed)
Is this okay to refill?

## 2023-09-01 ENCOUNTER — Other Ambulatory Visit: Payer: Self-pay | Admitting: Family Medicine

## 2023-09-01 ENCOUNTER — Encounter: Payer: Self-pay | Admitting: Family Medicine

## 2023-09-01 DIAGNOSIS — F419 Anxiety disorder, unspecified: Secondary | ICD-10-CM

## 2023-09-01 DIAGNOSIS — G4700 Insomnia, unspecified: Secondary | ICD-10-CM

## 2023-09-01 NOTE — Telephone Encounter (Signed)
Last apt 07/10/23.

## 2024-01-29 ENCOUNTER — Other Ambulatory Visit: Payer: Self-pay | Admitting: Family Medicine

## 2024-01-29 DIAGNOSIS — T753XXD Motion sickness, subsequent encounter: Secondary | ICD-10-CM

## 2024-01-29 MED ORDER — SCOPOLAMINE 1 MG/3DAYS TD PT72: 1.0000 | MEDICATED_PATCH | TRANSDERMAL | 0 refills | Status: DC

## 2024-01-29 NOTE — Telephone Encounter (Signed)
 Copied from CRM [id number]. Topic: Clinical - Medication Refill >> Jan 29, 2024  3:21 PM Donna BRAVO wrote: Patient requesting medication, patient is in [place]  on vacation and will return 02/03/24 and needs  these patches for motion sickness    Medication: scopolamine  (TRANSDERM SCOP , 1.5 MG,) 1 MG/3DAYS  Has the patient contacted their pharmacy? No Patient is on vacation wanting medication sent to different pharmacy  This is the patient's preferred pharmacy:   [facility]  [address] [place], [place] [address] [place] [phone]      Is this the correct pharmacy for this prescription? Yes If no, delete pharmacy and type the correct one.   Has the prescription been filled recently? No  Is the patient out of the medication? Yes  Has the patient been seen for an appointment in the last year OR does the patient have an upcoming appointment? Yes  Can we respond through MyChart? Yes  Agent: Please be advised that Rx refills may take up to 3 business days. We ask that you follow-up with your pharmacy.

## 2024-01-29 NOTE — Telephone Encounter (Signed)
 Is this okay to refill?

## 2024-02-06 ENCOUNTER — Emergency Department (HOSPITAL_COMMUNITY)
Admission: EM | Admit: 2024-02-06 | Discharge: 2024-02-07 | Disposition: A | Discharge status: Home / Self Care | Attending: Emergency Medicine | Admitting: Emergency Medicine

## 2024-02-06 ENCOUNTER — Other Ambulatory Visit: Payer: Self-pay

## 2024-02-06 ENCOUNTER — Emergency Department (HOSPITAL_COMMUNITY)

## 2024-02-06 DIAGNOSIS — R197 Diarrhea, unspecified: Secondary | ICD-10-CM | POA: Insufficient documentation

## 2024-02-06 DIAGNOSIS — R109 Unspecified abdominal pain: Secondary | ICD-10-CM | POA: Diagnosis not present

## 2024-02-06 DIAGNOSIS — R000 Tachycardia, unspecified: Secondary | ICD-10-CM | POA: Diagnosis not present

## 2024-02-06 DIAGNOSIS — R110 Nausea: Secondary | ICD-10-CM | POA: Insufficient documentation

## 2024-02-06 LAB — COMPREHENSIVE METABOLIC PANEL WITH GFR
ALT: 25 U/L (ref 0–44)
AST: 26 U/L (ref 15–41)
Albumin: 4.4 g/dL (ref 3.5–5.0)
Alkaline Phosphatase: 49 U/L (ref 38–126)
Anion gap: 11 (ref 5–15)
BUN: 14 mg/dL (ref 6–20)
CO2: 19 mmol/L — ABNORMAL LOW (ref 22–32)
Calcium: 9.6 mg/dL (ref 8.9–10.3)
Chloride: 107 mmol/L (ref 98–111)
Creatinine, Ser: 0.81 mg/dL (ref 0.44–1.00)
GFR, Estimated: >60 mL/min (ref >60)
Glucose, Bld: 208 mg/dL — ABNORMAL HIGH (ref 70–99)
Potassium: 3.5 mmol/L (ref 3.5–5.1)
Sodium: 137 mmol/L (ref 135–145)
Total Bilirubin: 0.6 mg/dL (ref 0.0–1.2)
Total Protein: 7.2 g/dL (ref 6.5–8.1)

## 2024-02-06 LAB — CBC
HCT: 41.8 % (ref 36.0–46.0)
Hemoglobin: 14.2 g/dL (ref 12.0–15.0)
MCH: 31.8 pg (ref 26.0–34.0)
MCHC: 34 g/dL (ref 30.0–36.0)
MCV: 93.5 fL (ref 80.0–100.0)
Platelets: 376 K/uL (ref 150–400)
RBC: 4.47 MIL/uL (ref 3.87–5.11)
RDW: 12.1 % (ref 11.5–15.5)
WBC: 12 K/uL — ABNORMAL HIGH (ref 4.0–10.5)
nRBC: 0 % (ref 0.0–0.2)

## 2024-02-06 LAB — LIPASE, BLOOD: Lipase: 29 U/L (ref 11–51)

## 2024-02-06 LAB — TROPONIN I (HIGH SENSITIVITY): Troponin I (High Sensitivity): 6 ng/L (ref <18)

## 2024-02-06 MED ORDER — SODIUM CHLORIDE 0.9 % IV BOLUS
1000.0000 mL | Freq: Once | INTRAVENOUS | Status: AC
  Administered 2024-02-06: 1000 mL via INTRAVENOUS

## 2024-02-06 MED ORDER — PROMETHAZINE HCL 25 MG/ML IJ SOLN
12.5000 mg | Freq: Once | INTRAMUSCULAR | Status: AC
  Administered 2024-02-06: 12.5 mg via INTRAMUSCULAR
  Filled 2024-02-06: qty 1

## 2024-02-06 MED ORDER — HYDROMORPHONE HCL 1 MG/ML IJ SOLN
1.0000 mg | Freq: Once | INTRAMUSCULAR | Status: AC
  Administered 2024-02-06: 1 mg via INTRAVENOUS
  Filled 2024-02-06: qty 1

## 2024-02-06 MED ORDER — MECLIZINE HCL 25 MG PO TABS
25.0000 mg | ORAL_TABLET | Freq: Once | ORAL | Status: AC
  Administered 2024-02-07: 25 mg via ORAL
  Filled 2024-02-06: qty 1

## 2024-02-06 MED ORDER — ONDANSETRON HCL 4 MG/2ML IJ SOLN
4.0000 mg | Freq: Once | INTRAMUSCULAR | Status: AC
  Administered 2024-02-07: 4 mg via INTRAVENOUS
  Filled 2024-02-06: qty 2

## 2024-02-06 NOTE — ED Triage Notes (Signed)
 Pt started having diarrhea on Sunday, improved yesterday. Starting today patient started having nausea and severe abdominal cramping. Pt unable to verbalize symptoms d/t level of distress. Pt reports hot sweats. Pt reports symptoms came on gradually this morning but have gotten increasingly worse.

## 2024-02-06 NOTE — ED Provider Triage Note (Signed)
 Emergency Medicine Provider Triage Evaluation Note  Lauren Blanchard , a 53 y.o. female  was evaluated in triage.  Pt states she had diarrhea Sunday-Monday and then turned to nausea which started yesterday. Patient states that her whole body hurts including her heart. Had an episode earlier today where her heart rate and whole midsection cramped up. States that this episode really scared her. States her entire abdomen is very painful. Hx of 2 c-sections and adominoplasty. Taking zofran  at home last took 8mg  at 2:30 pm. No vomiting.   Review of Systems  Positive: Nausea, abdominal pain, chest pain, diarrhea, hot sweats Negative: Known fever, shortness of breath, vomiting  Physical Exam  BP 129/87   Pulse (!) 113   Temp 97.7 F (36.5 C) (Axillary)   Resp 20   LMP 01/10/2013   SpO2 100%  Gen:   Awake, clear distress Resp:  Normal effort  MSK:    Other:  Patient lying in bed, clearly in discomfort, wet towel draped over eyes, kicking and cannot get comfortable, generalized abdominal tenderness with palpation, no obvious abnormality with auscultation of heart or lungs, however difficult to get a good exam, patient retching due to nausea  Medical Decision Making  Medically screening exam initiated at 9:35 PM.  Appropriate orders placed.  Lauren Blanchard was informed that the remainder of the evaluation will be completed by another provider, this initial triage assessment does not replace that evaluation, and the importance of remaining in the ED until their evaluation is complete.  Orders: CBC, CMP, lipase, i-STAT lactic acid, UA, troponin x 2, EKG, CT abdomen pelvis with contrast, chest x-ray, Dilaudid  and Phenergan  ordered in triage for symptomatic relief   Janetta Terrall FALCON, PA-C 02/06/24 2152

## 2024-02-06 NOTE — ED Provider Notes (Signed)
 Lauren Blanchard Provider Note   CSN: [id number] Arrival date & time: 02/06/24  2026     Patient presents with: Abdominal Pain   Lauren Blanchard is a 53 y.o. female.  {Add pertinent medical, surgical, social history, OB history to HPI:32947} HPI     This is a 53 year old female who presents with nausea and diarrhea.  Husband provides most of the history as patient does not feel well.  States that they traveled to [place]  this past week.  Patient got motion sickness on the way down to [place]  and was prescribed scopolamine .  She returned home and this past weekend developed some nonbloody diarrhea.  Since that time she has had increasing episodes of nausea and dizziness.  She reports diaphoresis with standing and palpitations.  Diarrhea has actually improved and her stools have become more soft.  No blood in her stools.  Does have a history of vertigo but this feels different.  Reports some crampy abdominal discomfort that started today. Sherral is sick.  She has not had any fevers.  Prior to Admission medications   Medication Sig Start Date End Date Taking? Authorizing Provider  acetaminophen  (TYLENOL ) 325 MG tablet Take 650 mg by mouth every 6 (six) hours as needed.    [provider]  ALPRAZolam  (XANAX ) 0.5 MG tablet TAKE 1/2-1 TABLET (0.25-0.5MG ) BY MOUTH 3 (THREE) TIMES DAILY AS NEEDED FOR ANXIETY OR SLEEP. 09/01/23   Randol Dawes, MD  Ascorbic Acid (VITAMIN C) 1000 MG tablet Take 2,000 mg by mouth daily.    [provider]  azelastine (ASTELIN) 0.1 % nasal spray Place 1 spray into both nostrils daily at 6 (six) AM. 05/11/21   [provider]  azelastine (OPTIVAR) 0.05 % ophthalmic solution Apply 1 drop to eye 2 (two) times daily. 05/02/21   [provider]  b complex vitamins capsule Take 2 capsules by mouth daily.    [provider]  benzonatate  (TESSALON ) 200 MG capsule Take 1 capsule (200 mg total) by mouth  3 (three) times daily as needed for cough. Patient not taking: Reported on 07/10/2023 06/28/23   Randol Dawes, MD  BIOTIN PO Take 30,000 mg by mouth daily.    [provider]  cholecalciferol (VITAMIN D ) 1000 UNITS tablet Take 3,000 Units by mouth daily.    [provider]  Dextromethorphan HBr (DELSYM PO) Take 15 mLs by mouth as needed. Patient not taking: Reported on 07/10/2023    [provider]  DM-Acetaminophen -Triprolidine (DELSYM NIGHTTIME COUGH MAX STR PO) Take 15 mLs by mouth as needed. Patient not taking: Reported on 07/10/2023    [provider]  estradiol (ESTRACE) 0.1 MG/GM vaginal cream Place 1 Applicatorful vaginally once a week. 05/11/21   [provider]  estradiol (VIVELLE-DOT) 0.075 MG/24HR Place 1 patch onto the skin 2 (two) times a week. 01/18/21   [provider]  EVENING PRIMROSE OIL PO Take 300 mg by mouth daily at 6 (six) AM.    [provider]  fexofenadine-pseudoephedrine (ALLEGRA-D 24) 180-240 MG per 24 hr tablet Take 1 tablet by mouth daily.    [provider]  fluticasone (FLONASE) 50 MCG/ACT nasal spray Place 2 sprays into the nose daily.    [provider]  ibuprofen  (ADVIL ) 200 MG tablet Take 200 mg by mouth every 6 (six) hours as needed.    [provider]  lansoprazole  (PREVACID ) 15 MG capsule Take 15 mg by mouth daily.    [provider]  lubiprostone  (AMITIZA ) 8 MCG capsule TAKE 1 CAPSULE TWICE A DAY WITH MEALS 08/09/23   Randol Dawes, MD  Melatonin 3 MG CAPS Take 12 mg by mouth at bedtime.    [provider]  NON FORMULARY 30 mg by Infiltration route as needed. Uses via irrigation prn purulent nasal drainage    [provider]  NONFORMULARY OR COMPOUNDED ITEM Take 6 capsules by mouth daily.    [provider]  ondansetron  (ZOFRAN -ODT) 4 MG disintegrating tablet Take 1 tablet (4 mg total) by mouth every 8 (eight) hours as needed for nausea or  vomiting. Patient not taking: Reported on 01/23/2023 05/23/22   Randol Dawes, MD  scopolamine  (TRANSDERM SCOP , 1.5 MG,) 1 MG/3DAYS Place 1 patch (1.5 mg total) onto the skin every 3 (three) days. 01/29/24   Randol Dawes, MD  sennosides-docusate sodium  (SENOKOT-S) 8.6-50 MG tablet Take 1 tablet by mouth daily.    [provider]  TOPAMAX 25 MG tablet Take 50 mg by mouth daily. 03/03/22   [provider]  TURMERIC CURCUMIN PO Take 3 capsules by mouth daily at 6 (six) AM.    [provider]  valACYclovir  (VALTREX ) 1000 MG tablet TAKE 2 TABLETS AT ONSET OF COLD SORE, REPEAT ONCE IN 12 HOURS (4 TABLETS A COURSE) 08/25/23   Randol Dawes, MD  vitamin E 1000 UNIT capsule Take 1,000 Units by mouth daily.    [provider]  zinc gluconate 50 MG tablet Take 50 mg by mouth daily.    [provider]    Allergies: Nitrofurantoin  and Sulfa antibiotics    Review of Systems  Constitutional:  Negative for fever.  Respiratory:  Negative for shortness of breath.   Cardiovascular:  Positive for palpitations.  Gastrointestinal:  Positive for abdominal pain, diarrhea, nausea and vomiting.  All other systems reviewed and are negative.   Updated Vital Signs BP 129/87   Pulse (!) 113   Temp 97.7 F (36.5 C) (Axillary)   Resp 20   LMP 01/10/2013   SpO2 100%   Physical Exam Vitals and nursing note reviewed.  Constitutional:      Appearance: She is well-developed. She is not ill-appearing.  HENT:     Head: Normocephalic and atraumatic.     Mouth/Throat:     Comments: Mucous membranes dry Eyes:     Pupils: Pupils are equal, round, and reactive to light.  Cardiovascular:     Rate and Rhythm: Normal rate and regular rhythm.     Heart sounds: Normal heart sounds.  Pulmonary:     Effort: Pulmonary effort is normal. No respiratory distress.     Breath sounds: No wheezing.  Abdominal:     General: Bowel sounds are normal.     Palpations: Abdomen is soft.      Tenderness: There is no abdominal tenderness. There is no guarding or rebound.  Musculoskeletal:     Cervical back: Neck supple.  Skin:    General: Skin is warm and dry.  Neurological:     Mental Status: She is alert and oriented to person, place, and time.  Psychiatric:        Mood and Affect: Mood normal.     (all labs ordered are listed, but only abnormal results are displayed) Labs Reviewed  CBC - Abnormal; Notable for the following components:      Result Value   WBC 12.0 (*)    All other components within normal limits  LIPASE, BLOOD  COMPREHENSIVE METABOLIC PANEL  WITH GFR  URINALYSIS, ROUTINE W REFLEX MICROSCOPIC  I-STAT CG4 LACTIC ACID, ED  I-STAT CG4 LACTIC ACID, ED  TROPONIN I (HIGH SENSITIVITY)  TROPONIN I (HIGH SENSITIVITY)    EKG: None  Radiology: DG Chest 2 View Result Date: 02/06/2024 CLINICAL DATA:  Chest pain EXAM: CHEST - 2 VIEW COMPARISON:  None Available. FINDINGS: Heart size and pulmonary vascularity are normal. Lungs are clear. No pleural effusion or pneumothorax. Mediastinal contours appear intact. IMPRESSION: No active cardiopulmonary disease. Electronically Signed   By: Elsie Gravely M.D.   On: 02/06/2024 22:46    {Document cardiac monitor, telemetry assessment procedure when appropriate:32947} Procedures   Medications Ordered in the ED  sodium chloride  0.9 % bolus 1,000 mL (has no administration in time range)  meclizine  (ANTIVERT ) tablet 25 mg (has no administration in time range)  ondansetron  (ZOFRAN ) injection 4 mg (has no administration in time range)  promethazine  (PHENERGAN ) injection 12.5 mg (12.5 mg Intramuscular Given 02/06/24 2152)  HYDROmorphone  (DILAUDID ) injection 1 mg (1 mg Intravenous Given 02/06/24 2154)      {Click here for ABCD2, HEART and other calculators REFRESH Note before signing:1}                              Medical Decision Making Risk Prescription drug management.   ***  {Document critical care time when  appropriate  Document review of labs and clinical decision tools ie CHADS2VASC2, etc  Document your independent review of radiology images and any outside records  Document your discussion with family members, caretakers and with consultants  Document social determinants of health affecting pt's care  Document your decision making why or why not admission, treatments were needed:32947:::1}   Final diagnoses:  None    ED Discharge Orders     None

## 2024-02-07 ENCOUNTER — Encounter (HOSPITAL_COMMUNITY): Payer: Self-pay | Admitting: Emergency Medicine

## 2024-02-07 ENCOUNTER — Emergency Department (HOSPITAL_COMMUNITY)

## 2024-02-07 LAB — URINALYSIS, ROUTINE W REFLEX MICROSCOPIC
Bilirubin Urine: NEGATIVE
Glucose, UA: 50 mg/dL — AB
Hgb urine dipstick: NEGATIVE
Ketones, ur: 5 mg/dL — AB
Leukocytes,Ua: NEGATIVE
Nitrite: NEGATIVE
Protein, ur: NEGATIVE mg/dL
Specific Gravity, Urine: 1.028 (ref 1.005–1.030)
pH: 6 (ref 5.0–8.0)

## 2024-02-07 LAB — I-STAT CG4 LACTIC ACID, ED
Lactic Acid, Venous: 2.1 mmol/L (ref 0.5–1.9)
Lactic Acid, Venous: 2.4 mmol/L (ref 0.5–1.9)

## 2024-02-07 LAB — D-DIMER, QUANTITATIVE: D-Dimer, Quant: <0.27 ug{FEU}/mL (ref 0.00–0.50)

## 2024-02-07 LAB — TROPONIN I (HIGH SENSITIVITY): Troponin I (High Sensitivity): 4 ng/L (ref <18)

## 2024-02-07 MED ORDER — SODIUM CHLORIDE 0.9 % IV BOLUS
1000.0000 mL | Freq: Once | INTRAVENOUS | Status: AC
  Administered 2024-02-07: 1000 mL via INTRAVENOUS

## 2024-02-07 MED ORDER — IOHEXOL 350 MG/ML SOLN
75.0000 mL | Freq: Once | INTRAVENOUS | Status: AC | PRN
  Administered 2024-02-07: 75 mL via INTRAVENOUS

## 2024-02-07 MED ORDER — PROCHLORPERAZINE EDISYLATE 10 MG/2ML IJ SOLN
10.0000 mg | Freq: Once | INTRAMUSCULAR | Status: AC
  Administered 2024-02-07: 10 mg via INTRAVENOUS
  Filled 2024-02-07: qty 2

## 2024-02-07 MED ORDER — PROMETHAZINE HCL 25 MG PO TABS: 25.0000 mg | ORAL_TABLET | Freq: Four times a day (QID) | ORAL | 0 refills | Status: AC | PRN

## 2024-02-07 MED ORDER — DIPHENHYDRAMINE HCL 50 MG/ML IJ SOLN
12.5000 mg | Freq: Once | INTRAMUSCULAR | Status: AC
  Administered 2024-02-07: 12.5 mg via INTRAVENOUS
  Filled 2024-02-07: qty 1

## 2024-02-07 NOTE — ED Notes (Signed)
 Pt complained of mild dizziness while doing orthostatic vs

## 2024-02-07 NOTE — Discharge Instructions (Signed)
 You were seen today for nausea.  You at times had an elevated heart rate.  This can sometimes reflect dehydration but you have been hydrated.  Make sure that you are eating small frequent meals and staying hydrated at home.  Take Phenergan  as needed for nausea.

## 2024-02-12 ENCOUNTER — Telehealth: Payer: Self-pay | Admitting: *Deleted

## 2024-02-12 ENCOUNTER — Ambulatory Visit: Admitting: Family Medicine

## 2024-02-12 ENCOUNTER — Encounter: Payer: Self-pay | Admitting: Family Medicine

## 2024-02-12 ENCOUNTER — Other Ambulatory Visit: Payer: Self-pay | Admitting: Family Medicine

## 2024-02-12 VITALS — BP 140/72 | HR 112 | Temp 97.8°F | Ht 64.0 in | Wt 143.0 lb

## 2024-02-12 DIAGNOSIS — G4700 Insomnia, unspecified: Secondary | ICD-10-CM

## 2024-02-12 DIAGNOSIS — I951 Orthostatic hypotension: Secondary | ICD-10-CM

## 2024-02-12 DIAGNOSIS — R110 Nausea: Secondary | ICD-10-CM

## 2024-02-12 DIAGNOSIS — R000 Tachycardia, unspecified: Secondary | ICD-10-CM

## 2024-02-12 DIAGNOSIS — F419 Anxiety disorder, unspecified: Secondary | ICD-10-CM

## 2024-02-12 DIAGNOSIS — K219 Gastro-esophageal reflux disease without esophagitis: Secondary | ICD-10-CM

## 2024-02-12 DIAGNOSIS — R42 Dizziness and giddiness: Secondary | ICD-10-CM

## 2024-02-12 DIAGNOSIS — R0989 Other specified symptoms and signs involving the circulatory and respiratory systems: Secondary | ICD-10-CM

## 2024-02-12 MED ORDER — LANSOPRAZOLE 30 MG PO CPDR: 30.0000 mg | DELAYED_RELEASE_CAPSULE | Freq: Every day | ORAL | 0 refills | Status: DC

## 2024-02-12 NOTE — Telephone Encounter (Signed)
 Copied from CRM [id number]. Topic: Clinical - Request for Lab/Test Order >> Feb 12, 2024  8:20 AM Carlatta H wrote: Reason for CRM: Patient would like lab orders for her appointment today at 2pm//She is scheduled for a hospital follow up and would like to speak with Dr Alexandro nurse  Called patient and discussed her coming in later for labs at appt.

## 2024-02-12 NOTE — Progress Notes (Signed)
 Chief Complaint  Patient presents with   Hospitalization Follow-up    Hospital follow up, she wasn't asking for any labs in particular. Feels like she has a sinus infection and/or the start of an ulcer.   Discussed the use of AI scribe software for clinical note transcription with the patient, who gave verbal consent to proceed.  Lauren Blanchard is a 53 year old female who presents with nausea, dizziness, and tachycardia, as a follow-up to a recent ER visit.  She recently returned from a trip to [place] .  On the trip TO [place]  she experienced severe motion sickness due to forgetting her patches and Dramamine. On the return trip, she used both, which prevented motion sickness.  After returning home on July 26th, she developed diarrhea on July 27th, persisting into July 28th. By the evening of July 28th, she felt nauseated, and by July 29th, she was unable to stand, felt breathless, and had tachycardia, leading to an ER visit.  In the ER, she received anti-nausea medications (multiple doses--zofran , compazine , phenergan , meclizine ) and Dilaudid  for pain, along with two liters of fluids for orthostatic tachycardia. A CT scan showed incidental aortic atherosclerosis, no findings to account for her symptoms. Labs indicated elevated white blood cell count, lactic acid, and blood sugar. She was discharged with prescription for phenergan --makes her too sleepy, can only take at night.  Zofran  was ineffective in treating her nausea.  EKG sinus tach Labs reviewed--normal D-dimer. C-met normal except CO2 19, glu 208. Normal lipase. WBC 12, rest of CBC okay Negative troponin x2 Elevated lactic acid, improved on recheck Component Ref Range & Units (hover) 5 d ago (02/07/24) 6 d ago (02/06/24) 8 yr ago (08/23/15)  Lactic Acid, Venous 2.1 High Panic  2.4 High Panic  1.50 R    Currently, she continues to experience nausea, dizziness, and shakiness, though less severe. She hasn't had any further dry  heaves since ER, just nausea, some loose bowel movements, and chills without fever. Symptoms worsen when she feels very hot (room with no AC).  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  Turning head, focusing bothered her, behind in work, trouble focusing. Some vertigo today  She has been eating a bland diet--applesauce, yogurt, chicken. Worries about having an ulcer, feels like she needs to eat all the time. She takes OTC prevacid  daily for reflux, no known ulcer. Denies black stools. She has cut back on her ibuprofen  intake.  She has chronic issues with her sinuses, under the care of Dr. Georgina. She gets green globs when using navage. 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 She is using a saline spray mixed with mupirocin for green mucus discharge, and is on prednisone  for sinus congestion (20 mg daily x 7 days, started 7/31 by Dr. Georgina), for increased head congestion and dizziness.      PMH, PSH, SH reviewed  Outpatient Encounter Medications as of 02/12/2024  Medication Sig Note   acetaminophen  (TYLENOL ) 325 MG tablet Take 650 mg by mouth every 6 (six) hours as needed. 02/12/2024: Took 2 this am   ALPRAZolam  (XANAX ) 0.5 MG tablet TAKE 1/2-1 TABLET (0.25-0.5MG ) BY MOUTH 3 (THREE) TIMES DAILY AS NEEDED FOR ANXIETY OR SLEEP. 02/12/2024: Took 1/2 last night   azelastine (ASTELIN) 0.1 % nasal spray Place 1 spray into both nostrils daily at 6 (six) AM.    azelastine (OPTIVAR) 0.05 % ophthalmic solution Apply 1 drop to eye 2 (two) times daily.    b complex vitamins capsule  Take 2 capsules by mouth daily. 02/12/2024: Has not taken in the last week    cholecalciferol (VITAMIN D ) 1000 UNITS tablet Take 3,000 Units by mouth daily.    estradiol (ESTRACE) 0.1 MG/GM vaginal cream Place 1 Applicatorful vaginally once a week. 07/10/2023: Twice weekly   estradiol (VIVELLE-DOT) 0.075 MG/24HR Place 1 patch onto the skin 2 (two) times a week.    fexofenadine-pseudoephedrine (ALLEGRA-D 24) 180-240  MG per 24 hr tablet Take 1 tablet by mouth daily. 02/12/2024: Take daily   lansoprazole  (PREVACID ) 15 MG capsule Take 15 mg by mouth daily.    lubiprostone  (AMITIZA ) 8 MCG capsule TAKE 1 CAPSULE TWICE A DAY WITH MEALS    Melatonin 3 MG CAPS Take 12 mg by mouth at bedtime.    NON FORMULARY 30 mg by Infiltration route as needed. Uses via irrigation prn purulent nasal drainage 01/23/2023: saline   predniSONE  (DELTASONE ) 20 MG tablet Take 20 mg by mouth daily with breakfast.    promethazine  (PHENERGAN ) 25 MG tablet Take 1 tablet (25 mg total) by mouth every 6 (six) hours as needed for nausea or vomiting. 02/12/2024: Taking 1 per night   sennosides-docusate sodium  (SENOKOT-S) 8.6-50 MG tablet Take 1 tablet by mouth daily. 02/12/2024: Takes 3 each night   TOPAMAX 25 MG tablet Take 50 mg by mouth daily.    zinc gluconate 50 MG tablet Take 50 mg by mouth daily.    Ascorbic Acid (VITAMIN C) 1000 MG tablet Take 2,000 mg by mouth daily. (Patient not taking: Reported on 02/12/2024) 02/12/2024: Has not taken in the last week   BIOTIN PO Take 30,000 mg by mouth daily. (Patient not taking: Reported on 02/12/2024) 02/12/2024: Has not taken in the last week    EVENING PRIMROSE OIL PO Take 300 mg by mouth daily at 6 (six) AM. (Patient not taking: Reported on 02/12/2024) 02/12/2024: Has not taken in the last week    ibuprofen  (ADVIL ) 200 MG tablet Take 200 mg by mouth every 6 (six) hours as needed. (Patient not taking: Reported on 02/12/2024)    NONFORMULARY OR COMPOUNDED ITEM Take 6 capsules by mouth daily. (Patient not taking: Reported on 02/12/2024) 01/23/2023: Balance of Nature Fruits and Veggie Vitamins (3 fruits and 3 Veggies per daily)   ondansetron  (ZOFRAN -ODT) 4 MG disintegrating tablet Take 1 tablet (4 mg total) by mouth every 8 (eight) hours as needed for nausea or vomiting. (Patient not taking: Reported on 02/12/2024) 07/10/2023: As needed-does not like to take due to constipation   scopolamine  (TRANSDERM SCOP , 1.5 MG,) 1 MG/3DAYS  Place 1 patch (1.5 mg total) onto the skin every 3 (three) days. (Patient not taking: Reported on 02/12/2024)    TURMERIC CURCUMIN PO Take 3 capsules by mouth daily at 6 (six) AM. (Patient not taking: Reported on 02/12/2024) 02/12/2024: Has not taken in the last week    valACYclovir  (VALTREX ) 1000 MG tablet TAKE 2 TABLETS AT ONSET OF COLD SORE, REPEAT ONCE IN 12 HOURS (4 TABLETS A COURSE) (Patient not taking: Reported on 02/12/2024)    vitamin E 1000 UNIT capsule Take 1,000 Units by mouth daily. (Patient not taking: Reported on 02/12/2024) 02/12/2024: Has not taken in the last week    [DISCONTINUED] benzonatate  (TESSALON ) 200 MG capsule Take 1 capsule (200 mg total) by mouth 3 (three) times daily as needed for cough. (Patient not taking: Reported on 02/12/2024)    [DISCONTINUED] Dextromethorphan HBr (DELSYM PO) Take 15 mLs by mouth as needed. (Patient not taking: Reported on 07/10/2023)    [  DISCONTINUED] DM-Acetaminophen -Triprolidine (DELSYM NIGHTTIME COUGH MAX STR PO) Take 15 mLs by mouth as needed. (Patient not taking: Reported on 07/10/2023)    [DISCONTINUED] fluticasone (FLONASE) 50 MCG/ACT nasal spray Place 2 sprays into the nose daily.    No facility-administered encounter medications on file as of 02/12/2024.   Allergies  Allergen Reactions   Nitrofurantoin      rash   Sulfa Antibiotics Rash    ROS: no fever, + chills.  +nausea, stomach upset.  Diarrhea has improved, still occasionally loose. No vomiting, no melena or hematochezia. +congestion, vertigo, discolored mucus after use of navage. No cough, shortness of breath. See HPI    PHYSICAL EXAM:  BP (!) 140/72 (BP Location: Right Arm)   Pulse (!) 112   Temp 97.8 F (36.6 C) (Tympanic)   Ht 5' 4 (1.626 m)   Wt 143 lb (64.9 kg)   LMP 01/10/2013   BMI 24.55 kg/m   Wt Readings from Last 3 Encounters:  02/12/24 143 lb (64.9 kg)  02/07/24 140 lb (63.5 kg)  07/10/23 148 lb (67.1 kg)   Pleasant female, laying down, in no distress.  Some  trouble explaining what she feels. HEENT: conjunctiva and sclera are clear, EOMI. TM's and EAC's normal. Nasal mucosa is only slightly edematous on the left, neither have any erythema or purulence. Sinuses nontender. OP is clear Neck: no lymphadenopathy, thyromegaly or mass Heart: regular rate and rhythm, Pulse 90, no murmur Lungs: clear bilaterally Back: no spinal or CVA tenderness Abdomen: mild epigastric and LUQ tenderness, no rebound or guarding, negative Murphy sign. Extremities: no edema Skin: normal turgor, no rash Psych: normal mood, affect, hygiene and grooming   ASSESSMENT/PLAN:  Tachycardia - mainly with standing/orthostatic. Cont adequate hydration. Recheck labs - Plan: CBC with Differential, Basic Metabolic Panel, TSH  Nausea - without vomiting. Ddx reviewed.  Poss reflux contributing (though feels worse standing). Increase PPI to rx strength - Plan: CBC with Differential, Basic Metabolic Panel  Orthostatic hypotension - borderline. Encouraged adequate hydration  Gastroesophageal reflux disease without esophagitis - increase PPI to 30mg  for 2-4 weeks, limit NSAIDs.  f/u with GI for persistent nausea - Plan: lansoprazole  (PREVACID ) 30 MG capsule  Chronic sinus complaints - f/u with Dr. Georgina if not improving  Vertigo - mild, recently started.  continue current meds, meclizine  prn  Nonfasting--chocolate shake prior to visit  F/u 2 weeks  Nausea and vomiting with orthostatic tachycardia and hypotension, dehydration, and acute diarrhea Nausea, vomiting, and orthostatic tachycardia with hypotension likely due to dehydration. ER workup showed elevated WBC and lactic acid, trending down. Differential includes viral gastroenteritis or motion sickness exacerbation. - Repeat CBC to check WBC count. - Repeat electrolytes to assess hydration. - Check thyroid function tests to rule out hyperthyroidism. - Prescribe pantoprazole  30 mg once daily before breakfast for one month. -  Advise GI follow-up if symptoms persist for potential endoscopy. - Schedule follow-up in two weeks to reassess symptoms.  Gastroesophageal reflux disease (GERD) GERD managed with OTC Prevacid , effective in controlling symptoms. Consideration of increasing dose due to current symptoms. - Prescribe pantoprazole  30 mg once daily before breakfast for one month, replacing OTC Prevacid . - Advise taking full dose in the evening if needed.  Chronic sinusitis Ongoing sinus issues with limited efficacy from saline rinses with mupirocin. Prednisone  taper providing some relief. Concern about antibiotic resistance. - Advise ENT follow-up for further management and potential antibiotic therapy. - Continue saline rinses with mupirocin.  Aortic atherosclerosis Incidental finding of aortic atherosclerosis. Discussion about  potential statin therapy due to high cholesterol and atherosclerosis. - Provide educational handout on atherosclerosis. - Discuss potential statin therapy at next visit. - Consider follow-up in two to three weeks to discuss cholesterol management and repeat lipid panel.  Recording duration: 42 minutes Total duration of visit, including chart review and documentation 50 minutes

## 2024-02-12 NOTE — Patient Instructions (Signed)
 VISIT SUMMARY:  Today, you were seen for nausea, dizziness, and tachycardia following a recent ER visit. We discussed your symptoms, including dehydration, GERD, chronic sinusitis, and an incidental finding of aortic atherosclerosis.  YOUR PLAN:  NAUSEA AND VOMITING WITH ORTHOSTATIC TACHYCARDIA AND HYPOTENSION, DEHYDRATION: The ER visit showed elevated white blood cell count and lactic acid, which are now trending down.  -We will repeat your CBC to check your white blood cell count and repeat electrolytes to assess your hydration. WBC may be high due to current prednisone  use -We will check your thyroid function to rule out hyperthyroidism. -You will take pantoprazole  30 mg once daily before breakfast (for at least 2 weeks, for up to one month.) -If your symptoms persist, follow up with a GI specialist. -Schedule a follow-up appointment in two weeks to reassess your symptoms.  GASTROESOPHAGEAL REFLUX DISEASE (GERD): Your GERD is currently managed with OTC Prevacid , but we are temporarily increasing the dose due to your current symptoms. -You will take pantoprazole  30 mg once daily before breakfast, replacing your OTC Prevacid .  CHRONIC SINUSITIS: You have ongoing sinus issues with limited relief from saline rinses with mupirocin. Prednisone  taper is providing some relief. Contact Dr. Georgina for further management if not improving.  AORTIC ATHEROSCLEROSIS: An incidental finding of aortic atherosclerosis was noted. We discussed the potential need for statin therapy due to high cholesterol and atherosclerosis. -We provided you with an educational handout on atherosclerosis. -We will discuss potential statin therapy and atherosclerosis at your next visit.  I encourage you to come FASTING to your next visit so that we can repeat your lipid panel.

## 2024-02-12 NOTE — Telephone Encounter (Signed)
Patient did request this 

## 2024-02-13 ENCOUNTER — Ambulatory Visit: Payer: Self-pay | Admitting: Family Medicine

## 2024-02-13 ENCOUNTER — Encounter: Payer: Self-pay | Admitting: Family Medicine

## 2024-02-13 DIAGNOSIS — R110 Nausea: Secondary | ICD-10-CM

## 2024-02-13 LAB — CBC WITH DIFFERENTIAL/PLATELET
Basophils Absolute: 0 x10E3/uL (ref 0.0–0.2)
Basos: 0 %
EOS (ABSOLUTE): 0 x10E3/uL (ref 0.0–0.4)
Eos: 0 %
Hematocrit: 41.1 % (ref 34.0–46.6)
Hemoglobin: 13.6 g/dL (ref 11.1–15.9)
Immature Grans (Abs): 0 x10E3/uL (ref 0.0–0.1)
Immature Granulocytes: 0 %
Lymphocytes Absolute: 0.9 x10E3/uL (ref 0.7–3.1)
Lymphs: 11 %
MCH: 32.2 pg (ref 26.6–33.0)
MCHC: 33.1 g/dL (ref 31.5–35.7)
MCV: 97 fL (ref 79–97)
Monocytes Absolute: 0.1 x10E3/uL (ref 0.1–0.9)
Monocytes: 2 %
Neutrophils Absolute: 7.1 x10E3/uL — ABNORMAL HIGH (ref 1.4–7.0)
Neutrophils: 87 %
Platelets: 364 x10E3/uL (ref 150–450)
RBC: 4.22 x10E6/uL (ref 3.77–5.28)
RDW: 12.2 % (ref 11.7–15.4)
WBC: 8.1 x10E3/uL (ref 3.4–10.8)

## 2024-02-13 LAB — BASIC METABOLIC PANEL WITH GFR
BUN/Creatinine Ratio: 20 (ref 9–23)
BUN: 15 mg/dL (ref 6–24)
CO2: 21 mmol/L (ref 20–29)
Calcium: 10 mg/dL (ref 8.7–10.2)
Chloride: 105 mmol/L (ref 96–106)
Creatinine, Ser: 0.76 mg/dL (ref 0.57–1.00)
Glucose: 154 mg/dL — ABNORMAL HIGH (ref 70–99)
Potassium: 3.9 mmol/L (ref 3.5–5.2)
Sodium: 142 mmol/L (ref 134–144)
eGFR: 93 mL/min/1.73 (ref >59)

## 2024-02-13 LAB — TSH: TSH: 0.285 u[IU]/mL — ABNORMAL LOW (ref 0.450–4.500)

## 2024-02-13 MED ORDER — ONDANSETRON 4 MG PO TBDP: 4.0000 mg | ORAL_TABLET | Freq: Three times a day (TID) | ORAL | 0 refills | Status: AC | PRN

## 2024-02-14 ENCOUNTER — Other Ambulatory Visit: Payer: Self-pay | Admitting: *Deleted

## 2024-02-14 DIAGNOSIS — R42 Dizziness and giddiness: Secondary | ICD-10-CM

## 2024-02-15 LAB — T3: T3, Total: 83 ng/dL (ref 71–180)

## 2024-02-15 LAB — T4, FREE: Free T4: 1.2 ng/dL (ref 0.82–1.77)

## 2024-02-15 LAB — THYROTROPIN RECEPTOR AUTOABS: Thyrotropin Receptor Ab: <1.1 IU/L (ref 0.00–1.75)

## 2024-02-15 LAB — SPECIMEN STATUS REPORT

## 2024-02-25 NOTE — Progress Notes (Unsigned)
 No chief complaint on file.  Patient presents for 2 week f/u from last visit, which was ER f/u, with complaints of fatigue, diarrhea, nausea, tachycardia.  She sent a note since her last visit reporting ongoing sensitivity to heat, diarrhea recurred, hair loss, fatigue , difficulty sleeping, mood swings, along with nausea. There was a positional component and some vertigo, and we referred her for PT for Epley maneuver. She was seen at [facility] on 8/7.   UPDATE--     Per last visit--***LIKELY CAN DELETE Currently, she continues to experience nausea, dizziness, and shakiness, though less severe. She hasn't had any further dry heaves since ER, just nausea, some loose bowel movements, and chills without fever. Symptoms worsen when she feels very hot (room with no AC).  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  Turning head, focusing bothered her, behind in work, trouble focusing. Some vertigo today  She has been eating a bland diet--applesauce, yogurt, chicken. Worries about having an ulcer, feels like she needs to eat all the time. She takes OTC prevacid  daily for reflux, no known ulcer. Denies black stools. She has cut back on her ibuprofen  intake.  She has chronic issues with her sinuses, under the care of Dr. Georgina. She gets green globs when using navage. 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 She is using a saline spray mixed with mupirocin for green mucus discharge, and is on prednisone  for sinus congestion (20 mg daily x 7 days, started 7/31 by Dr. Georgina), for increased head congestion and dizziness.      PMH, PSH, SH reviewed    ROS: no fever, + chills.  +nausea, stomach upset.  Diarrhea has improved, still occasionally loose. No vomiting, no melena or hematochezia. +congestion, vertigo, discolored mucus after use of navage. No cough, shortness of breath. See HPI ***UPDATE ALL    PHYSICAL EXAM:  LMP 01/10/2013   Wt Readings from Last 3  Encounters:  02/12/24 143 lb (64.9 kg)  02/07/24 140 lb (63.5 kg)  07/10/23 148 lb (67.1 kg)   Pleasant female, laying down, in no distress.  Some trouble explaining what she feels. HEENT: conjunctiva and sclera are clear, EOMI. TM's and EAC's normal. Nasal mucosa is only slightly edematous on the left, neither have any erythema or purulence. Sinuses nontender. OP is clear Neck: no lymphadenopathy, thyromegaly or mass Heart: regular rate and rhythm, Pulse 90, no murmur Lungs: clear bilaterally Back: no spinal or CVA tenderness Abdomen: mild epigastric and LUQ tenderness, no rebound or guarding, negative Murphy sign. Extremities: no edema Skin: normal turgor, no rash Psych: normal mood, affect, hygiene and grooming  ***UPDATE NOSE   ASSESSMENT/PLAN:

## 2024-02-26 ENCOUNTER — Ambulatory Visit (INDEPENDENT_AMBULATORY_CARE_PROVIDER_SITE_OTHER): Admitting: Family Medicine

## 2024-02-26 ENCOUNTER — Encounter: Payer: Self-pay | Admitting: Family Medicine

## 2024-02-26 VITALS — BP 126/76 | HR 80 | Ht 64.0 in | Wt 145.2 lb

## 2024-02-26 DIAGNOSIS — R7989 Other specified abnormal findings of blood chemistry: Secondary | ICD-10-CM

## 2024-02-26 DIAGNOSIS — K219 Gastro-esophageal reflux disease without esophagitis: Secondary | ICD-10-CM | POA: Diagnosis not present

## 2024-02-26 DIAGNOSIS — I700 Atherosclerosis of aorta: Secondary | ICD-10-CM

## 2024-02-26 DIAGNOSIS — E7800 Pure hypercholesterolemia, unspecified: Secondary | ICD-10-CM

## 2024-02-26 DIAGNOSIS — H8110 Benign paroxysmal vertigo, unspecified ear: Secondary | ICD-10-CM

## 2024-02-26 DIAGNOSIS — T753XXD Motion sickness, subsequent encounter: Secondary | ICD-10-CM

## 2024-02-26 MED ORDER — LANSOPRAZOLE 30 MG PO CPDR: 30.0000 mg | DELAYED_RELEASE_CAPSULE | Freq: Every day | ORAL | 0 refills | Status: AC

## 2024-02-26 NOTE — Patient Instructions (Signed)
 Take the meclizine  (dramamine that you have) if needed for vertigo--use 1/2 pill if it makes you too drowsy. You can try using the meclizine  and/or the patches if needed for motion sickness.  We are referring you for a CT coronary scan to evaluate your coronary arteries.  Statin medication is recommended due to the aortic atherosclerosis and high cholesterol.  The calcium score will tell us  if there is buildup in the coronary arteries, and if we need to be more aggressive in our treatment (ie higher strength/dose of statin medication).  Continue to try and follow a low cholesterol diet. Return for fasting labs so that we can recheck your cholesterol panel, when NOT around a vacation or holiday.  I refilled your prevacid  for another month. If you have ongoing issues with your stomach, you should schedule an appointment with your GI doctor to further discuss.

## 2024-03-12 ENCOUNTER — Other Ambulatory Visit

## 2024-03-12 DIAGNOSIS — R7989 Other specified abnormal findings of blood chemistry: Secondary | ICD-10-CM

## 2024-03-12 DIAGNOSIS — E7800 Pure hypercholesterolemia, unspecified: Secondary | ICD-10-CM

## 2024-03-13 ENCOUNTER — Ambulatory Visit: Payer: Self-pay | Admitting: Family Medicine

## 2024-03-13 LAB — LIPID PANEL
Chol/HDL Ratio: 3.7 ratio (ref 0.0–4.4)
Cholesterol, Total: 213 mg/dL — ABNORMAL HIGH (ref 100–199)
HDL: 58 mg/dL (ref >39)
LDL Chol Calc (NIH): 134 mg/dL — ABNORMAL HIGH (ref 0–99)
Triglycerides: 121 mg/dL (ref 0–149)
VLDL Cholesterol Cal: 21 mg/dL (ref 5–40)

## 2024-03-13 LAB — TSH: TSH: 0.897 u[IU]/mL (ref 0.450–4.500)

## 2024-03-15 ENCOUNTER — Ambulatory Visit (HOSPITAL_BASED_OUTPATIENT_CLINIC_OR_DEPARTMENT_OTHER)
Admission: RE | Admit: 2024-03-15 | Discharge: 2024-03-15 | Disposition: A | Discharge status: Home / Self Care | Payer: Self-pay | Source: Ambulatory Visit | Attending: Family Medicine | Admitting: Family Medicine

## 2024-03-15 DIAGNOSIS — I700 Atherosclerosis of aorta: Secondary | ICD-10-CM | POA: Insufficient documentation

## 2024-03-15 DIAGNOSIS — E7800 Pure hypercholesterolemia, unspecified: Secondary | ICD-10-CM | POA: Insufficient documentation

## 2024-03-18 ENCOUNTER — Encounter: Payer: Self-pay | Admitting: Family Medicine

## 2024-03-22 ENCOUNTER — Other Ambulatory Visit: Payer: Self-pay | Admitting: Family Medicine

## 2024-03-22 DIAGNOSIS — E7800 Pure hypercholesterolemia, unspecified: Secondary | ICD-10-CM

## 2024-04-04 ENCOUNTER — Other Ambulatory Visit: Payer: Self-pay | Admitting: Family Medicine

## 2024-04-04 DIAGNOSIS — K219 Gastro-esophageal reflux disease without esophagitis: Secondary | ICD-10-CM

## 2024-06-03 ENCOUNTER — Encounter: Payer: Self-pay | Admitting: Gastroenterology

## 2024-06-03 ENCOUNTER — Ambulatory Visit: Admitting: Gastroenterology

## 2024-06-03 VITALS — BP 130/68 | HR 85 | Ht 65.0 in | Wt 151.5 lb

## 2024-06-03 DIAGNOSIS — K582 Mixed irritable bowel syndrome: Secondary | ICD-10-CM

## 2024-06-03 DIAGNOSIS — Z860101 Personal history of adenomatous and serrated colon polyps: Secondary | ICD-10-CM | POA: Diagnosis not present

## 2024-06-03 DIAGNOSIS — K649 Unspecified hemorrhoids: Secondary | ICD-10-CM | POA: Diagnosis not present

## 2024-06-03 DIAGNOSIS — R110 Nausea: Secondary | ICD-10-CM

## 2024-06-03 DIAGNOSIS — K219 Gastro-esophageal reflux disease without esophagitis: Secondary | ICD-10-CM | POA: Diagnosis not present

## 2024-06-03 NOTE — Progress Notes (Unsigned)
 Lauren Blanchard [id number] 02/05/1971   Chief Complaint: Discuss colonoscopy  Referring Provider: Randol Dawes, MD Primary GI MD: Dr. Shila  HPI: Lauren Blanchard is a 53 y.o. female with past medical history of GERD, IBS constipation predominant, IDA, Mnire disease, hysterectomy who presents today to discuss colonoscopy.    Normal CBC in August.   Discussed the use of AI scribe software for clinical note transcription with the patient, who gave verbal consent to proceed.  History of Present Illness Lauren Blanchard is a 53 year old female with a history of colon polyps and irritable bowel syndrome who presents for a follow-up colonoscopy.  Colonoscopy planning and bowel preparation intolerance - Desires to schedule follow-up colonoscopy before end of year due to insurance coverage - Expresses concern regarding bowel preparation process, describing previous experience as painful with significant gas discomfort - Prior use of a different bowel prep resulted in inadequate colon cleansing, necessitating repeat procedure  Altered bowel habits and irritable bowel syndrome symptoms - History of irritable bowel syndrome with mixed bowel habits, predominantly constipation - Manages symptoms with dietary modifications, including raisin bread, and multiple stool softeners - Excessive dietary fiber worsens gastrointestinal symptoms - Intermittent rectal pain with bowel movements - Uses Tucks pads as needed for hemorrhoidal discomfort  Gastrointestinal symptoms - Occasional nausea, associated with an episode of vertigo in late summer - History of gastroesophageal reflux disease, currently asymptomatic while taking Prevacid  - No current heartburn or acid reflux symptoms   Previous GI Procedures/Imaging   Colonoscopy 06/11/2021 - Five 4 to 7 mm polyps in the descending colon, in the ascending colon and in the cecum, removed with a cold snare. Resected and retrieved.  -  Diverticulosis in the sigmoid colon.  - Non-bleeding external and internal hemorrhoids. - Recall 3 years Path: 1. Surgical [P], colon, ascending and cecum, polyp (4) TUBULAR ADENOMAS AND A SESSILE SERRATED POLYP WITHOUT CYTOLOGIC DYSPLASIA. NEGATIVE FOR HIGH-GRADE DYSPLASIA. 2. Surgical [P], colon, descending, polyp (1) HYPERPLASTIC POLYP. NEGATIVE FOR DYSPLASIA.  Past Medical History:  Diagnosis Date   Abnormal mammogram of right breast 01/06/2017   Epigastric pain 09/2012   Takes Prevacid  (pain resolved)   GERD (gastroesophageal reflux disease)    on meds   Hemorrhagic ovarian cyst 08/2015   right   IBS (irritable bowel syndrome)    constipation predominant   Iron deficiency anemia 2014   hx of   Meniere disease 2001   chronic tinnitus; no vertigo since limiting sodium intake   Migraine headache without aura    PMDD (premenstrual dysphoric disorder)    hx of   PONV (postoperative nausea and vomiting)    has had PONV with most of her past surgeries   Seasonal allergies    Vertigo     Past Surgical History:  Procedure Laterality Date   ABDOMINOPLASTY  06/2019   BILATERAL SALPINGECTOMY Bilateral 01/17/2013   Procedure: BILATERAL SALPINGECTOMY;  Surgeon: Donna Just, DO;  Location: [facility];  Service: Gynecology;  Laterality: Bilateral;   BREAST LUMPECTOMY WITH RADIOACTIVE SEED LOCALIZATION Right 01/06/2017   Procedure: RIGHT BREAST LUMPECTOMY WITH RADIOACTIVE SEED LOCALIZATION;  Surgeon: Gail Favorite, MD;  Location: [facility];  Service: General;  Laterality: Right;   CESAREAN SECTION     x2: 2001 and 2004   COLONOSCOPY     at age 70 per pt-very painful procedure and painful air afterwards   COLONOSCOPY  02/2021   poor prep-constipation   CYSTOSCOPY  01/17/2013   Procedure: CYSTOSCOPY;  Surgeon:  Donna Just, DO;  Location: [facility];  Service: Gynecology;;   LEEP  1997   MANDIBLE SURGERY  2007   NASAL SINUS SURGERY     x3   OTHER SURGICAL  HISTORY  2006   ROBOTIC ASSISTED TOTAL HYSTERECTOMY N/A 01/17/2013   Procedure: ROBOTIC ASSISTED TOTAL HYSTERECTOMY;  Surgeon: Donna Just, DO;  Location: [facility];  Service: Gynecology;  Laterality: N/A;   TUBAL LIGATION  2004   WISDOM TOOTH EXTRACTION      Current Outpatient Medications  Medication Sig Dispense Refill   acetaminophen  (TYLENOL ) 325 MG tablet Take 650 mg by mouth every 6 (six) hours as needed.     ALPRAZolam  (XANAX ) 0.5 MG tablet TAKE 1/2-1 TABLET (0.25-0.5MG ) BY MOUTH 3 (THREE) TIMES DAILY AS NEEDED FOR ANXIETY OR SLEEP. (Patient taking differently: as needed.) 10 tablet 0   Ascorbic Acid (VITAMIN C) 1000 MG tablet Take 2,000 mg by mouth daily.     azelastine (ASTELIN) 0.1 % nasal spray Place 1 spray into both nostrils daily at 6 (six) AM.     azelastine (OPTIVAR) 0.05 % ophthalmic solution Apply 1 drop to eye 2 (two) times daily.     b complex vitamins capsule Take 2 capsules by mouth daily.     BIOTIN PO Take 10,000 mg by mouth daily.     cholecalciferol (VITAMIN D ) 1000 UNITS tablet Take 3,000 Units by mouth daily.     estradiol (ESTRACE) 0.1 MG/GM vaginal cream Place 1 Applicatorful vaginally once a week.     estradiol (VIVELLE-DOT) 0.075 MG/24HR Place 1 patch onto the skin 2 (two) times a week.     EVENING PRIMROSE OIL PO Take 300 mg by mouth daily at 6 (six) AM.     fexofenadine-pseudoephedrine (ALLEGRA-D 24) 180-240 MG per 24 hr tablet Take 1 tablet by mouth daily.     lansoprazole  (PREVACID ) 30 MG capsule Take 1 capsule (30 mg total) by mouth daily before breakfast. 30 capsule 0   lubiprostone  (AMITIZA ) 8 MCG capsule TAKE 1 CAPSULE TWICE A DAY WITH MEALS 180 capsule 3   Melatonin 3 MG CAPS Take 12 mg by mouth at bedtime.     NON FORMULARY 30 mg by Infiltration route as needed. Uses via irrigation prn purulent nasal drainage     NONFORMULARY OR COMPOUNDED ITEM Take 6 capsules by mouth daily.     ondansetron  (ZOFRAN -ODT) 4 MG disintegrating tablet Take 1 tablet (4  mg total) by mouth every 8 (eight) hours as needed for nausea or vomiting. 20 tablet 0   promethazine  (PHENERGAN ) 25 MG tablet Take 1 tablet (25 mg total) by mouth every 6 (six) hours as needed for nausea or vomiting. 30 tablet 0   scopolamine  (TRANSDERM SCOP , 1.5 MG,) 1 MG/3DAYS Place 1 patch (1.5 mg total) onto the skin every 3 (three) days. 24 patch 0   sennosides-docusate sodium  (SENOKOT-S) 8.6-50 MG tablet Take 1 tablet by mouth daily.     TOPAMAX 25 MG tablet Take 50 mg by mouth daily.     TURMERIC CURCUMIN PO Take 3 capsules by mouth daily at 6 (six) AM.     valACYclovir  (VALTREX ) 1000 MG tablet TAKE 2 TABLETS AT ONSET OF COLD SORE, REPEAT ONCE IN 12 HOURS (4 TABLETS A COURSE) 20 tablet 5   vitamin E 1000 UNIT capsule Take 1,000 Units by mouth daily.     zinc gluconate 50 MG tablet Take 50 mg by mouth daily.     No current facility-administered medications for this visit.  Allergies as of 06/03/2024 - Review Complete 06/03/2024  Allergen Reaction Noted   Nitrofurantoin   06/27/2022   Sulfa antibiotics Rash 05/24/2012    Family History  Problem Relation Age of Onset   Diabetes Mother    Hypertension Mother    Irritable bowel syndrome Mother    Alzheimer's disease Father 17   Diabetes Brother 103   Hypertension Maternal Grandmother    Hyperlipidemia Maternal Grandmother    Stroke Maternal Grandfather    Diabetes Maternal Grandfather    Diabetes Paternal Grandmother    Alzheimer's disease Paternal Grandmother    Heart disease Paternal Grandfather 26       died of MI   Turner syndrome Daughter    Depression Daughter    Colon cancer Other        mggf   Cancer Neg Hx    Esophageal cancer Neg Hx    Stomach cancer Neg Hx    Colon polyps Neg Hx    Rectal cancer Neg Hx     Social History   Tobacco Use   Smoking status: Never   Smokeless tobacco: Never  Vaping Use   Vaping status: Never Used  Substance Use Topics   Alcohol use: No   Drug use: No     Review of  Systems:    Constitutional: No weight loss, fever, chills, weakness or fatigue Eyes: No change in vision Ears, Nose, Throat:  No change in hearing or congestion Skin: No rash or itching Cardiovascular: No chest pain, chest pressure or palpitations   Respiratory: No SOB or cough Gastrointestinal: See HPI and otherwise negative Genitourinary: No dysuria or change in urinary frequency Neurological: No headache, dizziness or syncope Musculoskeletal: No new muscle or joint pain Hematologic: No bleeding or bruising    Physical Exam:  Vital signs: BP 130/68   Pulse 85   Ht 5' 5 (1.651 m)   Wt 151 lb 8 oz (68.7 kg)   LMP 01/10/2013   BMI 25.21 kg/m   Constitutional: NAD, Well developed, Well nourished, alert and cooperative Head:  Normocephalic and atraumatic.  Eyes: No scleral icterus. Conjunctiva pink. Mouth: No oral lesions. Respiratory: Respirations even and unlabored. Lungs clear to auscultation bilaterally.  No wheezes, crackles, or rhonchi.  Cardiovascular:  Regular rate and rhythm. No murmurs. No peripheral edema. Gastrointestinal:  Soft, nondistended, minimal tenderness to palpation of lower abdomen. No rebound or guarding. Normal bowel sounds. No appreciable masses or hepatomegaly. Rectal:  Not performed.  Neurologic:  Alert and oriented x4;  grossly normal neurologically.  Skin:   Dry and intact without significant lesions or rashes. Psychiatric: Oriented to person, place and time. Demonstrates good judgement and reason without abnormal affect or behaviors.   RELEVANT LABS AND IMAGING: CBC    Component Value Date/Time   WBC 8.1 02/12/2024 1516   WBC 12.0 (H) 02/06/2024 2147   RBC 4.22 02/12/2024 1516   RBC 4.47 02/06/2024 2147   HGB 13.6 02/12/2024 1516   HCT 41.1 02/12/2024 1516   PLT 364 02/12/2024 1516   MCV 97 02/12/2024 1516   MCH 32.2 02/12/2024 1516   MCH 31.8 02/06/2024 2147   MCHC 33.1 02/12/2024 1516   MCHC 34.0 02/06/2024 2147   RDW 12.2 02/12/2024  1516   LYMPHSABS 0.9 02/12/2024 1516   MONOABS 793 09/12/2016 0940   EOSABS 0.0 02/12/2024 1516   BASOSABS 0.0 02/12/2024 1516    CMP     Component Value Date/Time   NA 142 02/12/2024 1516   K  3.9 02/12/2024 1516   CL 105 02/12/2024 1516   CO2 21 02/12/2024 1516   GLUCOSE 154 (H) 02/12/2024 1516   GLUCOSE 208 (H) 02/06/2024 2147   BUN 15 02/12/2024 1516   CREATININE 0.76 02/12/2024 1516   CREATININE 0.61 09/12/2016 0940   CALCIUM 10.0 02/12/2024 1516   PROT 7.2 02/06/2024 2147   PROT 6.6 07/10/2023 1459   ALBUMIN 4.4 02/06/2024 2147   ALBUMIN 4.4 07/10/2023 1459   AST 26 02/06/2024 2147   ALT 25 02/06/2024 2147   ALKPHOS 49 02/06/2024 2147   BILITOT 0.6 02/06/2024 2147   BILITOT 0.3 07/10/2023 1459   GFRNONAA >60 02/06/2024 2147   GFRAA 120 06/08/2020 1058     Assessment/Plan:   Normal exam Assessment & Plan Personal history of colonic polyps - surveillance colonoscopy planning Due for surveillance colonoscopy post-December 2022. Family history of colon cancer. Previous inadequate bowel prep caused nausea and discomfort. - Scheduled surveillance colonoscopy after December 2nd, 2025, for insurance coverage. - Reviewed bowel prep options; consider extended prep if necessary. - Ensured transportation post-sedation. - Discussed colonoscopy risks: bleeding, perforation, sedation-related complications (<1%).  Irritable bowel syndrome with constipation predominance Symptoms include alternating constipation and diarrhea. Fiber exacerbates symptoms. Occasional rectal pain likely due to hemorrhoids. - Continue dietary adjustments and stool softeners. - Avoid excessive fiber intake.  Hemorrhoids Occasional rectal pain with bowel movements. - Continue over-the-counter treatments like tux pads as needed.  Gastroesophageal reflux disease (GERD) GERD well-controlled with Prevacid . - Continue Prevacid .  Nausea Intermittent nausea, possibly related to previous vertigo  episode. - Monitor symptoms and manage as needed.   Colonoscopy w/ Nandigam Determine prep - double Miralax prep Samples of IBgard   Camie Furbish, PA-C [facility] 06/03/2024, 3:11 PM  Patient Care Team: Randol Dawes, MD as PCP - General (Family Medicine)

## 2024-06-03 NOTE — Patient Instructions (Signed)
 IBgard samples provided for IBS.   You have been scheduled for a colonoscopy. Please follow written instructions given to you at your visit today.   If you use inhalers (even only as needed), please bring them with you on the day of your procedure.  DO NOT TAKE 7 DAYS PRIOR TO TEST- Trulicity (dulaglutide) Ozempic, Wegovy (semaglutide) Mounjaro, Zepbound (tirzepatide) Bydureon Bcise (exanatide extended release)  DO NOT TAKE 1 DAY PRIOR TO YOUR TEST Rybelsus (semaglutide) Adlyxin (lixisenatide) Victoza (liraglutide) Byetta (exanatide) ___________________________________________________________________________

## 2024-06-05 ENCOUNTER — Encounter: Payer: Self-pay | Admitting: Gastroenterology

## 2024-06-12 ENCOUNTER — Other Ambulatory Visit: Payer: Self-pay | Admitting: Family Medicine

## 2024-06-12 DIAGNOSIS — K581 Irritable bowel syndrome with constipation: Secondary | ICD-10-CM

## 2024-06-14 ENCOUNTER — Other Ambulatory Visit: Payer: Self-pay | Admitting: Family Medicine

## 2024-06-14 DIAGNOSIS — G4700 Insomnia, unspecified: Secondary | ICD-10-CM

## 2024-06-14 DIAGNOSIS — F419 Anxiety disorder, unspecified: Secondary | ICD-10-CM

## 2024-06-17 NOTE — Telephone Encounter (Signed)
 Is this okay to refill?

## 2024-06-21 ENCOUNTER — Telehealth: Payer: Self-pay

## 2024-06-21 DIAGNOSIS — T753XXD Motion sickness, subsequent encounter: Secondary | ICD-10-CM

## 2024-06-21 MED ORDER — SCOPOLAMINE 1 MG/3DAYS TD PT72: 1.0000 | MEDICATED_PATCH | TRANSDERMAL | 0 refills | Status: AC

## 2024-06-21 NOTE — Addendum Note (Signed)
 Addended by: Hashir Deleeuw on: 06/21/2024 02:12 PM   Modules accepted: Orders

## 2024-06-21 NOTE — Telephone Encounter (Signed)
 Copied from CRM #[id number]. Topic: Clinical - Prescription Issue >> Jun 21, 2024 11:35 AM Tinnie C wrote: Reason for CRM: Pt says she is unable to get a refill of scopolamine  (TRANSDERM SCOP , 1.5 MG,) 1 MG/3DAYS, pharmacy will not refill this because it was last sent to [place] . She says she needs a new rx for this sent to: [id number] [place], [place] - [address] AT [facility] [address] [place] [address] Phone: [phone] Fax: [phone]  States she needs it by next week, has 1 or 2 left.

## 2024-06-24 ENCOUNTER — Other Ambulatory Visit

## 2024-06-24 DIAGNOSIS — E7800 Pure hypercholesterolemia, unspecified: Secondary | ICD-10-CM

## 2024-06-24 LAB — LIPID PANEL
Chol/HDL Ratio: 2.7 ratio (ref 0.0–4.4)
Cholesterol, Total: 192 mg/dL (ref 100–199)
HDL: 70 mg/dL (ref >39)
LDL Chol Calc (NIH): 104 mg/dL — ABNORMAL HIGH (ref 0–99)
Triglycerides: 104 mg/dL (ref 0–149)
VLDL Cholesterol Cal: 18 mg/dL (ref 5–40)

## 2024-06-25 ENCOUNTER — Ambulatory Visit: Payer: Self-pay | Admitting: Family Medicine

## 2024-07-19 ENCOUNTER — Ambulatory Visit: Admitting: Gastroenterology

## 2024-07-19 ENCOUNTER — Encounter: Payer: Self-pay | Admitting: Gastroenterology

## 2024-07-19 VITALS — BP 98/68 | HR 62 | Temp 97.6°F | Resp 13 | Ht 65.0 in | Wt 151.8 lb

## 2024-07-19 DIAGNOSIS — K621 Rectal polyp: Secondary | ICD-10-CM

## 2024-07-19 DIAGNOSIS — K644 Residual hemorrhoidal skin tags: Secondary | ICD-10-CM

## 2024-07-19 DIAGNOSIS — D128 Benign neoplasm of rectum: Secondary | ICD-10-CM

## 2024-07-19 DIAGNOSIS — Z860101 Personal history of adenomatous and serrated colon polyps: Secondary | ICD-10-CM | POA: Diagnosis not present

## 2024-07-19 DIAGNOSIS — K648 Other hemorrhoids: Secondary | ICD-10-CM

## 2024-07-19 DIAGNOSIS — D120 Benign neoplasm of cecum: Secondary | ICD-10-CM

## 2024-07-19 DIAGNOSIS — Z1211 Encounter for screening for malignant neoplasm of colon: Secondary | ICD-10-CM

## 2024-07-19 DIAGNOSIS — D122 Benign neoplasm of ascending colon: Secondary | ICD-10-CM

## 2024-07-19 MED ORDER — SODIUM CHLORIDE 0.9 % IV SOLN: 500.0000 mL | INTRAVENOUS | Status: DC

## 2024-07-19 NOTE — Op Note (Signed)
 [facility] Patient Name: Lauren Blanchard Procedure Date: 07/19/2024 12:26 PM MRN: [id number] Endoscopist: Gustav ALONSO Mcgee , MD, [id number] Age: 54 Referring MD:  Date of Birth: Nov 04, 1970 Gender: Female Account #: [id number] Procedure:                Colonoscopy Indications:              High risk colon cancer surveillance: Personal                            history of colonic polyps, High risk colon cancer                            surveillance: Personal history of adenoma (10 mm or                            greater in size), High risk colon cancer                            surveillance: Personal history of multiple (3 or                            more) adenomas Medicines:                Monitored Anesthesia Care Procedure:                Pre-Anesthesia Assessment:                           - Prior to the procedure, a History and Physical                            was performed, and patient medications and                            allergies were reviewed. The patient's tolerance of                            previous anesthesia was also reviewed. The risks                            and benefits of the procedure and the sedation                            options and risks were discussed with the patient.                            All questions were answered, and informed consent                            was obtained. Prior Anticoagulants: The patient has                            taken no anticoagulant or antiplatelet agents. ASA  Grade Assessment: II - A patient with mild systemic                            disease. After reviewing the risks and benefits,                            the patient was deemed in satisfactory condition to                            undergo the procedure.                           After obtaining informed consent, the colonoscope                            was passed under direct vision. Throughout the                             procedure, the patient's blood pressure, pulse, and                            oxygen saturations were monitored continuously. The                            Olympus Scope SN: [id number] was introduced through                            the anus and advanced to the the cecum, identified                            by appendiceal orifice and ileocecal valve. The                            colonoscopy was performed without difficulty. The                            patient tolerated the procedure well. Scope In: 1:32:27 PM Scope Out: 1:54:27 PM Scope Withdrawal Time: 0 hours 16 minutes 54 seconds  Total Procedure Duration: 0 hours 22 minutes 0 seconds  Findings:                 The perianal and digital rectal examinations were                            normal.                           Two sessile polyps were found in the rectum and                            ascending colon. The polyps were 5 to 12 mm in                            size. These polyps were removed with a cold snare.  Resection and retrieval were complete.                           A 1 mm polyp was found in the cecum. The polyp was                            sessile. The polyp was removed with a cold biopsy                            forceps. Resection and retrieval were complete.                           Non-bleeding external and internal hemorrhoids were                            found during retroflexion. The hemorrhoids were                            medium-sized. Complications:            No immediate complications. Estimated Blood Loss:     Estimated blood loss was minimal. Impression:               - Two 5 to 12 mm polyps in the rectum and in the                            ascending colon, removed with a cold snare.                            Resected and retrieved.                           - One 1 mm polyp in the cecum, removed with a cold                             biopsy forceps. Resected and retrieved.                           - Non-bleeding external and internal hemorrhoids. Recommendation:           - Resume previous diet.                           - Continue present medications.                           - Await pathology results.                           - Repeat colonoscopy in 3 years for surveillance                            based on pathology results. Aggie Douse V. Hymen Arnett, MD 07/19/2024 2:06:46 PM This report has been signed electronically.

## 2024-07-19 NOTE — Progress Notes (Unsigned)
 Pt's states no medical or surgical changes since previsit or office visit.

## 2024-07-19 NOTE — Progress Notes (Unsigned)
 Called to room to assist during endoscopic procedure.  Patient ID and intended procedure confirmed with present staff. Received instructions for my participation in the procedure from the performing physician.

## 2024-07-19 NOTE — Progress Notes (Unsigned)
 Sedate, gd SR, tolerated procedure well, VSS, report to RN

## 2024-07-19 NOTE — Progress Notes (Unsigned)
 [facility] History and Physical   Primary Care Physician:  Randol Dawes, MD   Reason for Procedure:  History of adenomatous colon polyps  Plan:    Surveillance colonoscopy with possible interventions as needed     HPI: Lauren Blanchard is a very pleasant 54 y.o. female here for surveillance colonoscopy. Denies any nausea, vomiting, abdominal pain, melena or bright red blood per rectum  The risks and benefits as well as alternatives of endoscopic procedure(s) have been discussed and reviewed.  The patient was provided an opportunity to ask questions and all were answered. The patient agreed with the plan and demonstrated an understanding of the instructions.   Past Medical History:  Diagnosis Date   Abnormal mammogram of right breast 01/06/2017   Allergy    Epigastric pain 09/2012   Takes Prevacid  (pain resolved)   GERD (gastroesophageal reflux disease)    on meds   Hemorrhagic ovarian cyst 08/2015   right   IBS (irritable bowel syndrome)    constipation predominant   Iron deficiency anemia 2014   hx of   Meniere disease 2001   chronic tinnitus; no vertigo since limiting sodium intake   Migraine headache without aura    PMDD (premenstrual dysphoric disorder)    hx of   PONV (postoperative nausea and vomiting)    has had PONV with most of her past surgeries   Seasonal allergies    Vertigo     Past Surgical History:  Procedure Laterality Date   ABDOMINOPLASTY  06/2019   BILATERAL SALPINGECTOMY Bilateral 01/17/2013   Procedure: BILATERAL SALPINGECTOMY;  Surgeon: Donna Just, DO;  Location: [facility];  Service: Gynecology;  Laterality: Bilateral;   BREAST LUMPECTOMY WITH RADIOACTIVE SEED LOCALIZATION Right 01/06/2017   Procedure: RIGHT BREAST LUMPECTOMY WITH RADIOACTIVE SEED LOCALIZATION;  Surgeon: Gail Favorite, MD;  Location: [facility];  Service: General;  Laterality: Right;   CESAREAN SECTION     x2: 2001 and 2004   COLONOSCOPY     at  age 89 per pt-very painful procedure and painful air afterwards   COLONOSCOPY  02/2021   poor prep-constipation   CYSTOSCOPY  01/17/2013   Procedure: CYSTOSCOPY;  Surgeon: Donna Just, DO;  Location: [facility];  Service: Gynecology;;   LEEP  1997   MANDIBLE SURGERY  2007   NASAL SINUS SURGERY     x3   OTHER SURGICAL HISTORY  2006   ROBOTIC ASSISTED TOTAL HYSTERECTOMY N/A 01/17/2013   Procedure: ROBOTIC ASSISTED TOTAL HYSTERECTOMY;  Surgeon: Donna Just, DO;  Location: [facility];  Service: Gynecology;  Laterality: N/A;   TUBAL LIGATION  2004   WISDOM TOOTH EXTRACTION      Prior to Admission medications  Medication Sig Start Date End Date Taking? Authorizing Provider  Ascorbic Acid (VITAMIN C) 1000 MG tablet Take 2,000 mg by mouth daily.   Yes [provider]  azelastine (ASTELIN) 0.1 % nasal spray Place 1 spray into both nostrils daily at 6 (six) AM. 05/11/21  Yes [provider]  b complex vitamins capsule Take 2 capsules by mouth daily.   Yes [provider]  BIOTIN PO Take 10,000 mg by mouth daily.   Yes [provider]  cholecalciferol (VITAMIN D ) 1000 UNITS tablet Take 3,000 Units by mouth daily.   Yes [provider]  diphenhydrAMINE  (BENADRYL  ALLERGY) 25 mg capsule 25 mg.   Yes [provider]  estradiol (ESTRACE) 0.1 MG/GM vaginal cream Place 1 Applicatorful vaginally once a week. 05/11/21  Yes [provider]  estradiol (VIVELLE-DOT) 0.075 MG/24HR Place 1 patch onto the skin 2 (two) times a week. 01/18/21  Yes [provider]  fexofenadine-pseudoephedrine (ALLEGRA-D 24) 180-240 MG per 24 hr tablet Take 1 tablet by mouth daily.   Yes [provider]  lansoprazole  (PREVACID ) 30 MG capsule Take 1 capsule (30 mg total) by mouth daily before breakfast. 02/26/24  Yes Randol Dawes, MD  lubiprostone  (AMITIZA ) 8 MCG capsule TAKE 1 CAPSULE TWICE A DAY WITH MEALS 06/12/24  Yes Randol Dawes, MD  Melatonin 3 MG CAPS  Take 12 mg by mouth at bedtime.   Yes [provider]  NON FORMULARY 30 mg by Infiltration route as needed. Uses via irrigation prn purulent nasal drainage   Yes [provider]  ondansetron  (ZOFRAN -ODT) 4 MG disintegrating tablet Take 1 tablet (4 mg total) by mouth every 8 (eight) hours as needed for nausea or vomiting. 02/13/24  Yes Randol Dawes, MD  sennosides-docusate sodium  (SENOKOT-S) 8.6-50 MG tablet Take 1 tablet by mouth daily.   Yes [provider]  TOPAMAX 25 MG tablet Take 50 mg by mouth daily. 03/03/22  Yes [provider]  vitamin E 1000 UNIT capsule Take 1,000 Units by mouth daily.   Yes [provider]  zinc gluconate 50 MG tablet Take 50 mg by mouth daily.   Yes [provider]  acetaminophen  (TYLENOL ) 325 MG tablet Take 650 mg by mouth every 6 (six) hours as needed.    [provider]  ALPRAZolam  (XANAX ) 0.5 MG tablet TAKE 1/2-1 TABLET (0.25-0.5MG ) BY MOUTH 3 (THREE) TIMES DAILY AS NEEDED FOR ANXIETY OR SLEEP. 06/17/24   Randol Dawes, MD  azelastine (OPTIVAR) 0.05 % ophthalmic solution Apply 1 drop to eye 2 (two) times daily. 05/02/21   [provider]  EVENING PRIMROSE OIL PO Take 300 mg by mouth daily at 6 (six) AM.    [provider]  NONFORMULARY OR COMPOUNDED ITEM Take 6 capsules by mouth daily.    [provider]  promethazine  (PHENERGAN ) 25 MG tablet Take 1 tablet (25 mg total) by mouth every 6 (six) hours as needed for nausea or vomiting. 02/07/24   Horton, Charmaine FALCON, MD  scopolamine  (TRANSDERM SCOP , 1.5 MG,) 1 MG/3DAYS Place 1 patch (1 mg total) onto the skin every 3 (three) days. 06/21/24   Randol Dawes, MD  TURMERIC CURCUMIN PO Take 3 capsules by mouth daily at 6 (six) AM.    [provider]  valACYclovir  (VALTREX ) 1000 MG tablet TAKE 2 TABLETS AT ONSET OF COLD SORE, REPEAT ONCE IN 12 HOURS (4 TABLETS A COURSE) 08/25/23   Randol Dawes, MD  Vitamin K 200 MCG/0.2ML LIQD Vitamin K     [provider]    Current Outpatient Medications  Medication Sig Dispense Refill   Ascorbic Acid (VITAMIN C) 1000 MG tablet Take 2,000 mg by mouth daily.     azelastine (ASTELIN) 0.1 % nasal spray Place 1 spray into both nostrils daily at 6 (six) AM.     b complex vitamins capsule Take 2 capsules by mouth daily.     BIOTIN PO Take 10,000 mg by mouth daily.     cholecalciferol (VITAMIN D ) 1000 UNITS tablet Take 3,000 Units by mouth daily.     diphenhydrAMINE  (BENADRYL  ALLERGY) 25 mg capsule 25 mg.     estradiol (ESTRACE) 0.1 MG/GM vaginal cream Place 1 Applicatorful vaginally once a week.     estradiol (VIVELLE-DOT) 0.075 MG/24HR Place 1 patch onto the skin 2 (  two) times a week.     fexofenadine-pseudoephedrine (ALLEGRA-D 24) 180-240 MG per 24 hr tablet Take 1 tablet by mouth daily.     lansoprazole  (PREVACID ) 30 MG capsule Take 1 capsule (30 mg total) by mouth daily before breakfast. 30 capsule 0   lubiprostone  (AMITIZA ) 8 MCG capsule TAKE 1 CAPSULE TWICE A DAY WITH MEALS 180 capsule 0   Melatonin 3 MG CAPS Take 12 mg by mouth at bedtime.     NON FORMULARY 30 mg by Infiltration route as needed. Uses via irrigation prn purulent nasal drainage     ondansetron  (ZOFRAN -ODT) 4 MG disintegrating tablet Take 1 tablet (4 mg total) by mouth every 8 (eight) hours as needed for nausea or vomiting. 20 tablet 0   sennosides-docusate sodium  (SENOKOT-S) 8.6-50 MG tablet Take 1 tablet by mouth daily.     TOPAMAX 25 MG tablet Take 50 mg by mouth daily.     vitamin E 1000 UNIT capsule Take 1,000 Units by mouth daily.     zinc gluconate 50 MG tablet Take 50 mg by mouth daily.     acetaminophen  (TYLENOL ) 325 MG tablet Take 650 mg by mouth every 6 (six) hours as needed.     ALPRAZolam  (XANAX ) 0.5 MG tablet TAKE 1/2-1 TABLET (0.25-0.5MG ) BY MOUTH 3 (THREE) TIMES DAILY AS NEEDED FOR ANXIETY OR SLEEP. 10 tablet 0   azelastine (OPTIVAR) 0.05 % ophthalmic solution Apply 1 drop to eye 2 (two) times daily.      EVENING PRIMROSE OIL PO Take 300 mg by mouth daily at 6 (six) AM.     NONFORMULARY OR COMPOUNDED ITEM Take 6 capsules by mouth daily.     promethazine  (PHENERGAN ) 25 MG tablet Take 1 tablet (25 mg total) by mouth every 6 (six) hours as needed for nausea or vomiting. 30 tablet 0   scopolamine  (TRANSDERM SCOP , 1.5 MG,) 1 MG/3DAYS Place 1 patch (1 mg total) onto the skin every 3 (three) days. 24 patch 0   TURMERIC CURCUMIN PO Take 3 capsules by mouth daily at 6 (six) AM.     valACYclovir  (VALTREX ) 1000 MG tablet TAKE 2 TABLETS AT ONSET OF COLD SORE, REPEAT ONCE IN 12 HOURS (4 TABLETS A COURSE) 20 tablet 5   Vitamin K 200 MCG/0.2ML LIQD Vitamin K     Current Facility-Administered Medications  Medication Dose Route Frequency Provider Last Rate Last Admin   0.9 %  sodium chloride  infusion  500 mL Intravenous Continuous Darryel Diodato V, MD        Allergies as of 07/19/2024 - Review Complete 07/19/2024  Allergen Reaction Noted   Molds & smuts Cough and Other (See Comments) 07/19/2024   Short ragweed pollen ext Other (See Comments) 07/19/2024   Sulfa antibiotics Itching, Other (See Comments), and Rash 05/24/2012   Nitrofurantoin  Other (See Comments) and Itching 06/27/2022    Family History  Problem Relation Age of Onset   Diabetes Mother    Hypertension Mother    Irritable bowel syndrome Mother    Alzheimer's disease Father 22   Diabetes Brother 57   Hypertension Maternal Grandmother    Hyperlipidemia Maternal Grandmother    Stroke Maternal Grandfather    Diabetes Maternal Grandfather    Diabetes Paternal Grandmother    Alzheimer's disease Paternal Grandmother    Heart disease Paternal Grandfather 23       died of MI   Turner syndrome Daughter    Depression Daughter    Colon cancer Other  mggf   Cancer Neg Hx    Esophageal cancer Neg Hx    Stomach cancer Neg Hx    Colon polyps Neg Hx    Rectal cancer Neg Hx     Social History   Socioeconomic History   Marital  status: Married    Spouse name: Tim   Number of children: 3   Years of education: Not on file   Highest education level: Not on file  Occupational History   Occupation: sales executive  Tobacco Use   Smoking status: Never   Smokeless tobacco: Never  Vaping Use   Vaping status: Never Used  Substance and Sexual Activity   Alcohol use: No   Drug use: No   Sexual activity: Yes    Partners: Male    Birth control/protection: Surgical  Other Topics Concern   Not on file  Social History Narrative   Divorced and re-married.  Lives with husband, daughter Stefano.    Oldest daughter is married and living in [place]. Middle daughter Eliazar) got married and moved to [place].   1 dog, Malti-poo  (Ralphie 2018), turtle.  No tobacco exposure.   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, treating patients at nursing homes).--left this job in April 2024 to help care for her daughter Stefano (mental health issues), helps out in the office sporadically      Reviewed/updated 06/2023   Social Drivers of Health   Tobacco Use: Low Risk (07/19/2024)   Patient History    Smoking Tobacco Use: Never    Smokeless Tobacco Use: Never    Passive Exposure: Not on file  Financial Resource Strain: Low Risk (07/10/2023)   Overall Financial Resource Strain (CARDIA)    Difficulty of Paying Living Expenses: Not hard at all  Food Insecurity: No Food Insecurity (07/10/2023)   Hunger Vital Sign    Worried About Running Out of Food in the Last Year: Never true    Ran Out of Food in the Last Year: Never true  Transportation Needs: No Transportation Needs (07/10/2023)   PRAPARE - Administrator, Civil Service (Medical): No    Lack of Transportation (Non-Medical): No  Physical Activity: Sufficiently Active (07/10/2023)   Exercise Vital Sign    Days of Exercise per Week: 3 days    Minutes of Exercise per Session: 70 min  Stress: Stress Concern Present (07/10/2023)   [employer]  of Occupational Health - Occupational Stress Questionnaire    Feeling of Stress : To some extent  Social Connections: Moderately Integrated (07/10/2023)   Social Connection and Isolation Panel    Frequency of Communication with Friends and Family: Once a week    Frequency of Social Gatherings with Friends and Family: Once a week    Attends Religious Services: More than 4 times per year    Active Member of [employer] or Organizations: Yes    Attends Banker Meetings: More than 4 times per year    Marital Status: Married  Catering Manager Violence: Not At Risk (07/10/2023)   Humiliation, Afraid, Rape, and Kick questionnaire    Fear of Current or Ex-Partner: No    Emotionally Abused: No    Physically Abused: No    Sexually Abused: No  Depression (PHQ2-9): Medium Risk (07/10/2023)   Depression (PHQ2-9)    PHQ-2 Score: 7  Alcohol Screen: Not on file  Housing: Low Risk (07/10/2023)   Housing Stability Vital Sign    Unable to Pay for Housing in the  Last Year: No    Number of Times Moved in the Last Year: 0    Homeless in the Last Year: No  Utilities: Not At Risk (07/10/2023)   [facility] Utilities    Threatened with loss of utilities: No  Health Literacy: Not on file    Review of Systems:  All other review of systems negative except as mentioned in the HPI.  Physical Exam: Vital signs in last 24 hours: BP (!) 150/87   Pulse 81   Temp 97.6 F (36.4 C)   Resp 17   Ht 5' 5 (1.651 m)   Wt 151 lb 12.8 oz (68.9 kg)   LMP 01/10/2013   SpO2 100%   BMI 25.26 kg/m  General:   Alert, NAD Lungs:  Clear .   Heart:  Regular rate and rhythm Abdomen:  Soft, nontender and nondistended. Neuro/Psych:  Alert and cooperative. Normal mood and affect. A and O x 3  Reviewed labs, radiology imaging, old records and pertinent past GI work up  Patient is appropriate for planned procedure(s) and anesthesia in an ambulatory setting   K. Veena Dreyah Montrose , MD [phone]

## 2024-07-19 NOTE — Patient Instructions (Signed)

## 2024-07-22 ENCOUNTER — Telehealth: Payer: Self-pay | Admitting: *Deleted

## 2024-07-22 NOTE — Telephone Encounter (Signed)
" °  Follow up Call-     07/19/2024    1:02 PM  Call back number  Post procedure Call Back phone  # [phone]  Permission to leave phone message Yes     Patient questions:  Do you have a fever, pain , or abdominal swelling? No. Pain Score  0 *  Have you tolerated food without any problems? Yes.    Have you been able to return to your normal activities? Yes.    Do you have any questions about your discharge instructions: Diet   No. Medications  No. Follow up visit  No.  Do you have questions or concerns about your Care? No.  Actions: * If pain score is 4 or above: No action needed, pain <4.   "

## 2024-07-24 DIAGNOSIS — R931 Abnormal findings on diagnostic imaging of heart and coronary circulation: Secondary | ICD-10-CM | POA: Insufficient documentation

## 2024-07-24 LAB — SURGICAL PATHOLOGY

## 2024-07-24 NOTE — Progress Notes (Signed)
 " Chief Complaint  Patient presents with   Annual Exam    Nonfasting annual exam. The lipid lowering supplement is red yeast rice with coq10-she will message a picture or specific amounts. Not taking prometrium, can't take makes her crazy. Declined all vaccines. Has been fatigued lately.    Lauren Blanchard is a 54 y.o. female who presents for a complete physical.  She sees Dr. Lilton for GYN care, last seen in 02/2024. She is on estrogen patch through GYN.  Progesterone was added in August to see if it helped with her sleep issues. It helped with her sleep, but it made her very emotional. She only took it for 2 nights.   Anxiety, insomnia: She quit her job in 10/2022 in order to help her daughter. She still helps out in the office 3 days/week.  Daughter is no longer getting ECT, now on clozapine.  Things have been status quo. She will be taking her for vocational rehab (assessment later today), which is going to cause upheaval, as her daughter cannot drive. This is worrying her a lot today. I'm not good with change.  Sleep--Some nights are better than others, wakes up at least once but can usually get back to sleep (unless something is bothering her). She previously was prescribed alprazolam  to use sparingly.  She uses this mainly for sleep.  She got #10 filled last 06/17/24, prior fills were in August, February, 05/2023 and 01/2023. She last filled it related to her colonoscopy--but didn't take it and BP was high.  She took 1/2 pill once since she filled it. She is using a Wellam[employer]? Patch plus melatonin, and she has been sleeping okay. She will get us  these supplements so we know what they contain.  She previously wasn't able to tolerate Pristiq --caused insomnia if taken at night, and caused drowsiness and nausea when taken during the day. She felt miserable, and stopped it. 06/2022 we switched to low dose duloxetine  (20mg ). Wasn't able to handle the nausea (started on day 3, though she did  report feeling better, felt the fog lifting), along with working and dealing with her daughter.     Sinus headaches (frontal): She is s/p sinus surgeries in the past, and sees ENT (Dr. Georgina) regularly.  She last saw ENT last month with  more sinus headaches--sinuses appeared normal. ?if related to rebound from years of decongestants. She also sees neurologist, felt her frontal headaches were a variant of migraine. She was prescribed topamax, which had worked well.  She went back to neuro last month, having more headaches, and issues with dizziness since the summer. 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 She had been using scopolamine  patches and meclizine , still had some symptoms. 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 She was advised to continue on topamax,and imitrex to be used prn. MRI of brain was ordered. Today she states that her vertigo and headaches have been better . Hasn't needed to try the imitrex yet.  GERD, h/o epigastric pain:  She takes OTC prevacid  daily, and is doing well. In the past, when taking a lot of ibuprofen  related to headaches, she had worsening symptoms (nausea, epigastric pain, belching), and required prescription course.  She hasn't had any of these issues in a while.   IBS with constipation--She uses peri-colace nightly, Amitiza  and Balance of Nature. She has been having raisin bran cereal daily, and her bowels have been controlled on this regimen (daily bowel movements.). Sensitive to the constipation side effects of medications (  ie mucinex).   Vit D deficiency:  Last level was 71 in 04/2022 when taking 3000 IU daily. She is now taking a different supplement, D3K2, unsure how many units.  Hyperlipidemia:  Cholesterol has been up and down, always with good HDL.  She doesn't eat red meat or eggs, has some cheese (not every day). Uses 1% milk. Has occasional creamy soups (less than previous). Eats chicken and turkey, no beef, pork or lamb, doesn't  eat fish. Puts butter on toast (not often), broccoli, cut back on it. Uses olive oil. She is due for recheck.  03/2024 Ca score 27.9 (89%ile), declined statin. She preferred to take natural medicine)--a heart healthy supplement set sold by [employer]. She reports this has red yeast rice and coenzyme Q10.  Recheck of lipids last month were improved. She was advised LDL was still >100, and statin was still the recommended treatment.  She states she has only been on this supplement for about a month.  Component Ref Range & Units (hover) 1 mo ago 4 mo ago 1 yr ago 2 yr ago 3 yr ago 4 yr ago 5 yr ago  Cholesterol, Total 192 213 High  252 High  205 High  232 High  260 High  199  Triglycerides 104 121 218 High  126 93 136 94  HDL 70 58 64 66 64 75 65  VLDL Cholesterol Cal 18 21 39 22 16 24 17   LDL Chol Calc (NIH) 104 High  134 High  149 High  117 High  152 High  161 High  117 High   Chol/HDL Ratio 2.7 3.7 CM 3.9 CM 3.1 CM 3.6 CM 3.5 CM 3.1 CM    Cold sores--gets 1-2x/year. Uses Valtrex  prn.  She takes it as soon as she feels the tingle, and never actually gets a cold sore.  This past year she had an outbreak where the medication didn't work, thinks the medicine was too old.  She got a refill, and that was effective.   Immunization History  Administered Date(s) Administered   Influenza Split 05/24/2012   Influenza,inj,Quad PF,6+ Mos 05/01/2013, 05/15/2019   MMR 06/07/2010, 07/08/2010   Td 05/24/2010   Tdap 09/03/2014   She declines COVID vaccine, has religious exemption.  Refuses flu shot. Last Pap smear: 10/2016, benign, no high risk HPV present; s/p hysterectomy Last mammogram: 03/2024 (result not in chart) Last colonoscopy: 07/2024 3 polyps (2 tubular adenomas, 1 hyperplastic), int/ext hemorrhoids.  3 yr f/u recommended.  (Prior was 06/2021 5 polyps--tubular adenomas, SSP (and hyperplastic)) Last DEXA: never   Dentist: yearly Ophtho:  not recently Exercise:  3x/week--30 mins of cardio  (fast-walking on treadmill, with arm weights), 35-40 minutes of weights.  Keeps heart rate up with her weights.  PMH, PSH, SH and FH were reviewed and updated  Outpatient Encounter Medications as of 07/25/2024  Medication Sig Note   acetaminophen  (TYLENOL ) 325 MG tablet Take 650 mg by mouth every 6 (six) hours as needed. 07/25/2024: Took 2 this am   ALPRAZolam  (XANAX ) 0.5 MG tablet TAKE 1/2-1 TABLET (0.25-0.5MG ) BY MOUTH 3 (THREE) TIMES DAILY AS NEEDED FOR ANXIETY OR SLEEP. 07/25/2024: As needed, last dose 2 weeks ago   Ascorbic Acid (VITAMIN C) 1000 MG tablet Take 2,000 mg by mouth daily.    azelastine (ASTELIN) 0.1 % nasal spray Place 1 spray into both nostrils daily at 6 (six) AM.    azelastine (OPTIVAR) 0.05 % ophthalmic solution Apply 1 drop to eye 2 (two) times daily.  b complex vitamins capsule Take 2 capsules by mouth daily.    BIOTIN PO Take 10,000 mg by mouth daily.    cholecalciferol (VITAMIN D ) 1000 UNITS tablet Take 3,000 Units by mouth daily. 07/25/2024: D3 plus K2, unsure of dose   diphenhydrAMINE  (BENADRYL  ALLERGY) 25 mg capsule 25 mg. 07/25/2024: As needed   estradiol (ESTRACE) 0.1 MG/GM vaginal cream Place 1 Applicatorful vaginally once a week. 02/26/2024: Twice weekly    estradiol (VIVELLE-DOT) 0.075 MG/24HR Place 1 patch onto the skin 2 (two) times a week.    fexofenadine-pseudoephedrine (ALLEGRA-D 24) 180-240 MG per 24 hr tablet Take 1 tablet by mouth daily.    lansoprazole  (PREVACID ) 30 MG capsule Take 1 capsule (30 mg total) by mouth daily before breakfast.    lubiprostone  (AMITIZA ) 8 MCG capsule TAKE 1 CAPSULE TWICE A DAY WITH MEALS    Melatonin 3 MG CAPS Take 12 mg by mouth at bedtime.    NON FORMULARY 30 mg by Infiltration route as needed. Uses via irrigation prn purulent nasal drainage 01/23/2023: saline   NONFORMULARY OR COMPOUNDED ITEM Take 6 capsules by mouth daily. 01/23/2023: Balance of Nature Fruits and Veggie Vitamins (3 fruits and 3 Veggies per daily)    ondansetron  (ZOFRAN -ODT) 4 MG disintegrating tablet Take 1 tablet (4 mg total) by mouth every 8 (eight) hours as needed for nausea or vomiting. 07/25/2024: As needed, rarely   promethazine  (PHENERGAN ) 25 MG tablet Take 1 tablet (25 mg total) by mouth every 6 (six) hours as needed for nausea or vomiting. 07/25/2024: As needed   scopolamine  (TRANSDERM SCOP , 1.5 MG,) 1 MG/3DAYS Place 1 patch (1 mg total) onto the skin every 3 (three) days.    sennosides-docusate sodium  (SENOKOT-S) 8.6-50 MG tablet Take 1 tablet by mouth daily. 07/25/2024: 4-5 per night   TOPAMAX 25 MG tablet Take 50 mg by mouth daily.    valACYclovir  (VALTREX ) 1000 MG tablet TAKE 2 TABLETS AT ONSET OF COLD SORE, REPEAT ONCE IN 12 HOURS (4 TABLETS A COURSE) 07/25/2024: As needed   vitamin E 1000 UNIT capsule Take 1,000 Units by mouth daily.    [DISCONTINUED] zinc gluconate 50 MG tablet Take 50 mg by mouth daily.    EVENING PRIMROSE OIL PO Take 300 mg by mouth daily at 6 (six) AM. (Patient not taking: Reported on 07/25/2024) 07/25/2024: Ran out   TURMERIC CURCUMIN PO Take 3 capsules by mouth daily at 6 (six) AM. (Patient not taking: Reported on 07/25/2024)    [DISCONTINUED] Vitamin K 200 MCG/0.2ML LIQD Vitamin K    No facility-administered encounter medications on file as of 07/25/2024.   Allergies  Allergen Reactions   Molds & Smuts Cough and Other (See Comments)   Short Ragweed Pollen Ext Other (See Comments)    eye redness, headache   Sulfa Antibiotics Itching, Other (See Comments) and Rash   Nitrofurantoin  Other (See Comments) and Itching    rash    ROS: The patient denies anorexia, fever, headaches, decreased hearing, breast concerns, chest pain, palpitations, dizziness, syncope, dyspnea on exertion, cough, swelling, vomiting, diarrhea, melena, hematochezia, hematuria, dysuria, vaginal discharge, odor or itch, genital lesions, numbness, tingling, weakness, tremor, suspicious skin lesions, abnormal bleeding/bruising, or enlarged lymph  nodes.   Denies any urinary symptoms. + constipation, chronic, and controlled per HPI Chronic tinnitus (both ears), intermittent.  Vertigo is improved, only occasionally notices equilibrium is off with certain head movements (short-lived) Some hot flashes. On HRT, doing well. Some intermittent pain in her knees, L worse than R (stopped  doing elliptical, notices pain when going down stairs).  Heartburn is controlled by daily OTC prevacid .  Depression/anxiety per HPI--very anxious today about meeting this afternoon with voc rehab. Insomnia is controlled with OTC measures.   PHYSICAL EXAM:  BP 120/60   Pulse 72   Ht 5' 5 (1.651 m)   Wt 154 lb 12.8 oz (70.2 kg)   LMP 01/10/2013   BMI 25.76 kg/m   Wt Readings from Last 3 Encounters:  07/25/24 154 lb 12.8 oz (70.2 kg)  07/19/24 151 lb 12.8 oz (68.9 kg)  06/03/24 151 lb 8 oz (68.7 kg)   02/2024 145 lb 3.2 oz  07/10/23 148 lb (67.1 kg)  06/28/23 154 lb (69.9 kg)  01/23/23 154 lb (69.9 kg)    General Appearance:    Alert, cooperative, no distress, appears stated age.   Head:    Normocephalic, without obvious abnormality, atraumatic.   Eyes:    PERRL, conjunctiva/corneas clear, EOM's intact, fundi benign     Ears:    Normal TM's and EAC's  Nose:    No purulent drainage, no erythema of mucosa.   Throat:    Normal, no erythema or lesions  Neck:    Supple, no lymphadenopathy; thyroid: no enlargement/tenderness/nodules; no carotid bruit or JVD. No spinal tenderness.  Back:    Spine nontender, no curvature, ROM normal, no CVA tenderness     Lungs:    Clear to auscultation bilaterally without wheezes, rales or ronchi; respirations unlabored     Chest Wall:    No tenderness or deformity     Heart:    Regular rate and rhythm, S1 and S2 normal, no murmur, rub or gallop     Breast Exam:    Deferred to GYN     Abdomen:    Soft, nondistended, normoactive bowel sounds, no hepatosplenomegaly or mass.  Nontender.  Genitalia:    Deferred to GYN             Extremities:    No clubbing, cyanosis or edema     Pulses:    2+ and symmetric all extremities     Skin:    Skin color, texture, turgor normal, no rashes, though exam is limited, not undressed.  Lymph nodes:    Cervical, supraclavicular nodes normal     Neurologic:    Normal strength, sensation and gait; reflexes 2+ and symmetric throughout                       Psych:   Normal mood, affect, hygiene and grooming.       07/25/2024    9:59 AM 07/10/2023    1:43 PM 05/23/2022   11:46 AM 06/16/2021   10:03 AM 06/08/2020    9:41 AM  Depression screen PHQ 2/9  Decreased Interest 0 1 3 1  0  Down, Depressed, Hopeless 1 1 3 1  0  PHQ - 2 Score 1 2 6 2  0  Altered sleeping 1 2 3 1    Tired, decreased energy 2 1 3 3    Change in appetite 1 2 3 1    Feeling bad or failure about yourself  0 0 1 3   Trouble concentrating 1 0 3 0   Moving slowly or fidgety/restless 0 0 0 0   Suicidal thoughts 0 0 0 0   PHQ-9 Score 6 7  19  10     Difficult doing work/chores Not difficult at all Not difficult at all Very difficult Somewhat difficult  Data saved with a previous flowsheet row definition      07/25/2024   10:01 AM 07/10/2023    1:47 PM 05/23/2022   11:44 AM  GAD 7 : Generalized Anxiety Score  Nervous, Anxious, on Edge 1 1 1   Control/stop worrying 1 1 1   Worry too much - different things 1 2 3   Trouble relaxing 1 1 3   Restless 0 0 0  Easily annoyed or irritable 1 1 3   Afraid - awful might happen 3 1 3   Total GAD 7 Score 8 7 14   Anxiety Difficulty Not difficult at all Not difficult at all Somewhat difficult    ASSESSMENT/PLAN:  Annual physical exam - Plan: CBC with Differential/Platelet, Comprehensive metabolic panel with GFR  Irritable bowel syndrome with constipation - bowels are controlled with current supplement/med regimen and raisin bran daily  Gastroesophageal reflux disease without esophagitis - controlled with OTC dose of prevacid  daily  Elevated coronary artery  calcium score - discussed goal LDL <70. Declines statin--wants to give her RYR supplement more time. Diet reviewed - Plan: Lipid panel  Hypercholesteremia - Improved with RYR supplement, but above goal. Reviewed diet. Recheck 3 mos (take max dose of supplement), may need Rx statin for goal <70 - Plan: Lipid panel  Herpes labialis - flares with stress, some recently.  Valtrex  is effective (old rx was less effective)  Anxiety and depression - Hasn't tolerated meds.  Has support from church. Discussed NAMI, counseling; counseled some.  Doing okay overall  Stress due to illness of family member  Insomnia, unspecified type - managed with current supplements--she will get us  info about patches and dose of melatonin. Counseled some  Medication monitoring encounter - Plan: CBC with Differential/Platelet, Comprehensive metabolic panel with GFR, VITAMIN D  25 Hydroxy (Vit-D Deficiency, Fractures)  Vitamin D  deficiency - unsure of current dose, will recheck level to ensure getting adequate amount of D3 - Plan: VITAMIN D  25 Hydroxy (Vit-D Deficiency, Fractures)   Discussed monthly self breast exams and yearly mammograms; at least 30 minutes of aerobic activity at least 5 days/week, weight-bearing exercise 2-3x/week; proper sunscreen use reviewed; healthy diet, including goals of calcium and vitamin D  intake and alcohol recommendations (less than or equal to 1 drink/day) reviewed; regular seatbelt use; changing batteries in smoke detectors. Immunization recommendations discussed-- she declines flu and COVID vaccines. Tdap booster recommended, declined. Prevnar-20 recommended/discussed, declined. Shingrix recommended, risks/SE reviewed, to schedule NV if/when desired, vs get from pharmacy. Colonoscopy is UTD, due again 07/2027  F/u 1 year for CPE, sooner prn.  "

## 2024-07-24 NOTE — Patient Instructions (Addendum)
" °  HEALTH MAINTENANCE RECOMMENDATIONS:  It is recommended that you get at least 30 minutes of aerobic exercise at least 5 days/week (for weight loss, you may need as much as 60-90 minutes). This can be any activity that gets your heart rate up. This can be divided in 10-15 minute intervals if needed, but try and build up your endurance at least once a week.  Weight bearing exercise is also recommended twice weekly.  Eat a healthy diet with lots of vegetables, fruits and fiber.  Colorful foods have a lot of vitamins (ie green vegetables, tomatoes, red peppers, etc).  Limit sweet tea, regular sodas and alcoholic beverages, all of which has a lot of calories and sugar.  Up to 1 alcoholic drink daily may be beneficial for women (unless trying to lose weight, watch sugars).  Drink a lot of water .  Calcium recommendations are 1200-1500 mg daily (1500 mg for postmenopausal women or women without ovaries), and vitamin D  1000 IU daily.  This should be obtained from diet and/or supplements (vitamins), and calcium should not be taken all at once, but in divided doses.  Monthly self breast exams and yearly mammograms for women over the age of 56 is recommended.  Sunscreen of at least SPF 30 should be used on all sun-exposed parts of the skin when outside between the hours of 10 am and 4 pm (not just when at beach or pool, but even with exercise, golf, tennis, and yard work!)  Use a sunscreen that says broad spectrum so it covers both UVA and UVB rays, and make sure to reapply every 1-2 hours.  Remember to change the batteries in your smoke detectors when changing your clock times in the spring and fall. Carbon monoxide detectors are recommended for your home.  Use your seat belt every time you are in a car, and please drive safely and not be distracted with cell phones and texting while driving.  You are due for a tetanus booster (TdaP). This is recommended every 10 years, and your last one was in 08/2024.   You can schedule a nurse visit here, or get this from the pharmacy, if you change your mind.  Pneumonia vaccine with either Prevnar-20 or Capvaxive-21 is also now recommended.  This used to be recommended for people age 43 and up, but guidelines changed last year, and it is now recommended for everybody ages 56 and up.  You can get this from our office or from the pharmacy.  Yearly flu shots are also recommended--it is not covering the A strain well this year, but still helps prevent severe illness, hospitalization and death from influenza.  I recommend getting the new shingles vaccine (Shingrix).  This is a series of 2 injections, spaced 2 months apart. This should be separated from other vaccines by at least 2 weeks.  Please take the full dose (what is recommended on the bottle/box) of the red yeast rice supplement.  We will recheck your cholesterol in 3 months.  Goal LDL is <70.  I encourage you to look into NAMI, support groups.  Routine eye exam is recommended.  Please be sure to send us  the information about your patches that you use for sleep, and the doses of the D3-K2 "

## 2024-07-25 ENCOUNTER — Ambulatory Visit: Payer: Self-pay | Admitting: Gastroenterology

## 2024-07-25 ENCOUNTER — Encounter: Payer: Self-pay | Admitting: Family Medicine

## 2024-07-25 ENCOUNTER — Ambulatory Visit: Admitting: Family Medicine

## 2024-07-25 VITALS — BP 120/60 | HR 72 | Ht 65.0 in | Wt 154.8 lb

## 2024-07-25 DIAGNOSIS — K581 Irritable bowel syndrome with constipation: Secondary | ICD-10-CM | POA: Diagnosis not present

## 2024-07-25 DIAGNOSIS — Z0000 Encounter for general adult medical examination without abnormal findings: Secondary | ICD-10-CM | POA: Diagnosis not present

## 2024-07-25 DIAGNOSIS — E559 Vitamin D deficiency, unspecified: Secondary | ICD-10-CM | POA: Diagnosis not present

## 2024-07-25 DIAGNOSIS — E7800 Pure hypercholesterolemia, unspecified: Secondary | ICD-10-CM | POA: Diagnosis not present

## 2024-07-25 DIAGNOSIS — K219 Gastro-esophageal reflux disease without esophagitis: Secondary | ICD-10-CM

## 2024-07-25 DIAGNOSIS — F32A Depression, unspecified: Secondary | ICD-10-CM

## 2024-07-25 DIAGNOSIS — Z6379 Other stressful life events affecting family and household: Secondary | ICD-10-CM

## 2024-07-25 DIAGNOSIS — Z5181 Encounter for therapeutic drug level monitoring: Secondary | ICD-10-CM

## 2024-07-25 DIAGNOSIS — G4700 Insomnia, unspecified: Secondary | ICD-10-CM

## 2024-07-25 DIAGNOSIS — B001 Herpesviral vesicular dermatitis: Secondary | ICD-10-CM | POA: Diagnosis not present

## 2024-07-25 DIAGNOSIS — F419 Anxiety disorder, unspecified: Secondary | ICD-10-CM | POA: Diagnosis not present

## 2024-07-25 DIAGNOSIS — R931 Abnormal findings on diagnostic imaging of heart and coronary circulation: Secondary | ICD-10-CM

## 2024-07-26 ENCOUNTER — Ambulatory Visit: Payer: Self-pay | Admitting: Family Medicine

## 2024-07-26 LAB — CBC WITH DIFFERENTIAL/PLATELET
Basophils Absolute: 0 x10E3/uL (ref 0.0–0.2)
Basos: 1 %
EOS (ABSOLUTE): 0.2 x10E3/uL (ref 0.0–0.4)
Eos: 2 %
Hematocrit: 38.4 % (ref 34.0–46.6)
Hemoglobin: 12.5 g/dL (ref 11.1–15.9)
Immature Grans (Abs): 0 x10E3/uL (ref 0.0–0.1)
Immature Granulocytes: 0 %
Lymphocytes Absolute: 2 x10E3/uL (ref 0.7–3.1)
Lymphs: 24 %
MCH: 31.6 pg (ref 26.6–33.0)
MCHC: 32.6 g/dL (ref 31.5–35.7)
MCV: 97 fL (ref 79–97)
Monocytes Absolute: 0.7 x10E3/uL (ref 0.1–0.9)
Monocytes: 9 %
Neutrophils Absolute: 5.3 x10E3/uL (ref 1.4–7.0)
Neutrophils: 64 %
Platelets: 322 x10E3/uL (ref 150–450)
RBC: 3.96 x10E6/uL (ref 3.77–5.28)
RDW: 12.6 % (ref 11.7–15.4)
WBC: 8.2 x10E3/uL (ref 3.4–10.8)

## 2024-07-26 LAB — COMPREHENSIVE METABOLIC PANEL WITH GFR
ALT: 23 IU/L (ref 0–32)
AST: 17 IU/L (ref 0–40)
Albumin: 4.5 g/dL (ref 3.8–4.9)
Alkaline Phosphatase: 69 IU/L (ref 49–135)
BUN/Creatinine Ratio: 33 — ABNORMAL HIGH (ref 9–23)
BUN: 23 mg/dL (ref 6–24)
Bilirubin Total: 0.3 mg/dL (ref 0.0–1.2)
CO2: 22 mmol/L (ref 20–29)
Calcium: 9.4 mg/dL (ref 8.7–10.2)
Chloride: 106 mmol/L (ref 96–106)
Creatinine, Ser: 0.7 mg/dL (ref 0.57–1.00)
Globulin, Total: 2.1 g/dL (ref 1.5–4.5)
Glucose: 78 mg/dL (ref 70–99)
Potassium: 4.5 mmol/L (ref 3.5–5.2)
Sodium: 141 mmol/L (ref 134–144)
Total Protein: 6.6 g/dL (ref 6.0–8.5)
eGFR: 102 mL/min/1.73

## 2024-07-26 LAB — VITAMIN D 25 HYDROXY (VIT D DEFICIENCY, FRACTURES): Vit D, 25-Hydroxy: 47.3 ng/mL (ref 30.0–100.0)

## 2024-07-29 ENCOUNTER — Encounter: Payer: Self-pay | Admitting: *Deleted

## 2024-10-23 ENCOUNTER — Other Ambulatory Visit

## 2025-08-13 ENCOUNTER — Encounter: Admitting: Family Medicine
